# Patient Record
Sex: Female | Born: 1940
Health system: Southern US, Community
[De-identification: ages and names within clinical notes are randomized; demographics above are authoritative.]

## PROBLEM LIST (undated history)

## (undated) DIAGNOSIS — E0590 Thyrotoxicosis, unspecified without thyrotoxic crisis or storm: Secondary | ICD-10-CM

## (undated) DIAGNOSIS — M1990 Unspecified osteoarthritis, unspecified site: Secondary | ICD-10-CM

## (undated) DIAGNOSIS — I5020 Unspecified systolic (congestive) heart failure: Secondary | ICD-10-CM

## (undated) DIAGNOSIS — I4891 Unspecified atrial fibrillation: Secondary | ICD-10-CM

## (undated) DIAGNOSIS — E785 Hyperlipidemia, unspecified: Secondary | ICD-10-CM

## (undated) DIAGNOSIS — E669 Obesity, unspecified: Secondary | ICD-10-CM

## (undated) DIAGNOSIS — K219 Gastro-esophageal reflux disease without esophagitis: Secondary | ICD-10-CM

## (undated) DIAGNOSIS — C50919 Malignant neoplasm of unspecified site of unspecified female breast: Secondary | ICD-10-CM

## (undated) DIAGNOSIS — H269 Unspecified cataract: Secondary | ICD-10-CM

## (undated) DIAGNOSIS — I10 Essential (primary) hypertension: Secondary | ICD-10-CM

## (undated) HISTORY — DX: Unspecified cataract: H26.9

## (undated) HISTORY — DX: Thyrotoxicosis, unspecified without thyrotoxic crisis or storm: E05.90

## (undated) HISTORY — DX: Obesity, unspecified: E66.9

## (undated) HISTORY — DX: Essential (primary) hypertension: I10

## (undated) HISTORY — DX: Unspecified osteoarthritis, unspecified site: M19.90

## (undated) HISTORY — DX: Malignant neoplasm of unspecified site of unspecified female breast: C50.919

## (undated) HISTORY — DX: Gastro-esophageal reflux disease without esophagitis: K21.9

## (undated) HISTORY — DX: Hyperlipidemia, unspecified: E78.5

---
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
## 2005-08-09 HISTORY — PX: REPLACEMENT TOTAL KNEE: SUR1224

## 2006-07-07 ENCOUNTER — Inpatient Hospital Stay (HOSPITAL_COMMUNITY): Admission: RE | Admit: 2006-07-07 | Discharge: 2006-07-10 | Payer: Self-pay | Admitting: Specialist

## 2008-07-09 HISTORY — PX: BREAST LUMPECTOMY: SHX2

## 2008-07-09 HISTORY — PX: MASTECTOMY, PARTIAL: SHX709

## 2008-07-15 ENCOUNTER — Encounter: Admission: RE | Admit: 2008-07-15 | Discharge: 2008-07-15 | Payer: Self-pay | Admitting: Family Medicine

## 2008-07-23 ENCOUNTER — Encounter: Admission: RE | Admit: 2008-07-23 | Discharge: 2008-07-23 | Payer: Self-pay | Admitting: Family Medicine

## 2008-08-22 ENCOUNTER — Ambulatory Visit: Admission: RE | Admit: 2008-08-22 | Discharge: 2008-10-17 | Payer: Self-pay | Admitting: Radiation Oncology

## 2010-08-30 ENCOUNTER — Encounter: Payer: Self-pay | Admitting: Family Medicine

## 2010-12-25 NOTE — Op Note (Signed)
NAMESALOMA, CADENA                ACCOUNT NO.:  [id number]   MEDICAL RECORD NO.:  [id number]          PATIENT TYPE:  INP   LOCATION:  0007                         FACILITY:  [facility]   PHYSICIAN:  [name], M.D.DATE OF BIRTH:  06-Oct-1940   DATE OF PROCEDURE:  07/07/2006  DATE OF DISCHARGE:                               OPERATIVE REPORT   INDICATION FOR PROCEDURE:  [age]-year-old female with right knee  pain and dysfunction, not responding to conservative treatment are the  indications for the patient's procedure and anesthesia being required.   PREOPERATIVE DIAGNOSIS:  Right knee end-stage osteoarthritis.   POSTOPERATIVE DIAGNOSIS:  Right knee end-stage osteoarthritis.   PROCEDURE:  Right total knee arthroplasty.   SURGEON:  [name], M.D.   ASSISTANT:  Jaquelyn Bitter. Chabon, P.A.-C   ANESTHESIA:  Spinal with Duramorph.   ESTIMATED BLOOD LOSS:  Less than 100 mL.   DRAINS:  None.   COMPLICATIONS:  None.   TOURNIQUET TIME:  An hour and 30 minutes at 350 mmHg.   COMPLICATIONS:  None.   DISPOSITION:  To PACU, stable.   OPERATIVE DETAILS:  The patient was counseled in the holding area and  correct side was identified.  IV was started and antibiotics were given.  She was taken to the OR, where the spinal anesthetic was administered.  Foley catheter was placed utilizing sterile technique by the OR  circulating nurse.  All extremities were well-padded and bumped and  right knee examined, 5-degree flexion contraction, flexion to 110  degree, morbidly obese with difficulty in flexing the knee past 110  degrees secondary to her body habitus.  She was elevated.  She was  prepped with DuraPrep and draped in sterile fashion.  The same was  esmarched and tourniquet was inflated to 350 mmHg due to the large body  size.  A straight midline incision was made through the skin and  subcutaneous tissue and subcutaneous flaps were developed.  A medial  parapatellar arthrotomy was performed.  Proximal and medial soft tissue  were released.  There was a varus malalignment.  Knee was flex.  Patella  was not everted; it was retracted out of the way.  End-stage arthritis,  bone-against-bone contact.  Cruciate ligaments were resected.  Starter  hole was made and femoral canal was irrigated until effluent was clear.  Intramedullary rod was gently placed.  I chose a 5-degree valgus cut  with an 11-mm cut off the distal femur to reflect the contracture.  The  distal femur was found to be a size #4.  Rotation marks were set and the  distal femoral cutting block was applied.  Medial and lateral menisci  were removed under direct visualization.  Geniculate vessels were  coagulated.  Posterior neurovascular structures were thought of and  protected throughout the entire case.  Again, cruciate ligaments had  been resected.  Tibial eminence was resected and osteophytes were  removed.  The proximal tibia was found to be a size 4.  Central aspect  was noted.  Reamer was utilized with step reamer, then the canal was  irrigated till  the effluent was clear.  The intramedullary rod was  gently placed.  I chose a 0-degree slope and a 10-mm cut, based upon the  lateral size, which was the least efficient.  Posteromedial and  posterolateral femoral osteophytes were removed under direct  visualization.  At this time with flexion and extension blocks, we had  well-balanced flexion and extension gaps.  Tibial baseplate was applied  and rotation coverage was set.  Reamer and punch were utilized.  Femoral  box cut was then prepared in standard fashion.  At this time a size 4  femur, size 4 tibia and 12.5 insert with good range of motion and soft  tissue balance with flexion and extension.  The patella was found to be  a size 32.  Appropriate amount of bone was resected, locking holes were  made and patella was applied.  Patella tracking was anatomic.  All  trials  were now removed.  Knee was irrigated with pulsatile lavage.  Using modern cement technique, all components were cemented into place,  a size 4 femur, size 4 tibia, 12.5 trial insert, 32 patella.  Cement had  cured and excess cement was removed.  We did a 15-mm trial and a 15-mm  posterior-stabilized rotating platform tibial insert with excellent  range of motion and soft tissue balance.  Trial was then removed and the  knee was then again irrigated.  Excess cement was removed and again  copiously irrigated and a final rotating platform, posterior-stabilized  15-mm tibial insert was implanted.  We now had a well-balanced, well-  aligned knee with patellofemoral tracking, anatomic.  A medium Hemovac  drains were placed.  A sequential closure of the layers was done.  The  arthrotomy was closed with Vicryl, subcu closed with 2 layers of Vicryl  due to her large body habitus and deep adipose tissue and skin closed  with subcu Monocryl sutures.  Steri-Strips were applied, drain hooked to  suction, sterile dressings applied and tourniquet deflated.  At the end  of the case, the patient had normal circulation to the foot and ankle.  Ice pack and knee immobilizer were applied with full extension.  She  tolerated surgery well with no complications.  Sponge and needle counts  were corrected.  She was taken from the operating room in stable  condition.   To decrease surgical time and to help with retraction due to her large  body habitus, the assistance of Mr. Jodene Nam, P.A.-C was needed.           ______________________________  [name], M.D.     RAC/MEDQ  D:  07/07/2006  T:  07/08/2006  Job:  [id number]

## 2010-12-25 NOTE — H&P (Signed)
[name], [name] NO.:  [id number]   MEDICAL RECORD NO.:  [id number]          PATIENT TYPE:   LOCATION:                                 FACILITY:   PHYSICIAN:  Sara Leach, M.D.DATE OF BIRTH:  1941-06-16   DATE OF ADMISSION:  DATE OF DISCHARGE:                                HISTORY & PHYSICAL   CHIEF COMPLAINT:  End-stage osteoarthritis right knee.   BRIEF HISTORY:  This is a 70 year old lady with a history of end-stage  osteoarthritis of her right knee which has failed conservative management to  alleviate her pain.  Due to continued pain and difficulty with activities of  daily living, the patient is now scheduled for total knee arthroplasty of  the right knee.  The surgery, risks, benefits, and aftercare were discussed  in detail with the patient; questions invited and answered.  She has had  medical clearance by her medical doctor who is Dr. Selinda Leach and surgery  to go ahead as scheduled.   PAST MEDICAL HISTORY/ALLERGIES:  Drug allergies none.   CURRENT MEDICATIONS:  1. Glucosamine.  2. Levothyroxine 50 mcg per day.  3. Lisinopril/hydrochlorothiazide 20/12.51 daily.  4. Pepcid AC 10 mg one daily.  5. Tylenol.   MEDICAL ILLNESSES INCLUDE:  1. Hypertension.  2. Hypothyroidism.  3. Reflux.   SURGICAL HISTORY INCLUDES:  A D&C.   FAMILY HISTORY:  Positive for coronary disease, cancer, and hypertension.   SOCIAL HISTORY:  The patient is married.  She is retired.  She does not  smoke and drinks a glass of wine occasionally.   REVIEW OF SYSTEMS:  __________ negative for vision or dizziness.  PULMONARY:  No shortness breath, PND, or orthopnea.  CARDIOVASCULAR: No chest pain or  palpitations.  GI:  Positive for GERD.  Negative for ulcers.  GU: Negative  or urinary tract difficulty.  MUSCULOSKELETAL:  As positive in the HPI.   PHYSICAL EXAM:  VITAL SIGNS:  BP 160/90, respirations 18, pulse 78 and  regular.  GENERAL APPEARANCE:  This  is an obese lady in no acute distress.  HEENT: Head normocephalic.  Nose patent.  Ears patent.  Pupils equal round  and react to light.  Throat without injection.  NECK:  Supple without adenopathy.  Carotids 2+ without bruits.  CHEST:  Clear to auscultation.  No rales or rhonchi.  Respirations 18.  HEART:  Regular rate and rhythm at 78 beats per without murmur.  ABDOMEN:  Soft with active bowel sounds.  No masses or organomegaly.  NEUROLOGIC:  Patient alert and oriented to time, place, and person.  Cranial  nerves II-XII grossly intact.  EXTREMITIES:  Showed the right knee with a 4-degrees flexion contracture  with further flexion to 110 degrees.  Neurovascular status is intact; and  dorsalis pedis and posterior tibialis pulses are 2+.   X-RAYS:  Show end-stage osteoarthritis right knee.   IMPRESSION:  End-stage osteoarthritis right knee.   PLAN:  Total knee arthroplasty right knee.      Sara Leach, P.A.    ______________________________  Sara Leach, M.D.  SJC/MEDQ  D:  06/15/2006  T:  06/15/2006  Job:  [id number]

## 2010-12-25 NOTE — Discharge Summary (Signed)
NAMEARTHURINE, OLEARY                ACCOUNT NO.:  [id number]   MEDICAL RECORD NO.:  [id number]          PATIENT TYPE:  INP   LOCATION:  1508                         FACILITY:  [facility]   PHYSICIAN:  [name], M.D.DATE OF BIRTH:  05/20/1941   DATE OF ADMISSION:  07/07/2006  DATE OF DISCHARGE:  07/10/2006                               DISCHARGE SUMMARY   ADMITTING DIAGNOSIS:  End-stage osteoarthritis right knee.   DISCHARGE DIAGNOSIS:  End-stage osteoarthritis right knee.   OPERATION:  Total knee arthroplasty right knee.   BRIEF HISTORY:  This 70 year old lady with history of end-stage  osteoarthritis which failed conservative management to alleviate her  pain after discussion of treatment, benefits, risks, and options, the  patient now scheduled for total knee arthroplasty. The patient obtained  medical clearance per medical doctor, Dr. [name]. The surgery to  go ahead as scheduled.   LABORATORY VALUES:  Admission CBC within normal limits with the  exception of platelets slightly elevated at 376. Hemoglobin and  hematocrit reached a low of 10 and 28.9 on the second. White count was  mildly elevated at 10.5 on the 30th and went back to normal on the  first. Also on the 30th she had slightly elevated neutrophil count at 85  __________ . Admission PT/PTT within normal limits. Admission CMET  within normal limits. The patient ran some mildly elevated glucose with  a high at 153. Also had slightly low potassium on the second at 2.9.  Urinalysis on the 21st showed a small amount of hemoglobin, cloudy  appearance. Repeat on the 30th showed a trace hemoglobin with a turbid  appearance. There were also many epithelial cells, rare bacteria.   COURSE IN THE HOSPITAL:  The patient tolerated the operative procedure  well. First postoperative day she was feeling okay.  Vital signs were  stable. She was afebrile. Urine output was slightly low and slightly  cloudy. Hemovac  drainage was 350. BMET within normal limits with the  exception of glucose of 153. Drain was removed without difficulty.  Calves were negative. She was given a 250 cc bolus. IV fluids were  increased, and a urine was sent for culture and sensitivity. Second  postoperative day she was doing well. Vital signs were stable. She was  voiding okay. Neurovascular was normal. She was doing well on her  Lovenox. Dressing was changed, and wound was benign.  Hemoglobin/hematocrit remained at acceptable levels. The patient was  doing well in physical therapy, and on December 2 feeling good, eating  well, doing very well in physical therapy with vital signs stable and  afebrile. Wound benign. Calves negative. The patient was subsequently  discharged home for follow-up in the office.   CONDITION ON DISCHARGE:  Improved.   DISCHARGE MEDICATIONS:  1. Lovenox 30 mg subcu b.i.d. for total of 10 days postop.  2. Percocet 1-2 q.4-6 h. p.r.n. pain.  3. Robaxin 500, one p.o. q.8 h. p.r.n. spasm.   Return to the office in two weeks for recheck or call sooner p.r.n.  problems. She is to continue with her home physical therapy  and home  health with Genevieve Norlander on discharge.      Jaquelyn Bitter. Chabon, P.A.    ______________________________  [name], M.D.    SJC/MEDQ  D:  08/18/2006  T:  08/18/2006  Job:  [id number]

## 2012-12-12 ENCOUNTER — Encounter (INDEPENDENT_AMBULATORY_CARE_PROVIDER_SITE_OTHER): Payer: Self-pay | Admitting: Internal Medicine

## 2012-12-12 DIAGNOSIS — Z170 Estrogen receptor positive status [ER+]: Secondary | ICD-10-CM

## 2012-12-12 DIAGNOSIS — C50919 Malignant neoplasm of unspecified site of unspecified female breast: Secondary | ICD-10-CM

## 2013-01-18 ENCOUNTER — Other Ambulatory Visit: Payer: Self-pay | Admitting: *Deleted

## 2013-01-18 NOTE — Telephone Encounter (Signed)
Opened encounter in error  

## 2015-09-30 DIAGNOSIS — C50911 Malignant neoplasm of unspecified site of right female breast: Secondary | ICD-10-CM | POA: Diagnosis not present

## 2015-09-30 DIAGNOSIS — I10 Essential (primary) hypertension: Secondary | ICD-10-CM | POA: Diagnosis not present

## 2015-09-30 DIAGNOSIS — K219 Gastro-esophageal reflux disease without esophagitis: Secondary | ICD-10-CM | POA: Diagnosis not present

## 2015-09-30 DIAGNOSIS — E559 Vitamin D deficiency, unspecified: Secondary | ICD-10-CM | POA: Diagnosis not present

## 2015-09-30 DIAGNOSIS — E039 Hypothyroidism, unspecified: Secondary | ICD-10-CM | POA: Diagnosis not present

## 2015-09-30 DIAGNOSIS — E871 Hypo-osmolality and hyponatremia: Secondary | ICD-10-CM | POA: Diagnosis not present

## 2015-10-06 DIAGNOSIS — Z6841 Body Mass Index (BMI) 40.0 and over, adult: Secondary | ICD-10-CM | POA: Diagnosis not present

## 2015-10-06 DIAGNOSIS — E559 Vitamin D deficiency, unspecified: Secondary | ICD-10-CM | POA: Diagnosis not present

## 2015-10-06 DIAGNOSIS — I10 Essential (primary) hypertension: Secondary | ICD-10-CM | POA: Diagnosis not present

## 2015-10-06 DIAGNOSIS — E871 Hypo-osmolality and hyponatremia: Secondary | ICD-10-CM | POA: Diagnosis not present

## 2015-10-06 DIAGNOSIS — C50911 Malignant neoplasm of unspecified site of right female breast: Secondary | ICD-10-CM | POA: Diagnosis not present

## 2015-10-06 DIAGNOSIS — M179 Osteoarthritis of knee, unspecified: Secondary | ICD-10-CM | POA: Diagnosis not present

## 2015-10-06 DIAGNOSIS — K219 Gastro-esophageal reflux disease without esophagitis: Secondary | ICD-10-CM | POA: Diagnosis not present

## 2015-10-06 DIAGNOSIS — Z96651 Presence of right artificial knee joint: Secondary | ICD-10-CM | POA: Diagnosis not present

## 2015-10-06 DIAGNOSIS — E039 Hypothyroidism, unspecified: Secondary | ICD-10-CM | POA: Diagnosis not present

## 2016-04-05 DIAGNOSIS — M1990 Unspecified osteoarthritis, unspecified site: Secondary | ICD-10-CM | POA: Diagnosis not present

## 2016-04-05 DIAGNOSIS — I10 Essential (primary) hypertension: Secondary | ICD-10-CM | POA: Diagnosis not present

## 2016-04-05 DIAGNOSIS — E871 Hypo-osmolality and hyponatremia: Secondary | ICD-10-CM | POA: Diagnosis not present

## 2016-04-05 DIAGNOSIS — K219 Gastro-esophageal reflux disease without esophagitis: Secondary | ICD-10-CM | POA: Diagnosis not present

## 2016-04-05 DIAGNOSIS — E559 Vitamin D deficiency, unspecified: Secondary | ICD-10-CM | POA: Diagnosis not present

## 2016-04-05 DIAGNOSIS — C50911 Malignant neoplasm of unspecified site of right female breast: Secondary | ICD-10-CM | POA: Diagnosis not present

## 2016-04-05 DIAGNOSIS — E039 Hypothyroidism, unspecified: Secondary | ICD-10-CM | POA: Diagnosis not present

## 2016-04-08 DIAGNOSIS — C50911 Malignant neoplasm of unspecified site of right female breast: Secondary | ICD-10-CM | POA: Diagnosis not present

## 2016-04-08 DIAGNOSIS — M179 Osteoarthritis of knee, unspecified: Secondary | ICD-10-CM | POA: Diagnosis not present

## 2016-04-08 DIAGNOSIS — Z6841 Body Mass Index (BMI) 40.0 and over, adult: Secondary | ICD-10-CM | POA: Diagnosis not present

## 2016-04-08 DIAGNOSIS — K219 Gastro-esophageal reflux disease without esophagitis: Secondary | ICD-10-CM | POA: Diagnosis not present

## 2016-04-08 DIAGNOSIS — Z0001 Encounter for general adult medical examination with abnormal findings: Secondary | ICD-10-CM | POA: Diagnosis not present

## 2016-04-08 DIAGNOSIS — I10 Essential (primary) hypertension: Secondary | ICD-10-CM | POA: Diagnosis not present

## 2016-04-08 DIAGNOSIS — Z23 Encounter for immunization: Secondary | ICD-10-CM | POA: Diagnosis not present

## 2016-04-08 DIAGNOSIS — E039 Hypothyroidism, unspecified: Secondary | ICD-10-CM | POA: Diagnosis not present

## 2016-04-09 DIAGNOSIS — Z923 Personal history of irradiation: Secondary | ICD-10-CM | POA: Diagnosis not present

## 2016-04-09 DIAGNOSIS — Z9889 Other specified postprocedural states: Secondary | ICD-10-CM | POA: Diagnosis not present

## 2016-04-09 DIAGNOSIS — Z1231 Encounter for screening mammogram for malignant neoplasm of breast: Secondary | ICD-10-CM | POA: Diagnosis not present

## 2016-05-05 DIAGNOSIS — Z01419 Encounter for gynecological examination (general) (routine) without abnormal findings: Secondary | ICD-10-CM | POA: Diagnosis not present

## 2016-05-05 DIAGNOSIS — Z6837 Body mass index (BMI) 37.0-37.9, adult: Secondary | ICD-10-CM | POA: Diagnosis not present

## 2016-10-12 DIAGNOSIS — E559 Vitamin D deficiency, unspecified: Secondary | ICD-10-CM | POA: Diagnosis not present

## 2016-10-12 DIAGNOSIS — I10 Essential (primary) hypertension: Secondary | ICD-10-CM | POA: Diagnosis not present

## 2016-10-12 DIAGNOSIS — E871 Hypo-osmolality and hyponatremia: Secondary | ICD-10-CM | POA: Diagnosis not present

## 2016-10-12 DIAGNOSIS — E039 Hypothyroidism, unspecified: Secondary | ICD-10-CM | POA: Diagnosis not present

## 2016-10-14 DIAGNOSIS — C50911 Malignant neoplasm of unspecified site of right female breast: Secondary | ICD-10-CM | POA: Diagnosis not present

## 2016-10-14 DIAGNOSIS — K219 Gastro-esophageal reflux disease without esophagitis: Secondary | ICD-10-CM | POA: Diagnosis not present

## 2016-10-14 DIAGNOSIS — E039 Hypothyroidism, unspecified: Secondary | ICD-10-CM | POA: Diagnosis not present

## 2016-10-14 DIAGNOSIS — I10 Essential (primary) hypertension: Secondary | ICD-10-CM | POA: Diagnosis not present

## 2017-01-13 DIAGNOSIS — J209 Acute bronchitis, unspecified: Secondary | ICD-10-CM | POA: Diagnosis not present

## 2017-01-13 DIAGNOSIS — R05 Cough: Secondary | ICD-10-CM | POA: Diagnosis not present

## 2017-01-13 DIAGNOSIS — R5383 Other fatigue: Secondary | ICD-10-CM | POA: Diagnosis not present

## 2017-01-13 DIAGNOSIS — Z6838 Body mass index (BMI) 38.0-38.9, adult: Secondary | ICD-10-CM | POA: Diagnosis not present

## 2017-01-17 DIAGNOSIS — R5383 Other fatigue: Secondary | ICD-10-CM | POA: Diagnosis not present

## 2017-01-17 DIAGNOSIS — Z6838 Body mass index (BMI) 38.0-38.9, adult: Secondary | ICD-10-CM | POA: Diagnosis not present

## 2017-01-17 DIAGNOSIS — R05 Cough: Secondary | ICD-10-CM | POA: Diagnosis not present

## 2017-01-17 DIAGNOSIS — J209 Acute bronchitis, unspecified: Secondary | ICD-10-CM | POA: Diagnosis not present

## 2017-01-19 DIAGNOSIS — R918 Other nonspecific abnormal finding of lung field: Secondary | ICD-10-CM | POA: Diagnosis not present

## 2017-02-02 DIAGNOSIS — M47816 Spondylosis without myelopathy or radiculopathy, lumbar region: Secondary | ICD-10-CM | POA: Diagnosis not present

## 2017-02-02 DIAGNOSIS — M9903 Segmental and somatic dysfunction of lumbar region: Secondary | ICD-10-CM | POA: Diagnosis not present

## 2017-02-07 DIAGNOSIS — M9903 Segmental and somatic dysfunction of lumbar region: Secondary | ICD-10-CM | POA: Diagnosis not present

## 2017-02-07 DIAGNOSIS — M47816 Spondylosis without myelopathy or radiculopathy, lumbar region: Secondary | ICD-10-CM | POA: Diagnosis not present

## 2017-02-10 DIAGNOSIS — M47816 Spondylosis without myelopathy or radiculopathy, lumbar region: Secondary | ICD-10-CM | POA: Diagnosis not present

## 2017-02-10 DIAGNOSIS — M9903 Segmental and somatic dysfunction of lumbar region: Secondary | ICD-10-CM | POA: Diagnosis not present

## 2017-02-15 DIAGNOSIS — M9903 Segmental and somatic dysfunction of lumbar region: Secondary | ICD-10-CM | POA: Diagnosis not present

## 2017-02-15 DIAGNOSIS — M47816 Spondylosis without myelopathy or radiculopathy, lumbar region: Secondary | ICD-10-CM | POA: Diagnosis not present

## 2017-02-21 DIAGNOSIS — M47816 Spondylosis without myelopathy or radiculopathy, lumbar region: Secondary | ICD-10-CM | POA: Diagnosis not present

## 2017-02-21 DIAGNOSIS — M9903 Segmental and somatic dysfunction of lumbar region: Secondary | ICD-10-CM | POA: Diagnosis not present

## 2017-02-24 DIAGNOSIS — M47816 Spondylosis without myelopathy or radiculopathy, lumbar region: Secondary | ICD-10-CM | POA: Diagnosis not present

## 2017-02-24 DIAGNOSIS — M9903 Segmental and somatic dysfunction of lumbar region: Secondary | ICD-10-CM | POA: Diagnosis not present

## 2017-03-01 DIAGNOSIS — M47816 Spondylosis without myelopathy or radiculopathy, lumbar region: Secondary | ICD-10-CM | POA: Diagnosis not present

## 2017-03-01 DIAGNOSIS — M9903 Segmental and somatic dysfunction of lumbar region: Secondary | ICD-10-CM | POA: Diagnosis not present

## 2017-03-08 DIAGNOSIS — M9903 Segmental and somatic dysfunction of lumbar region: Secondary | ICD-10-CM | POA: Diagnosis not present

## 2017-03-08 DIAGNOSIS — M47816 Spondylosis without myelopathy or radiculopathy, lumbar region: Secondary | ICD-10-CM | POA: Diagnosis not present

## 2017-03-14 DIAGNOSIS — M47816 Spondylosis without myelopathy or radiculopathy, lumbar region: Secondary | ICD-10-CM | POA: Diagnosis not present

## 2017-03-14 DIAGNOSIS — M9903 Segmental and somatic dysfunction of lumbar region: Secondary | ICD-10-CM | POA: Diagnosis not present

## 2017-03-21 DIAGNOSIS — M9903 Segmental and somatic dysfunction of lumbar region: Secondary | ICD-10-CM | POA: Diagnosis not present

## 2017-03-21 DIAGNOSIS — M47816 Spondylosis without myelopathy or radiculopathy, lumbar region: Secondary | ICD-10-CM | POA: Diagnosis not present

## 2017-03-28 DIAGNOSIS — M47816 Spondylosis without myelopathy or radiculopathy, lumbar region: Secondary | ICD-10-CM | POA: Diagnosis not present

## 2017-03-28 DIAGNOSIS — M9903 Segmental and somatic dysfunction of lumbar region: Secondary | ICD-10-CM | POA: Diagnosis not present

## 2017-04-04 DIAGNOSIS — M47816 Spondylosis without myelopathy or radiculopathy, lumbar region: Secondary | ICD-10-CM | POA: Diagnosis not present

## 2017-04-04 DIAGNOSIS — M9903 Segmental and somatic dysfunction of lumbar region: Secondary | ICD-10-CM | POA: Diagnosis not present

## 2017-04-07 DIAGNOSIS — E871 Hypo-osmolality and hyponatremia: Secondary | ICD-10-CM | POA: Diagnosis not present

## 2017-04-07 DIAGNOSIS — C50911 Malignant neoplasm of unspecified site of right female breast: Secondary | ICD-10-CM | POA: Diagnosis not present

## 2017-04-07 DIAGNOSIS — E039 Hypothyroidism, unspecified: Secondary | ICD-10-CM | POA: Diagnosis not present

## 2017-04-07 DIAGNOSIS — E559 Vitamin D deficiency, unspecified: Secondary | ICD-10-CM | POA: Diagnosis not present

## 2017-04-14 DIAGNOSIS — I10 Essential (primary) hypertension: Secondary | ICD-10-CM | POA: Diagnosis not present

## 2017-04-14 DIAGNOSIS — Z0001 Encounter for general adult medical examination with abnormal findings: Secondary | ICD-10-CM | POA: Diagnosis not present

## 2017-04-14 DIAGNOSIS — C50911 Malignant neoplasm of unspecified site of right female breast: Secondary | ICD-10-CM | POA: Diagnosis not present

## 2017-04-14 DIAGNOSIS — E559 Vitamin D deficiency, unspecified: Secondary | ICD-10-CM | POA: Diagnosis not present

## 2017-04-18 DIAGNOSIS — M9903 Segmental and somatic dysfunction of lumbar region: Secondary | ICD-10-CM | POA: Diagnosis not present

## 2017-04-18 DIAGNOSIS — M47816 Spondylosis without myelopathy or radiculopathy, lumbar region: Secondary | ICD-10-CM | POA: Diagnosis not present

## 2017-05-04 DIAGNOSIS — M9903 Segmental and somatic dysfunction of lumbar region: Secondary | ICD-10-CM | POA: Diagnosis not present

## 2017-05-04 DIAGNOSIS — M47816 Spondylosis without myelopathy or radiculopathy, lumbar region: Secondary | ICD-10-CM | POA: Diagnosis not present

## 2017-05-30 DIAGNOSIS — M9903 Segmental and somatic dysfunction of lumbar region: Secondary | ICD-10-CM | POA: Diagnosis not present

## 2017-05-30 DIAGNOSIS — M47816 Spondylosis without myelopathy or radiculopathy, lumbar region: Secondary | ICD-10-CM | POA: Diagnosis not present

## 2017-05-31 DIAGNOSIS — M1712 Unilateral primary osteoarthritis, left knee: Secondary | ICD-10-CM | POA: Diagnosis not present

## 2017-06-13 DIAGNOSIS — Z1231 Encounter for screening mammogram for malignant neoplasm of breast: Secondary | ICD-10-CM | POA: Diagnosis not present

## 2017-06-13 DIAGNOSIS — Z01419 Encounter for gynecological examination (general) (routine) without abnormal findings: Secondary | ICD-10-CM | POA: Diagnosis not present

## 2017-06-13 DIAGNOSIS — Z124 Encounter for screening for malignant neoplasm of cervix: Secondary | ICD-10-CM | POA: Diagnosis not present

## 2017-06-27 DIAGNOSIS — M9903 Segmental and somatic dysfunction of lumbar region: Secondary | ICD-10-CM | POA: Diagnosis not present

## 2017-06-27 DIAGNOSIS — M47816 Spondylosis without myelopathy or radiculopathy, lumbar region: Secondary | ICD-10-CM | POA: Diagnosis not present

## 2017-07-04 DIAGNOSIS — I10 Essential (primary) hypertension: Secondary | ICD-10-CM | POA: Diagnosis not present

## 2017-07-04 DIAGNOSIS — E871 Hypo-osmolality and hyponatremia: Secondary | ICD-10-CM | POA: Diagnosis not present

## 2017-07-04 DIAGNOSIS — Z6835 Body mass index (BMI) 35.0-35.9, adult: Secondary | ICD-10-CM | POA: Diagnosis not present

## 2017-07-06 DIAGNOSIS — M1712 Unilateral primary osteoarthritis, left knee: Secondary | ICD-10-CM | POA: Diagnosis not present

## 2017-07-13 DIAGNOSIS — M1712 Unilateral primary osteoarthritis, left knee: Secondary | ICD-10-CM | POA: Diagnosis not present

## 2017-07-13 DIAGNOSIS — E871 Hypo-osmolality and hyponatremia: Secondary | ICD-10-CM | POA: Diagnosis not present

## 2017-07-13 DIAGNOSIS — E039 Hypothyroidism, unspecified: Secondary | ICD-10-CM | POA: Diagnosis not present

## 2017-07-13 DIAGNOSIS — E559 Vitamin D deficiency, unspecified: Secondary | ICD-10-CM | POA: Diagnosis not present

## 2017-07-13 DIAGNOSIS — C50911 Malignant neoplasm of unspecified site of right female breast: Secondary | ICD-10-CM | POA: Diagnosis not present

## 2017-07-14 DIAGNOSIS — M47816 Spondylosis without myelopathy or radiculopathy, lumbar region: Secondary | ICD-10-CM | POA: Diagnosis not present

## 2017-07-14 DIAGNOSIS — M9903 Segmental and somatic dysfunction of lumbar region: Secondary | ICD-10-CM | POA: Diagnosis not present

## 2017-07-20 DIAGNOSIS — M1712 Unilateral primary osteoarthritis, left knee: Secondary | ICD-10-CM | POA: Diagnosis not present

## 2017-08-08 DIAGNOSIS — Z780 Asymptomatic menopausal state: Secondary | ICD-10-CM | POA: Diagnosis not present

## 2017-10-10 DIAGNOSIS — E039 Hypothyroidism, unspecified: Secondary | ICD-10-CM | POA: Diagnosis not present

## 2017-10-10 DIAGNOSIS — E871 Hypo-osmolality and hyponatremia: Secondary | ICD-10-CM | POA: Diagnosis not present

## 2017-10-10 DIAGNOSIS — C50911 Malignant neoplasm of unspecified site of right female breast: Secondary | ICD-10-CM | POA: Diagnosis not present

## 2017-10-10 DIAGNOSIS — I10 Essential (primary) hypertension: Secondary | ICD-10-CM | POA: Diagnosis not present

## 2017-10-13 DIAGNOSIS — E559 Vitamin D deficiency, unspecified: Secondary | ICD-10-CM | POA: Diagnosis not present

## 2017-10-13 DIAGNOSIS — M179 Osteoarthritis of knee, unspecified: Secondary | ICD-10-CM | POA: Diagnosis not present

## 2017-10-13 DIAGNOSIS — Z96651 Presence of right artificial knee joint: Secondary | ICD-10-CM | POA: Diagnosis not present

## 2017-10-13 DIAGNOSIS — C50911 Malignant neoplasm of unspecified site of right female breast: Secondary | ICD-10-CM | POA: Diagnosis not present

## 2017-12-14 DIAGNOSIS — M170 Bilateral primary osteoarthritis of knee: Secondary | ICD-10-CM | POA: Diagnosis not present

## 2017-12-14 DIAGNOSIS — M1711 Unilateral primary osteoarthritis, right knee: Secondary | ICD-10-CM | POA: Diagnosis not present

## 2017-12-14 DIAGNOSIS — M1712 Unilateral primary osteoarthritis, left knee: Secondary | ICD-10-CM | POA: Diagnosis not present

## 2017-12-28 DIAGNOSIS — M47816 Spondylosis without myelopathy or radiculopathy, lumbar region: Secondary | ICD-10-CM | POA: Diagnosis not present

## 2017-12-28 DIAGNOSIS — M5442 Lumbago with sciatica, left side: Secondary | ICD-10-CM | POA: Diagnosis not present

## 2017-12-28 DIAGNOSIS — M9903 Segmental and somatic dysfunction of lumbar region: Secondary | ICD-10-CM | POA: Diagnosis not present

## 2018-01-04 DIAGNOSIS — M47816 Spondylosis without myelopathy or radiculopathy, lumbar region: Secondary | ICD-10-CM | POA: Diagnosis not present

## 2018-01-04 DIAGNOSIS — M9903 Segmental and somatic dysfunction of lumbar region: Secondary | ICD-10-CM | POA: Diagnosis not present

## 2018-01-04 DIAGNOSIS — M5442 Lumbago with sciatica, left side: Secondary | ICD-10-CM | POA: Diagnosis not present

## 2018-01-09 DIAGNOSIS — E039 Hypothyroidism, unspecified: Secondary | ICD-10-CM | POA: Diagnosis not present

## 2018-01-09 DIAGNOSIS — C50911 Malignant neoplasm of unspecified site of right female breast: Secondary | ICD-10-CM | POA: Diagnosis not present

## 2018-01-09 DIAGNOSIS — M1990 Unspecified osteoarthritis, unspecified site: Secondary | ICD-10-CM | POA: Diagnosis not present

## 2018-01-09 DIAGNOSIS — E876 Hypokalemia: Secondary | ICD-10-CM | POA: Diagnosis not present

## 2018-01-18 DIAGNOSIS — E876 Hypokalemia: Secondary | ICD-10-CM | POA: Diagnosis not present

## 2018-01-18 DIAGNOSIS — E039 Hypothyroidism, unspecified: Secondary | ICD-10-CM | POA: Diagnosis not present

## 2018-01-18 DIAGNOSIS — I10 Essential (primary) hypertension: Secondary | ICD-10-CM | POA: Diagnosis not present

## 2018-01-18 DIAGNOSIS — E871 Hypo-osmolality and hyponatremia: Secondary | ICD-10-CM | POA: Diagnosis not present

## 2018-02-08 DIAGNOSIS — M1712 Unilateral primary osteoarthritis, left knee: Secondary | ICD-10-CM | POA: Diagnosis not present

## 2018-02-15 DIAGNOSIS — M1712 Unilateral primary osteoarthritis, left knee: Secondary | ICD-10-CM | POA: Diagnosis not present

## 2018-02-22 DIAGNOSIS — M1712 Unilateral primary osteoarthritis, left knee: Secondary | ICD-10-CM | POA: Diagnosis not present

## 2018-03-09 DIAGNOSIS — M9903 Segmental and somatic dysfunction of lumbar region: Secondary | ICD-10-CM | POA: Diagnosis not present

## 2018-03-09 DIAGNOSIS — M5442 Lumbago with sciatica, left side: Secondary | ICD-10-CM | POA: Diagnosis not present

## 2018-03-09 DIAGNOSIS — M47816 Spondylosis without myelopathy or radiculopathy, lumbar region: Secondary | ICD-10-CM | POA: Diagnosis not present

## 2018-03-13 DIAGNOSIS — M5442 Lumbago with sciatica, left side: Secondary | ICD-10-CM | POA: Diagnosis not present

## 2018-03-13 DIAGNOSIS — H25813 Combined forms of age-related cataract, bilateral: Secondary | ICD-10-CM | POA: Diagnosis not present

## 2018-03-13 DIAGNOSIS — M9903 Segmental and somatic dysfunction of lumbar region: Secondary | ICD-10-CM | POA: Diagnosis not present

## 2018-03-13 DIAGNOSIS — M47816 Spondylosis without myelopathy or radiculopathy, lumbar region: Secondary | ICD-10-CM | POA: Diagnosis not present

## 2018-03-16 DIAGNOSIS — M5442 Lumbago with sciatica, left side: Secondary | ICD-10-CM | POA: Diagnosis not present

## 2018-03-16 DIAGNOSIS — M9903 Segmental and somatic dysfunction of lumbar region: Secondary | ICD-10-CM | POA: Diagnosis not present

## 2018-03-16 DIAGNOSIS — M47816 Spondylosis without myelopathy or radiculopathy, lumbar region: Secondary | ICD-10-CM | POA: Diagnosis not present

## 2018-03-20 DIAGNOSIS — M5442 Lumbago with sciatica, left side: Secondary | ICD-10-CM | POA: Diagnosis not present

## 2018-03-20 DIAGNOSIS — M47816 Spondylosis without myelopathy or radiculopathy, lumbar region: Secondary | ICD-10-CM | POA: Diagnosis not present

## 2018-03-20 DIAGNOSIS — M9903 Segmental and somatic dysfunction of lumbar region: Secondary | ICD-10-CM | POA: Diagnosis not present

## 2018-03-23 DIAGNOSIS — M5442 Lumbago with sciatica, left side: Secondary | ICD-10-CM | POA: Diagnosis not present

## 2018-03-23 DIAGNOSIS — M9903 Segmental and somatic dysfunction of lumbar region: Secondary | ICD-10-CM | POA: Diagnosis not present

## 2018-03-23 DIAGNOSIS — M47816 Spondylosis without myelopathy or radiculopathy, lumbar region: Secondary | ICD-10-CM | POA: Diagnosis not present

## 2018-03-27 DIAGNOSIS — M5442 Lumbago with sciatica, left side: Secondary | ICD-10-CM | POA: Diagnosis not present

## 2018-03-27 DIAGNOSIS — M9903 Segmental and somatic dysfunction of lumbar region: Secondary | ICD-10-CM | POA: Diagnosis not present

## 2018-03-27 DIAGNOSIS — M47816 Spondylosis without myelopathy or radiculopathy, lumbar region: Secondary | ICD-10-CM | POA: Diagnosis not present

## 2018-04-03 DIAGNOSIS — M47816 Spondylosis without myelopathy or radiculopathy, lumbar region: Secondary | ICD-10-CM | POA: Diagnosis not present

## 2018-04-03 DIAGNOSIS — M9903 Segmental and somatic dysfunction of lumbar region: Secondary | ICD-10-CM | POA: Diagnosis not present

## 2018-04-03 DIAGNOSIS — M5442 Lumbago with sciatica, left side: Secondary | ICD-10-CM | POA: Diagnosis not present

## 2018-04-11 DIAGNOSIS — M5442 Lumbago with sciatica, left side: Secondary | ICD-10-CM | POA: Diagnosis not present

## 2018-04-11 DIAGNOSIS — M47816 Spondylosis without myelopathy or radiculopathy, lumbar region: Secondary | ICD-10-CM | POA: Diagnosis not present

## 2018-04-11 DIAGNOSIS — M9903 Segmental and somatic dysfunction of lumbar region: Secondary | ICD-10-CM | POA: Diagnosis not present

## 2018-04-14 DIAGNOSIS — M47816 Spondylosis without myelopathy or radiculopathy, lumbar region: Secondary | ICD-10-CM | POA: Diagnosis not present

## 2018-04-14 DIAGNOSIS — M5442 Lumbago with sciatica, left side: Secondary | ICD-10-CM | POA: Diagnosis not present

## 2018-04-14 DIAGNOSIS — M9903 Segmental and somatic dysfunction of lumbar region: Secondary | ICD-10-CM | POA: Diagnosis not present

## 2018-05-12 DIAGNOSIS — M47816 Spondylosis without myelopathy or radiculopathy, lumbar region: Secondary | ICD-10-CM | POA: Diagnosis not present

## 2018-05-12 DIAGNOSIS — M9903 Segmental and somatic dysfunction of lumbar region: Secondary | ICD-10-CM | POA: Diagnosis not present

## 2018-05-12 DIAGNOSIS — M5442 Lumbago with sciatica, left side: Secondary | ICD-10-CM | POA: Diagnosis not present

## 2018-07-03 DIAGNOSIS — Z1231 Encounter for screening mammogram for malignant neoplasm of breast: Secondary | ICD-10-CM | POA: Diagnosis not present

## 2018-07-07 DIAGNOSIS — E559 Vitamin D deficiency, unspecified: Secondary | ICD-10-CM | POA: Diagnosis not present

## 2018-07-07 DIAGNOSIS — E039 Hypothyroidism, unspecified: Secondary | ICD-10-CM | POA: Diagnosis not present

## 2018-07-07 DIAGNOSIS — R5383 Other fatigue: Secondary | ICD-10-CM | POA: Diagnosis not present

## 2018-07-07 DIAGNOSIS — M179 Osteoarthritis of knee, unspecified: Secondary | ICD-10-CM | POA: Diagnosis not present

## 2018-07-11 DIAGNOSIS — Z0001 Encounter for general adult medical examination with abnormal findings: Secondary | ICD-10-CM | POA: Diagnosis not present

## 2018-07-11 DIAGNOSIS — E039 Hypothyroidism, unspecified: Secondary | ICD-10-CM | POA: Diagnosis not present

## 2018-07-11 DIAGNOSIS — C50911 Malignant neoplasm of unspecified site of right female breast: Secondary | ICD-10-CM | POA: Diagnosis not present

## 2018-07-11 DIAGNOSIS — I10 Essential (primary) hypertension: Secondary | ICD-10-CM | POA: Diagnosis not present

## 2018-10-10 DIAGNOSIS — E871 Hypo-osmolality and hyponatremia: Secondary | ICD-10-CM | POA: Diagnosis not present

## 2018-10-10 DIAGNOSIS — E039 Hypothyroidism, unspecified: Secondary | ICD-10-CM | POA: Diagnosis not present

## 2018-12-25 DIAGNOSIS — H43823 Vitreomacular adhesion, bilateral: Secondary | ICD-10-CM | POA: Diagnosis not present

## 2018-12-25 DIAGNOSIS — H25813 Combined forms of age-related cataract, bilateral: Secondary | ICD-10-CM | POA: Diagnosis not present

## 2019-01-03 DIAGNOSIS — M9903 Segmental and somatic dysfunction of lumbar region: Secondary | ICD-10-CM | POA: Diagnosis not present

## 2019-01-03 DIAGNOSIS — M5442 Lumbago with sciatica, left side: Secondary | ICD-10-CM | POA: Diagnosis not present

## 2019-01-03 DIAGNOSIS — M47816 Spondylosis without myelopathy or radiculopathy, lumbar region: Secondary | ICD-10-CM | POA: Diagnosis not present

## 2019-01-05 DIAGNOSIS — E871 Hypo-osmolality and hyponatremia: Secondary | ICD-10-CM | POA: Diagnosis not present

## 2019-01-05 DIAGNOSIS — K219 Gastro-esophageal reflux disease without esophagitis: Secondary | ICD-10-CM | POA: Diagnosis not present

## 2019-01-05 DIAGNOSIS — E039 Hypothyroidism, unspecified: Secondary | ICD-10-CM | POA: Diagnosis not present

## 2019-01-05 DIAGNOSIS — I10 Essential (primary) hypertension: Secondary | ICD-10-CM | POA: Diagnosis not present

## 2019-01-05 DIAGNOSIS — M1712 Unilateral primary osteoarthritis, left knee: Secondary | ICD-10-CM | POA: Diagnosis not present

## 2019-01-08 DIAGNOSIS — I4891 Unspecified atrial fibrillation: Secondary | ICD-10-CM

## 2019-01-08 DIAGNOSIS — M5442 Lumbago with sciatica, left side: Secondary | ICD-10-CM | POA: Diagnosis not present

## 2019-01-08 DIAGNOSIS — M47816 Spondylosis without myelopathy or radiculopathy, lumbar region: Secondary | ICD-10-CM | POA: Diagnosis not present

## 2019-01-08 DIAGNOSIS — M9903 Segmental and somatic dysfunction of lumbar region: Secondary | ICD-10-CM | POA: Diagnosis not present

## 2019-01-08 DIAGNOSIS — I5020 Unspecified systolic (congestive) heart failure: Secondary | ICD-10-CM

## 2019-01-08 HISTORY — DX: Unspecified systolic (congestive) heart failure: I50.20

## 2019-01-08 HISTORY — DX: Unspecified atrial fibrillation: I48.91

## 2019-01-09 ENCOUNTER — Encounter: Payer: Self-pay | Admitting: Internal Medicine

## 2019-01-09 DIAGNOSIS — M1712 Unilateral primary osteoarthritis, left knee: Secondary | ICD-10-CM | POA: Diagnosis not present

## 2019-01-09 DIAGNOSIS — E039 Hypothyroidism, unspecified: Secondary | ICD-10-CM | POA: Diagnosis not present

## 2019-01-09 DIAGNOSIS — I10 Essential (primary) hypertension: Secondary | ICD-10-CM | POA: Diagnosis not present

## 2019-01-09 DIAGNOSIS — E871 Hypo-osmolality and hyponatremia: Secondary | ICD-10-CM | POA: Diagnosis not present

## 2019-01-09 DIAGNOSIS — E876 Hypokalemia: Secondary | ICD-10-CM | POA: Diagnosis not present

## 2019-01-09 DIAGNOSIS — Z6836 Body mass index (BMI) 36.0-36.9, adult: Secondary | ICD-10-CM | POA: Diagnosis not present

## 2019-01-11 DIAGNOSIS — I10 Essential (primary) hypertension: Secondary | ICD-10-CM | POA: Diagnosis not present

## 2019-01-11 DIAGNOSIS — E871 Hypo-osmolality and hyponatremia: Secondary | ICD-10-CM | POA: Diagnosis not present

## 2019-01-11 DIAGNOSIS — M1712 Unilateral primary osteoarthritis, left knee: Secondary | ICD-10-CM | POA: Diagnosis not present

## 2019-01-11 DIAGNOSIS — E559 Vitamin D deficiency, unspecified: Secondary | ICD-10-CM | POA: Diagnosis not present

## 2019-01-15 DIAGNOSIS — M47816 Spondylosis without myelopathy or radiculopathy, lumbar region: Secondary | ICD-10-CM | POA: Diagnosis not present

## 2019-01-15 DIAGNOSIS — M5442 Lumbago with sciatica, left side: Secondary | ICD-10-CM | POA: Diagnosis not present

## 2019-01-15 DIAGNOSIS — M9903 Segmental and somatic dysfunction of lumbar region: Secondary | ICD-10-CM | POA: Diagnosis not present

## 2019-01-17 DIAGNOSIS — I361 Nonrheumatic tricuspid (valve) insufficiency: Secondary | ICD-10-CM | POA: Diagnosis not present

## 2019-01-17 DIAGNOSIS — I4891 Unspecified atrial fibrillation: Secondary | ICD-10-CM | POA: Diagnosis not present

## 2019-01-17 DIAGNOSIS — I071 Rheumatic tricuspid insufficiency: Secondary | ICD-10-CM | POA: Diagnosis not present

## 2019-01-17 DIAGNOSIS — I358 Other nonrheumatic aortic valve disorders: Secondary | ICD-10-CM | POA: Diagnosis not present

## 2019-01-17 DIAGNOSIS — I700 Atherosclerosis of aorta: Secondary | ICD-10-CM | POA: Diagnosis not present

## 2019-01-17 DIAGNOSIS — I340 Nonrheumatic mitral (valve) insufficiency: Secondary | ICD-10-CM | POA: Diagnosis not present

## 2019-01-29 DIAGNOSIS — M9903 Segmental and somatic dysfunction of lumbar region: Secondary | ICD-10-CM | POA: Diagnosis not present

## 2019-01-29 DIAGNOSIS — M5442 Lumbago with sciatica, left side: Secondary | ICD-10-CM | POA: Diagnosis not present

## 2019-01-29 DIAGNOSIS — M47816 Spondylosis without myelopathy or radiculopathy, lumbar region: Secondary | ICD-10-CM | POA: Diagnosis not present

## 2019-02-01 DIAGNOSIS — M5442 Lumbago with sciatica, left side: Secondary | ICD-10-CM | POA: Diagnosis not present

## 2019-02-01 DIAGNOSIS — M9903 Segmental and somatic dysfunction of lumbar region: Secondary | ICD-10-CM | POA: Diagnosis not present

## 2019-02-01 DIAGNOSIS — M47816 Spondylosis without myelopathy or radiculopathy, lumbar region: Secondary | ICD-10-CM | POA: Diagnosis not present

## 2019-02-05 DIAGNOSIS — Z6838 Body mass index (BMI) 38.0-38.9, adult: Secondary | ICD-10-CM | POA: Diagnosis not present

## 2019-02-05 DIAGNOSIS — I5021 Acute systolic (congestive) heart failure: Secondary | ICD-10-CM | POA: Diagnosis not present

## 2019-02-05 DIAGNOSIS — R600 Localized edema: Secondary | ICD-10-CM | POA: Diagnosis not present

## 2019-02-05 DIAGNOSIS — I4891 Unspecified atrial fibrillation: Secondary | ICD-10-CM | POA: Diagnosis not present

## 2019-02-05 DIAGNOSIS — I10 Essential (primary) hypertension: Secondary | ICD-10-CM | POA: Diagnosis not present

## 2019-02-07 DIAGNOSIS — L820 Inflamed seborrheic keratosis: Secondary | ICD-10-CM | POA: Diagnosis not present

## 2019-02-07 DIAGNOSIS — L821 Other seborrheic keratosis: Secondary | ICD-10-CM | POA: Diagnosis not present

## 2019-02-08 DIAGNOSIS — M47816 Spondylosis without myelopathy or radiculopathy, lumbar region: Secondary | ICD-10-CM | POA: Diagnosis not present

## 2019-02-08 DIAGNOSIS — M5442 Lumbago with sciatica, left side: Secondary | ICD-10-CM | POA: Diagnosis not present

## 2019-02-08 DIAGNOSIS — M9903 Segmental and somatic dysfunction of lumbar region: Secondary | ICD-10-CM | POA: Diagnosis not present

## 2019-02-22 DIAGNOSIS — M5442 Lumbago with sciatica, left side: Secondary | ICD-10-CM | POA: Diagnosis not present

## 2019-02-22 DIAGNOSIS — M9903 Segmental and somatic dysfunction of lumbar region: Secondary | ICD-10-CM | POA: Diagnosis not present

## 2019-02-22 DIAGNOSIS — M47816 Spondylosis without myelopathy or radiculopathy, lumbar region: Secondary | ICD-10-CM | POA: Diagnosis not present

## 2019-02-28 ENCOUNTER — Encounter (HOSPITAL_COMMUNITY): Payer: Self-pay | Admitting: *Deleted

## 2019-02-28 ENCOUNTER — Other Ambulatory Visit: Payer: Self-pay

## 2019-02-28 ENCOUNTER — Ambulatory Visit (HOSPITAL_COMMUNITY)
Admission: RE | Admit: 2019-02-28 | Discharge: 2019-02-28 | Disposition: A | Discharge status: Home / Self Care | Payer: Medicare Other | Source: Ambulatory Visit | Attending: Internal Medicine | Admitting: Internal Medicine

## 2019-02-28 ENCOUNTER — Encounter (HOSPITAL_COMMUNITY): Payer: Self-pay | Admitting: Internal Medicine

## 2019-02-28 VITALS — BP 164/96 | HR 99 | Wt 217.4 lb

## 2019-02-28 DIAGNOSIS — E785 Hyperlipidemia, unspecified: Secondary | ICD-10-CM | POA: Insufficient documentation

## 2019-02-28 DIAGNOSIS — I5021 Acute systolic (congestive) heart failure: Secondary | ICD-10-CM | POA: Diagnosis not present

## 2019-02-28 DIAGNOSIS — Z7901 Long term (current) use of anticoagulants: Secondary | ICD-10-CM | POA: Insufficient documentation

## 2019-02-28 DIAGNOSIS — I110 Hypertensive heart disease with heart failure: Secondary | ICD-10-CM | POA: Diagnosis not present

## 2019-02-28 DIAGNOSIS — E039 Hypothyroidism, unspecified: Secondary | ICD-10-CM | POA: Diagnosis not present

## 2019-02-28 DIAGNOSIS — M5442 Lumbago with sciatica, left side: Secondary | ICD-10-CM | POA: Diagnosis not present

## 2019-02-28 DIAGNOSIS — R0683 Snoring: Secondary | ICD-10-CM | POA: Diagnosis not present

## 2019-02-28 DIAGNOSIS — I4891 Unspecified atrial fibrillation: Secondary | ICD-10-CM | POA: Insufficient documentation

## 2019-02-28 DIAGNOSIS — Z8249 Family history of ischemic heart disease and other diseases of the circulatory system: Secondary | ICD-10-CM | POA: Insufficient documentation

## 2019-02-28 DIAGNOSIS — M1990 Unspecified osteoarthritis, unspecified site: Secondary | ICD-10-CM | POA: Diagnosis not present

## 2019-02-28 DIAGNOSIS — M47816 Spondylosis without myelopathy or radiculopathy, lumbar region: Secondary | ICD-10-CM | POA: Diagnosis not present

## 2019-02-28 DIAGNOSIS — Z79899 Other long term (current) drug therapy: Secondary | ICD-10-CM | POA: Diagnosis not present

## 2019-02-28 DIAGNOSIS — I5022 Chronic systolic (congestive) heart failure: Secondary | ICD-10-CM | POA: Diagnosis not present

## 2019-02-28 DIAGNOSIS — I480 Paroxysmal atrial fibrillation: Secondary | ICD-10-CM | POA: Diagnosis not present

## 2019-02-28 DIAGNOSIS — I4819 Other persistent atrial fibrillation: Secondary | ICD-10-CM

## 2019-02-28 DIAGNOSIS — M9903 Segmental and somatic dysfunction of lumbar region: Secondary | ICD-10-CM | POA: Diagnosis not present

## 2019-02-28 LAB — COMPREHENSIVE METABOLIC PANEL
ALT: 21 U/L (ref 0–44)
AST: 26 U/L (ref 15–41)
Albumin: 3.6 g/dL (ref 3.5–5.0)
Alkaline Phosphatase: 55 U/L (ref 38–126)
Anion gap: 7 (ref 5–15)
BUN: 14 mg/dL (ref 8–23)
CO2: 25 mmol/L (ref 22–32)
Calcium: 9.9 mg/dL (ref 8.9–10.3)
Chloride: 106 mmol/L (ref 98–111)
Creatinine, Ser: 0.93 mg/dL (ref 0.44–1.00)
GFR calc Af Amer: >60 mL/min (ref >60)
GFR calc non Af Amer: 59 mL/min — ABNORMAL LOW (ref >60)
Glucose, Bld: 104 mg/dL — ABNORMAL HIGH (ref 70–99)
Potassium: 4 mmol/L (ref 3.5–5.1)
Sodium: 138 mmol/L (ref 135–145)
Total Bilirubin: 0.6 mg/dL (ref 0.3–1.2)
Total Protein: 6.9 g/dL (ref 6.5–8.1)

## 2019-02-28 LAB — CBC
HCT: 40.6 % (ref 36.0–46.0)
Hemoglobin: 13.4 g/dL (ref 12.0–15.0)
MCH: 29.9 pg (ref 26.0–34.0)
MCHC: 33 g/dL (ref 30.0–36.0)
MCV: 90.6 fL (ref 80.0–100.0)
Platelets: 279 10*3/uL (ref 150–400)
RBC: 4.48 MIL/uL (ref 3.87–5.11)
RDW: 14.2 % (ref 11.5–15.5)
WBC: 5.4 10*3/uL (ref 4.0–10.5)
nRBC: 0 % (ref 0.0–0.2)

## 2019-02-28 LAB — BRAIN NATRIURETIC PEPTIDE: B Natriuretic Peptide: 259.7 pg/mL — ABNORMAL HIGH (ref 0.0–100.0)

## 2019-02-28 MED ORDER — POTASSIUM CHLORIDE CRYS ER 20 MEQ PO TBCR: 20.0000 meq | EXTENDED_RELEASE_TABLET | Freq: Every day | ORAL | 6 refills | Status: DC

## 2019-02-28 MED ORDER — FUROSEMIDE 40 MG PO TABS: 40.0000 mg | ORAL_TABLET | Freq: Every day | ORAL | 6 refills | Status: DC

## 2019-02-28 MED ORDER — METOPROLOL SUCCINATE ER 50 MG PO TB24: 50.0000 mg | ORAL_TABLET | Freq: Every day | ORAL | 6 refills | Status: DC

## 2019-02-28 MED ORDER — ENTRESTO 24-26 MG PO TABS: 1.0000 | ORAL_TABLET | Freq: Two times a day (BID) | ORAL | 3 refills | Status: DC

## 2019-02-28 NOTE — Progress Notes (Signed)
  Date:  02/28/2019 STOP BANG RISK ASSESSMENT S (snore) Have you been told that you snore?     YES   T (tired) Are you often tired, fatigued, or sleepy during the day?   YES  O (obstruction) Do you stop breathing, choke, or gasp during sleep? NO   P (pressure) Do you have or are you being treated for high blood pressure? YES   B (BMI) Is your body index greater than 35 kg/m? YES   A (age) Are you 78 years old or older? YES  N (neck) Do you have a neck circumference greater than 16 inches?     G (gender) Are you a female? NO   TOTAL STOP/BANG "YES" ANSWERS 5                                                                       For Office Use Only              Procedure Order Form    YES to 3+ Stop Bang questions OR two clinical symptoms - patient qualifies for WatchPAT (CPT 95800)     Submit: This Form + Patient Face Sheet + Clinical Note via CloudPAT or Fax: [phone]         Clinical Notes: Will consult Sleep Specialist and refer for management of therapy due to patient increased risk of Sleep Apnea. Ordering a sleep study due to the following two clinical symptoms: Excessive daytime sleepiness G47.10 /  Loud snoring R06.83  History of high blood pressure R03.0 / Insomnia G47.00    I understand that I am proceeding with a home sleep apnea test as ordered by my treating physician. I understand that untreated sleep apnea is a serious cardiovascular risk factor and it is my responsibility to perform the test and seek management for sleep apnea. I will be contacted with the results and be managed for sleep apnea by a local sleep physician. I will be receiving equipment and further instructions from [facility]. I shall promptly ship back the equipment via the included mailing label. I understand my insurance will be billed for the test and as the patient I am responsible for any insurance related out-of-pocket costs incurred. I have been provided with written instructions and can call for  additional video or telephonic instruction, with 24-hour availability of qualified personnel to answer any questions: Patient Help Desk [phone].  Patient Signature ______________________________________________________   Date______________________ Patient Telemedicine Verbal Consent

## 2019-02-28 NOTE — Progress Notes (Signed)
ADVANCED HF CLINIC CONSULT NOTE  Referring Physician: [facility] Care: Dr. Dalia Heading Primary Cardiologist: New  HPI:  Sara Leach is a 78 y/o woman with h/o obesity, breast CA (2009) - s/p XRT and partial mastectomy (no chemo), HTN , HL who is referred by Dr. Robin Searing for further evaluation of recent onset AF and systolic HF.   First found to be in AF during regular physical exam in 4/20. In June got echo at [facility] showed EF 40% with anterior and AS HK, mild MR/TR. + marked biatrial enlargement. Started on lisinopril and Toprol and Eliquis. Referred for further eval.    Says her BP has never been really high. Denies snoring or witnessed apnea. Denies palpitations. Can do all ADLs without problem gets SOB with stairs. + recent onset LE edema. Was on lasix for a few weeks but now out for 3 weeks.    Review of Systems: [y] = yes, [ ]  = no   General: Weight gain [ ] ; Weight loss [ ] ; Anorexia [ ] ; Fatigue Blue.Reese ]; Fever [ ] ; Chills [ ] ; Weakness [ ]   Cardiac: Chest pain/pressure [ ] ; Resting SOB [ ] ; Exertional SOB [ y]; Orthopnea [ ] ; Pedal Edema [ ] ; Palpitations [ ] ; Syncope [ ] ; Presyncope [ ] ; Paroxysmal nocturnal dyspnea[ ]   Pulmonary: Cough [ ] ; Wheezing[ ] ; Hemoptysis[ ] ; Sputum [ ] ; Snoring [ ]   GI: Vomiting[ ] ; Dysphagia[ ] ; Melena[ ] ; Hematochezia [ ] ; Heartburn[ ] ; Abdominal pain [ ] ; Constipation [ ] ; Diarrhea [ ] ; BRBPR [ ]   GU: Hematuria[ ] ; Dysuria [ ] ; Nocturia[ ]   Vascular: Pain in legs with walking [ ] ; Pain in feet with lying flat [ ] ; Non-healing sores [ ] ; Stroke [ ] ; TIA [ ] ; Slurred speech [ ] ;  Neuro: Headaches[ ] ; Vertigo[ ] ; Seizures[ ] ; Paresthesias[ ] ;Blurred vision [ ] ; Diplopia [ ] ; Vision changes [ ]   Ortho/Skin: Arthritis Blue.Reese ]; Joint pain [ y]; Muscle pain [ ] ; Joint swelling [ ] ; Back Pain [ ] ; Rash [ ]   Psych: Depression[ ] ; Anxiety[ ]   Heme: Bleeding problems [ ] ; Clotting disorders [ ] ; Anemia [ ]   Endocrine: Diabetes [ ] ; Thyroid  dysfunction[ y]   Past Medical History:  Diagnosis Date  . Atrial fibrillation ([place]) 01/2019  . Breast cancer ([place])   . Cataracts, bilateral   . GERD (gastroesophageal reflux disease)   . Hyperlipidemia   . Hypertension   . Hyperthyroidism   . Obesity   . Osteoarthritis   . Systolic heart failure ([place]) 01/2019   EF 40% by echo done 01/17/2019    Current Outpatient Medications  Medication Sig Dispense Refill  . albuterol (VENTOLIN HFA) 108 (90 Base) MCG/ACT inhaler Inhale 2 puffs into the lungs every 6 (six) hours as needed for wheezing or shortness of breath.    Marland Kitchen apixaban (ELIQUIS) 5 MG TABS tablet Take 5 mg by mouth 2 (two) times daily.    . calcium carbonate (OS-CAL) 600 MG tablet Take 600 mg by mouth daily.    . cholecalciferol (VITAMIN D3) 25 MCG (1000 UT) tablet Take 2,000 Units by mouth daily.    Marland Kitchen levothyroxine (SYNTHROID) 75 MCG tablet Take 75 mcg by mouth daily before breakfast.    . lisinopril (ZESTRIL) 10 MG tablet Take 10 mg by mouth daily.    . metoprolol succinate (TOPROL-XL) 25 MG 24 hr tablet Take 25 mg by mouth daily.    . Potassium 99 MG TABS Take by mouth daily.  No current facility-administered medications for this encounter.     Not on File    Social History   Socioeconomic History  . Marital status: Single    Spouse name: Not on file  . Number of children: Not on file  . Years of education: Not on file  . Highest education level: Not on file  Occupational History  . Not on file  Social Needs  . Financial resource strain: Not on file  . Food insecurity    Worry: Not on file    Inability: Not on file  . Transportation needs    Medical: Not on file    Non-medical: Not on file  Tobacco Use  . Smoking status: Never Smoker  . Smokeless tobacco: Never Used  Substance and Sexual Activity  . Alcohol use: Yes    Comment: occasionally has wine  . Drug use: Never  . Sexual activity: Not on file  Lifestyle  . Physical activity    Days per  week: Not on file    Minutes per session: Not on file  . Stress: Not on file  Relationships  . Social Herbalist on phone: Not on file    Gets together: Not on file    Attends religious service: Not on file    Active member of club or organization: Not on file    Attends meetings of clubs or organizations: Not on file    Relationship status: Not on file  . Intimate partner violence    Fear of current or ex partner: Not on file    Emotionally abused: Not on file    Physically abused: Not on file    Forced sexual activity: Not on file  Other Topics Concern  . Not on file  Social History Narrative  . Not on file      Family History  Problem Relation Age of Onset  . Ovarian cancer Mother   . Coronary artery disease Father   . AAA (abdominal aortic aneurysm) Father 36       cause of death  . Alzheimer's disease Sister   . Alzheimer's disease Brother   . Heart defect Son        VSD, casued death at 42 m old  . Alzheimer's disease Brother   . Alzheimer's disease Brother     Vitals:   02/28/19 1347  BP: (!) 164/96  Pulse: 99  SpO2: 98%  Weight: 98.6 kg (217 lb 6 oz)    PHYSICAL EXAM: General:  Well appearing. No respiratory difficulty HEENT: normal Neck: supple. JVP 9 . Carotids 2+ bilat; no bruits. No lymphadenopathy or thryomegaly appreciated. Cor: PMI nondisplaced. Irregular tachy. No rubs, gallops or murmurs. Lungs: clear Abdomen: obese soft, nontender, nondistended. No hepatosplenomegaly. No bruits or masses. Good bowel sounds. Extremities: no cyanosis, clubbing, rash, 2+ edema Neuro: alert & oriented x 3, cranial nerves grossly intact. moves all 4 extremities w/o difficulty. Affect pleasant.  ECG: AF 101 with iLBBB (189ms) + LVH. Lateral T wave flattening    ASSESSMENT & PLAN:  1. Acute systolic HF - Echo 9/38 [facility] EF 40% with anterior and AS HK, mild MR/TR. + marked biatrial enlargement.  - Etiology of LV dysfunction unclear. ? HTN, AF  or ischemic. Given severe biatrial enlargement consider restrictive CM from poorly controlled HTN - NYHA II - Volume status elevated.  - Will switch lisinopril to Entresto 24/26 bid (36-hour washout) - Increase Toprol to 50mg  daily - Start lasix 40 daily -  Eventually will start spiro - Check echo at [facility] - Will likely need cath at some point - Labs today - see back 2-3 weeks  2. PAF - recent onset. - by echo report seems like she may have restrictive CM with severe biatrial enlargement. Suspect this may be cause of AF - Rate remains elevated. Increase Toprol  - Continue Apixaban - Will not pursue DC-CV at this point as unlikely to hold NSR with LAE. Await echo results - Check sleep study  3. HTN - uncontrolled - switch lisinopril to [facility] 49/51 with 36 hour washout - other meds as above.   Glori Bickers, MD  2:54 PM

## 2019-02-28 NOTE — Patient Instructions (Signed)
Increase Metoprolol to 50 mg daily  STOP Lisinopril  Start Entresto 24/26 mg Twice daily STARTING ON Saturday 7/25 PM  Start Furosemide 40 mg daily  Start Potassium (k-dur) 20 meq daily  Labs done today  Your physician has requested that you have an echocardiogram. Echocardiography is a painless test that uses sound waves to create images of your heart. It provides your doctor with information about the size and shape of your heart and how well your heart's chambers and valves are working. This procedure takes approximately one hour. There are no restrictions for this procedure.  Your provider has recommended that you have a home sleep study.  [employer] is the company that provides these and will send the equipment right to your home with instructions on how to set it up.  Once you have completed the test you just dispose of the equipment, the information is automatically uploaded to [place].  IF you have any questions or issues with the equipment please call the company.  If your test was positive and you need a home CPAP machine you will be contacted by Dr Theodosia Blender office [facility]) to set this up.  Your physician recommends that you schedule a follow-up appointment in: 2-3 weeks  At the [facility], you and your health needs are our priority. As part of our continuing mission to provide you with exceptional heart care, we have created designated Provider Care Teams. These Care Teams include your primary Cardiologist (physician) and Advanced Practice Providers (APPs- Physician Assistants and Nurse Practitioners) who all work together to provide you with the care you need, when you need it.   You may see any of the following providers on your designated Care Team at your next follow up: Marland Kitchen Dr Glori Bickers . Dr Loralie Champagne . Darrick Grinder, NP

## 2019-02-28 NOTE — H&P (View-Only) (Signed)
ADVANCED HF CLINIC CONSULT NOTE  Referring Physician: [facility] Primary Care: Dr. Dalia Heading Primary Cardiologist: New  HPI:  Sara Leach is a 78 y/o woman with h/o obesity, breast CA (2009) - s/p XRT and partial mastectomy (no chemo), HTN , HL who is referred by Dr. Robin Searing for further evaluation of recent onset AF and systolic HF.   First found to be in AF during regular physical exam in 4/20. In June got echo at [facility] showed EF 40% with anterior and AS HK, mild MR/TR. + marked biatrial enlargement. Started on lisinopril and Toprol and Eliquis. Referred for further eval.    Says her BP has never been really high. Denies snoring or witnessed apnea. Denies palpitations. Can do all ADLs without problem gets SOB with stairs. + recent onset LE edema. Was on lasix for a few weeks but now out for 3 weeks.    Review of Systems: [y] = yes, [ ]  = no   General: Weight gain [ ] ; Weight loss [ ] ; Anorexia [ ] ; Fatigue Blue.Reese ]; Fever [ ] ; Chills [ ] ; Weakness [ ]   Cardiac: Chest pain/pressure [ ] ; Resting SOB [ ] ; Exertional SOB [ y]; Orthopnea [ ] ; Pedal Edema [ ] ; Palpitations [ ] ; Syncope [ ] ; Presyncope [ ] ; Paroxysmal nocturnal dyspnea[ ]   Pulmonary: Cough [ ] ; Wheezing[ ] ; Hemoptysis[ ] ; Sputum [ ] ; Snoring [ ]   GI: Vomiting[ ] ; Dysphagia[ ] ; Melena[ ] ; Hematochezia [ ] ; Heartburn[ ] ; Abdominal pain [ ] ; Constipation [ ] ; Diarrhea [ ] ; BRBPR [ ]   GU: Hematuria[ ] ; Dysuria [ ] ; Nocturia[ ]   Vascular: Pain in legs with walking [ ] ; Pain in feet with lying flat [ ] ; Non-healing sores [ ] ; Stroke [ ] ; TIA [ ] ; Slurred speech [ ] ;  Neuro: Headaches[ ] ; Vertigo[ ] ; Seizures[ ] ; Paresthesias[ ] ;Blurred vision [ ] ; Diplopia [ ] ; Vision changes [ ]   Ortho/Skin: Arthritis Blue.Reese ]; Joint pain [ y]; Muscle pain [ ] ; Joint swelling [ ] ; Back Pain [ ] ; Rash [ ]   Psych: Depression[ ] ; Anxiety[ ]   Heme: Bleeding problems [ ] ; Clotting disorders [ ] ; Anemia [ ]   Endocrine: Diabetes [ ] ; Thyroid  dysfunction[ y]   Past Medical History:  Diagnosis Date  . Atrial fibrillation ([place]) 01/2019  . Breast cancer ([place])   . Cataracts, bilateral   . GERD (gastroesophageal reflux disease)   . Hyperlipidemia   . Hypertension   . Hyperthyroidism   . Obesity   . Osteoarthritis   . Systolic heart failure ([place]) 01/2019   EF 40% by echo done 01/17/2019    Current Outpatient Medications  Medication Sig Dispense Refill  . albuterol (VENTOLIN HFA) 108 (90 Base) MCG/ACT inhaler Inhale 2 puffs into the lungs every 6 (six) hours as needed for wheezing or shortness of breath.    Marland Kitchen apixaban (ELIQUIS) 5 MG TABS tablet Take 5 mg by mouth 2 (two) times daily.    . calcium carbonate (OS-CAL) 600 MG tablet Take 600 mg by mouth daily.    . cholecalciferol (VITAMIN D3) 25 MCG (1000 UT) tablet Take 2,000 Units by mouth daily.    Marland Kitchen levothyroxine (SYNTHROID) 75 MCG tablet Take 75 mcg by mouth daily before breakfast.    . lisinopril (ZESTRIL) 10 MG tablet Take 10 mg by mouth daily.    . metoprolol succinate (TOPROL-XL) 25 MG 24 hr tablet Take 25 mg by mouth daily.    . Potassium 99 MG TABS Take by mouth daily.  No current facility-administered medications for this encounter.     Not on File    Social History   Socioeconomic History  . Marital status: Single    Spouse name: Not on file  . Number of children: Not on file  . Years of education: Not on file  . Highest education level: Not on file  Occupational History  . Not on file  Social Needs  . Financial resource strain: Not on file  . Food insecurity    Worry: Not on file    Inability: Not on file  . Transportation needs    Medical: Not on file    Non-medical: Not on file  Tobacco Use  . Smoking status: Never Smoker  . Smokeless tobacco: Never Used  Substance and Sexual Activity  . Alcohol use: Yes    Comment: occasionally has wine  . Drug use: Never  . Sexual activity: Not on file  Lifestyle  . Physical activity    Days per  week: Not on file    Minutes per session: Not on file  . Stress: Not on file  Relationships  . Social Herbalist on phone: Not on file    Gets together: Not on file    Attends religious service: Not on file    Active member of club or organization: Not on file    Attends meetings of clubs or organizations: Not on file    Relationship status: Not on file  . Intimate partner violence    Fear of current or ex partner: Not on file    Emotionally abused: Not on file    Physically abused: Not on file    Forced sexual activity: Not on file  Other Topics Concern  . Not on file  Social History Narrative  . Not on file      Family History  Problem Relation Age of Onset  . Ovarian cancer Mother   . Coronary artery disease Father   . AAA (abdominal aortic aneurysm) Father 54       cause of death  . Alzheimer's disease Sister   . Alzheimer's disease Brother   . Heart defect Son        VSD, casued death at 1 m old  . Alzheimer's disease Brother   . Alzheimer's disease Brother     Vitals:   02/28/19 1347  BP: (!) 164/96  Pulse: 99  SpO2: 98%  Weight: 98.6 kg (217 lb 6 oz)    PHYSICAL EXAM: General:  Well appearing. No respiratory difficulty HEENT: normal Neck: supple. JVP 9 . Carotids 2+ bilat; no bruits. No lymphadenopathy or thryomegaly appreciated. Cor: PMI nondisplaced. Irregular tachy. No rubs, gallops or murmurs. Lungs: clear Abdomen: obese soft, nontender, nondistended. No hepatosplenomegaly. No bruits or masses. Good bowel sounds. Extremities: no cyanosis, clubbing, rash, 2+ edema Neuro: alert & oriented x 3, cranial nerves grossly intact. moves all 4 extremities w/o difficulty. Affect pleasant.  ECG: AF 101 with iLBBB (132ms) + LVH. Lateral T wave flattening    ASSESSMENT & PLAN:  1. Acute systolic HF - Echo 1/61 [facility] EF 40% with anterior and AS HK, mild MR/TR. + marked biatrial enlargement.  - Etiology of LV dysfunction unclear. ? HTN, AF  or ischemic. Given severe biatrial enlargement consider restrictive CM from poorly controlled HTN - NYHA II - Volume status elevated.  - Will switch lisinopril to Entresto 24/26 bid (36-hour washout) - Increase Toprol to 50mg  daily - Start lasix 40 daily -  Eventually will start spiro - Check echo at [facility] - Will likely need cath at some point - Labs today - see back 2-3 weeks  2. PAF - recent onset. - by echo report seems like she may have restrictive CM with severe biatrial enlargement. Suspect this may be cause of AF - Rate remains elevated. Increase Toprol  - Continue Apixaban - Will not pursue DC-CV at this point as unlikely to hold NSR with LAE. Await echo results - Check sleep study  3. HTN - uncontrolled - switch lisinopril to [facility] 49/51 with 36 hour washout - other meds as above.   Glori Bickers, MD  2:54 PM

## 2019-03-01 ENCOUNTER — Ambulatory Visit (HOSPITAL_COMMUNITY)
Admission: RE | Admit: 2019-03-01 | Discharge: 2019-03-01 | Disposition: A | Discharge status: Home / Self Care | Payer: Medicare Other | Source: Ambulatory Visit | Attending: Internal Medicine | Admitting: Internal Medicine

## 2019-03-01 ENCOUNTER — Other Ambulatory Visit: Payer: Self-pay

## 2019-03-01 DIAGNOSIS — I5022 Chronic systolic (congestive) heart failure: Secondary | ICD-10-CM | POA: Diagnosis not present

## 2019-03-01 NOTE — Progress Notes (Signed)
*  PRELIMINARY RESULTS* Echocardiogram 2D Echocardiogram has been performed.  Sara Leach 03/01/2019, 9:40 AM

## 2019-03-02 DIAGNOSIS — E876 Hypokalemia: Secondary | ICD-10-CM | POA: Diagnosis not present

## 2019-03-02 DIAGNOSIS — K219 Gastro-esophageal reflux disease without esophagitis: Secondary | ICD-10-CM | POA: Diagnosis not present

## 2019-03-02 DIAGNOSIS — E871 Hypo-osmolality and hyponatremia: Secondary | ICD-10-CM | POA: Diagnosis not present

## 2019-03-02 DIAGNOSIS — I10 Essential (primary) hypertension: Secondary | ICD-10-CM | POA: Diagnosis not present

## 2019-03-05 ENCOUNTER — Other Ambulatory Visit (HOSPITAL_COMMUNITY)
Admission: RE | Admit: 2019-03-05 | Discharge: 2019-03-05 | Disposition: A | Discharge status: Home / Self Care | Payer: Medicare Other | Source: Ambulatory Visit | Attending: Internal Medicine | Admitting: Internal Medicine

## 2019-03-05 ENCOUNTER — Telehealth (HOSPITAL_COMMUNITY): Payer: Self-pay | Admitting: Cardiology

## 2019-03-05 ENCOUNTER — Other Ambulatory Visit (HOSPITAL_COMMUNITY): Payer: Self-pay

## 2019-03-05 DIAGNOSIS — Z20828 Contact with and (suspected) exposure to other viral communicable diseases: Secondary | ICD-10-CM | POA: Diagnosis not present

## 2019-03-05 DIAGNOSIS — I5022 Chronic systolic (congestive) heart failure: Secondary | ICD-10-CM

## 2019-03-05 MED ORDER — SODIUM CHLORIDE 0.9% FLUSH: 3.0000 mL | Freq: Two times a day (BID) | INTRAVENOUS | Status: DC

## 2019-03-05 NOTE — Telephone Encounter (Signed)
-----   Message from Jolaine Artist, MD sent at 03/01/2019  7:17 PM EDT ----- Looks ischemic. Will need R/L cath. Talk to me in am about timing. thanks

## 2019-03-05 NOTE — Progress Notes (Signed)
covid and cath orders placed.

## 2019-03-05 NOTE — Telephone Encounter (Signed)
Per DB ok to schedule cath Wed 7/30  Patient will need covid screening today  Patient reports she will speak with her daughter to make sure she can make it to the screening center before 3;45pm

## 2019-03-05 NOTE — Telephone Encounter (Signed)
Pt called back to confirm she has went for COVID screening  R/L heart cath scheduled for 03/07/19 @10am , patient arrival of 8am  Instructions reviewed verbally with grand-daughter and patient. Questions addressed.

## 2019-03-06 LAB — SARS CORONAVIRUS 2 (TAT 6-24 HRS): SARS Coronavirus 2: NEGATIVE

## 2019-03-07 ENCOUNTER — Ambulatory Visit (HOSPITAL_COMMUNITY)
Admission: RE | Admit: 2019-03-07 | Discharge: 2019-03-07 | Disposition: A | Discharge status: Home / Self Care | Payer: Medicare Other | Attending: Internal Medicine | Admitting: Internal Medicine

## 2019-03-07 ENCOUNTER — Encounter (HOSPITAL_COMMUNITY)
Admission: RE | Disposition: A | Discharge status: Home / Self Care | Payer: Self-pay | Source: Home / Self Care | Attending: Internal Medicine

## 2019-03-07 ENCOUNTER — Other Ambulatory Visit: Payer: Self-pay

## 2019-03-07 DIAGNOSIS — M1990 Unspecified osteoarthritis, unspecified site: Secondary | ICD-10-CM | POA: Diagnosis not present

## 2019-03-07 DIAGNOSIS — Z79890 Hormone replacement therapy (postmenopausal): Secondary | ICD-10-CM | POA: Diagnosis not present

## 2019-03-07 DIAGNOSIS — I428 Other cardiomyopathies: Secondary | ICD-10-CM | POA: Insufficient documentation

## 2019-03-07 DIAGNOSIS — Z853 Personal history of malignant neoplasm of breast: Secondary | ICD-10-CM | POA: Insufficient documentation

## 2019-03-07 DIAGNOSIS — E669 Obesity, unspecified: Secondary | ICD-10-CM | POA: Diagnosis not present

## 2019-03-07 DIAGNOSIS — E0590 Thyrotoxicosis, unspecified without thyrotoxic crisis or storm: Secondary | ICD-10-CM | POA: Insufficient documentation

## 2019-03-07 DIAGNOSIS — K219 Gastro-esophageal reflux disease without esophagitis: Secondary | ICD-10-CM | POA: Diagnosis not present

## 2019-03-07 DIAGNOSIS — Z8249 Family history of ischemic heart disease and other diseases of the circulatory system: Secondary | ICD-10-CM | POA: Insufficient documentation

## 2019-03-07 DIAGNOSIS — Z6833 Body mass index (BMI) 33.0-33.9, adult: Secondary | ICD-10-CM | POA: Insufficient documentation

## 2019-03-07 DIAGNOSIS — E785 Hyperlipidemia, unspecified: Secondary | ICD-10-CM | POA: Insufficient documentation

## 2019-03-07 DIAGNOSIS — I480 Paroxysmal atrial fibrillation: Secondary | ICD-10-CM | POA: Diagnosis not present

## 2019-03-07 DIAGNOSIS — Z79899 Other long term (current) drug therapy: Secondary | ICD-10-CM | POA: Diagnosis not present

## 2019-03-07 DIAGNOSIS — Z7901 Long term (current) use of anticoagulants: Secondary | ICD-10-CM | POA: Diagnosis not present

## 2019-03-07 DIAGNOSIS — I110 Hypertensive heart disease with heart failure: Secondary | ICD-10-CM | POA: Insufficient documentation

## 2019-03-07 DIAGNOSIS — Z923 Personal history of irradiation: Secondary | ICD-10-CM | POA: Insufficient documentation

## 2019-03-07 DIAGNOSIS — I5023 Acute on chronic systolic (congestive) heart failure: Secondary | ICD-10-CM | POA: Insufficient documentation

## 2019-03-07 DIAGNOSIS — Z9010 Acquired absence of unspecified breast and nipple: Secondary | ICD-10-CM | POA: Insufficient documentation

## 2019-03-07 DIAGNOSIS — I5022 Chronic systolic (congestive) heart failure: Secondary | ICD-10-CM

## 2019-03-07 HISTORY — PX: RIGHT/LEFT HEART CATH AND CORONARY ANGIOGRAPHY: CATH118266

## 2019-03-07 LAB — POCT I-STAT EG7
Acid-Base Excess: 3 mmol/L — ABNORMAL HIGH (ref 0.0–2.0)
Acid-Base Excess: 5 mmol/L — ABNORMAL HIGH (ref 0.0–2.0)
Bicarbonate: 25 mmol/L (ref 20.0–28.0)
Bicarbonate: 27.8 mmol/L (ref 20.0–28.0)
Calcium, Ion: 1.24 mmol/L (ref 1.15–1.40)
Calcium, Ion: 1.27 mmol/L (ref 1.15–1.40)
HCT: 39 % (ref 36.0–46.0)
HCT: 43 % (ref 36.0–46.0)
Hemoglobin: 13.3 g/dL (ref 12.0–15.0)
Hemoglobin: 14.6 g/dL (ref 12.0–15.0)
O2 Saturation: 67 %
O2 Saturation: 73 %
Potassium: 3.1 mmol/L — ABNORMAL LOW (ref 3.5–5.1)
Potassium: 3.5 mmol/L (ref 3.5–5.1)
Sodium: 139 mmol/L (ref 135–145)
Sodium: 142 mmol/L (ref 135–145)
TCO2: 26 mmol/L (ref 22–32)
TCO2: 29 mmol/L (ref 22–32)
pCO2, Ven: 30.2 mmHg — ABNORMAL LOW (ref 44.0–60.0)
pCO2, Ven: 33.6 mmHg — ABNORMAL LOW (ref 44.0–60.0)
pH, Ven: 7.526 — ABNORMAL HIGH (ref 7.250–7.430)
pH, Ven: 7.526 — ABNORMAL HIGH (ref 7.250–7.430)
pO2, Ven: 31 mmHg — CL (ref 32.0–45.0)
pO2, Ven: 33 mmHg (ref 32.0–45.0)

## 2019-03-07 LAB — POCT I-STAT 7, (LYTES, BLD GAS, ICA,H+H)
Acid-Base Excess: 3 mmol/L — ABNORMAL HIGH (ref 0.0–2.0)
Bicarbonate: 24.5 mmol/L (ref 20.0–28.0)
Calcium, Ion: 1.01 mmol/L — ABNORMAL LOW (ref 1.15–1.40)
HCT: 38 % (ref 36.0–46.0)
Hemoglobin: 12.9 g/dL (ref 12.0–15.0)
O2 Saturation: 100 %
Potassium: 3 mmol/L — ABNORMAL LOW (ref 3.5–5.1)
Sodium: 143 mmol/L (ref 135–145)
TCO2: 25 mmol/L (ref 22–32)
pCO2 arterial: 27.8 mmHg — ABNORMAL LOW (ref 32.0–48.0)
pH, Arterial: 7.554 — ABNORMAL HIGH (ref 7.350–7.450)
pO2, Arterial: 176 mmHg — ABNORMAL HIGH (ref 83.0–108.0)

## 2019-03-07 SURGERY — RIGHT/LEFT HEART CATH AND CORONARY ANGIOGRAPHY
Anesthesia: LOCAL

## 2019-03-07 MED ORDER — LIDOCAINE HCL (PF) 1 % IJ SOLN
INTRAMUSCULAR | Status: AC
  Filled 2019-03-07: qty 30

## 2019-03-07 MED ORDER — VERAPAMIL HCL 2.5 MG/ML IV SOLN
INTRAVENOUS | Status: DC | PRN
  Administered 2019-03-07: 10 mL via INTRA_ARTERIAL

## 2019-03-07 MED ORDER — SODIUM CHLORIDE 0.9% FLUSH: 3.0000 mL | INTRAVENOUS | Status: DC | PRN

## 2019-03-07 MED ORDER — SODIUM CHLORIDE 0.9% FLUSH: 3.0000 mL | Freq: Two times a day (BID) | INTRAVENOUS | Status: DC

## 2019-03-07 MED ORDER — SODIUM CHLORIDE 0.9 % IV SOLN
INTRAVENOUS | Status: DC
  Administered 2019-03-07: 09:00:00 via INTRAVENOUS

## 2019-03-07 MED ORDER — SODIUM CHLORIDE 0.9 % IV SOLN: 250.0000 mL | INTRAVENOUS | Status: DC | PRN

## 2019-03-07 MED ORDER — LIDOCAINE HCL (PF) 1 % IJ SOLN
INTRAMUSCULAR | Status: DC | PRN
  Administered 2019-03-07: 4 mL

## 2019-03-07 MED ORDER — HEPARIN (PORCINE) IN NACL 1000-0.9 UT/500ML-% IV SOLN
INTRAVENOUS | Status: DC | PRN
  Administered 2019-03-07 (×2): 500 mL

## 2019-03-07 MED ORDER — VERAPAMIL HCL 2.5 MG/ML IV SOLN
INTRAVENOUS | Status: AC
  Filled 2019-03-07: qty 2

## 2019-03-07 MED ORDER — HEPARIN SODIUM (PORCINE) 1000 UNIT/ML IJ SOLN
INTRAMUSCULAR | Status: DC | PRN
  Administered 2019-03-07: 4500 [IU] via INTRAVENOUS

## 2019-03-07 MED ORDER — SODIUM CHLORIDE 0.9 % IV SOLN: INTRAVENOUS | Status: AC

## 2019-03-07 MED ORDER — IOHEXOL 350 MG/ML SOLN
INTRAVENOUS | Status: DC | PRN
  Administered 2019-03-07: 30 mL via INTRAVENOUS

## 2019-03-07 MED ORDER — ASPIRIN 81 MG PO CHEW: 81.0000 mg | CHEWABLE_TABLET | ORAL | Status: DC

## 2019-03-07 MED ORDER — HEPARIN (PORCINE) IN NACL 1000-0.9 UT/500ML-% IV SOLN
INTRAVENOUS | Status: AC
  Filled 2019-03-07: qty 1000

## 2019-03-07 SURGICAL SUPPLY — 15 items
BAND CMPR LRG ZPHR (HEMOSTASIS) ×1
BAND ZEPHYR COMPRESS 30 LONG (HEMOSTASIS) ×1 IMPLANT
CATH 5FR JL3.5 JR4 ANG PIG MP (CATHETERS) ×1 IMPLANT
CATH BALLN WEDGE 5F 110CM (CATHETERS) ×2 IMPLANT
COVER DOME SNAP 22 D (MISCELLANEOUS) ×1 IMPLANT
GUIDEWIRE .025 260CM (WIRE) ×2 IMPLANT
GUIDEWIRE INQWIRE 1.5J.035X260 (WIRE) IMPLANT
INQWIRE 1.5J .035X260CM (WIRE) ×2
KIT HEART LEFT (KITS) ×1 IMPLANT
PACK CARDIAC CATHETERIZATION (CUSTOM PROCEDURE TRAY) ×2 IMPLANT
SHEATH RAIN 4/5FR (SHEATH) ×1 IMPLANT
SHEATH RAIN RADIAL 21G 6FR (SHEATH) ×1 IMPLANT
TRANSDUCER W/STOPCOCK (MISCELLANEOUS) ×2 IMPLANT
TUBING CIL FLEX 10 FLL-RA (TUBING) IMPLANT
WIRE HI TORQ VERSACORE-J 145CM (WIRE) ×1 IMPLANT

## 2019-03-07 NOTE — Discharge Instructions (Signed)
Radial Site Care ° °This sheet gives you information about how to care for yourself after your procedure. Your health care provider may also give you more specific instructions. If you have problems or questions, contact your health care provider. °What can I expect after the procedure? °After the procedure, it is common to have: °· Bruising and tenderness at the catheter insertion area. °Follow these instructions at home: °Medicines °· Take over-the-counter and prescription medicines only as told by your health care provider. °Insertion site care °· Follow instructions from your health care provider about how to take care of your insertion site. Make sure you: °? Wash your hands with soap and water before you change your bandage (dressing). If soap and water are not available, use hand sanitizer. °? Change your dressing as told by your health care provider. °? Leave stitches (sutures), skin glue, or adhesive strips in place. These skin closures may need to stay in place for 2 weeks or longer. If adhesive strip edges start to loosen and curl up, you may trim the loose edges. Do not remove adhesive strips completely unless your health care provider tells you to do that. °· Check your insertion site every day for signs of infection. Check for: °? Redness, swelling, or pain. °? Fluid or blood. °? Pus or a bad smell. °? Warmth. °· Do not take baths, swim, or use a hot tub until your health care provider approves. °· You may shower 24-48 hours after the procedure, or as directed by your health care provider. °? Remove the dressing and gently wash the site with plain soap and water. °? Pat the area dry with a clean towel. °? Do not rub the site. That could cause bleeding. °· Do not apply powder or lotion to the site. °Activity ° °· For 24 hours after the procedure, or as directed by your health care provider: °? Do not flex or bend the affected arm. °? Do not push or pull heavy objects with the affected arm. °? Do not  drive yourself home from the hospital or clinic. You may drive 24 hours after the procedure unless your health care provider tells you not to. °? Do not operate machinery or power tools. °· Do not lift anything that is heavier than 10 lb (4.5 kg), or the limit that you are told, until your health care provider says that it is safe. °· Ask your health care provider when it is okay to: °? Return to work or school. °? Resume usual physical activities or sports. °? Resume sexual activity. °General instructions °· If the catheter site starts to bleed, raise your arm and put firm pressure on the site. If the bleeding does not stop, get help right away. This is a medical emergency. °· If you went home on the same day as your procedure, a responsible adult should be with you for the first 24 hours after you arrive home. °· Keep all follow-up visits as told by your health care provider. This is important. °Contact a health care provider if: °· You have a fever. °· You have redness, swelling, or yellow drainage around your insertion site. °Get help right away if: °· You have unusual pain at the radial site. °· The catheter insertion area swells very fast. °· The insertion area is bleeding, and the bleeding does not stop when you hold steady pressure on the area. °· Your arm or hand becomes pale, cool, tingly, or numb. °These symptoms may represent a serious problem   that is an emergency. Do not wait to see if the symptoms will go away. Get medical help right away. Call your local emergency services (911 in the [place] Do not drive yourself to the hospital. °Summary °· After the procedure, it is common to have bruising and tenderness at the site. °· Follow instructions from your health care provider about how to take care of your radial site wound. Check the wound every day for signs of infection. °· Do not lift anything that is heavier than 10 lb (4.5 kg), or the limit that you are told, until your health care provider says  that it is safe. °This information is not intended to replace advice given to you by your health care provider. Make sure you discuss any questions you have with your health care provider. °Document Released: 08/28/2010 Document Revised: 08/31/2017 Document Reviewed: 08/31/2017 °Elsevier Patient Education © 2020 [employer] Inc[employer] ° °

## 2019-03-07 NOTE — Interval H&P Note (Signed)
History and Physical Interval Note:  03/07/2019 9:49 AM  Sara Leach  has presented today for surgery, with the diagnosis of chf.  The various methods of treatment have been discussed with the patient and family. After consideration of risks, benefits and other options for treatment, the patient has consented to  Procedure(s): RIGHT/LEFT HEART CATH AND CORONARY ANGIOGRAPHY (N/A) and possible coronary angioplasty as a surgical intervention.  The patient's history has been reviewed, patient examined, no change in status, stable for surgery.  I have reviewed the patient's chart and labs.  Questions were answered to the patient's satisfaction.     Daniel Bensimhon

## 2019-03-08 ENCOUNTER — Encounter (HOSPITAL_COMMUNITY): Payer: Self-pay | Admitting: Internal Medicine

## 2019-03-12 DIAGNOSIS — I10 Essential (primary) hypertension: Secondary | ICD-10-CM | POA: Diagnosis not present

## 2019-03-12 DIAGNOSIS — R600 Localized edema: Secondary | ICD-10-CM | POA: Diagnosis not present

## 2019-03-12 DIAGNOSIS — M9903 Segmental and somatic dysfunction of lumbar region: Secondary | ICD-10-CM | POA: Diagnosis not present

## 2019-03-12 DIAGNOSIS — M47816 Spondylosis without myelopathy or radiculopathy, lumbar region: Secondary | ICD-10-CM | POA: Diagnosis not present

## 2019-03-12 DIAGNOSIS — M5442 Lumbago with sciatica, left side: Secondary | ICD-10-CM | POA: Diagnosis not present

## 2019-03-12 DIAGNOSIS — I5021 Acute systolic (congestive) heart failure: Secondary | ICD-10-CM | POA: Diagnosis not present

## 2019-03-12 DIAGNOSIS — I4891 Unspecified atrial fibrillation: Secondary | ICD-10-CM | POA: Diagnosis not present

## 2019-03-13 ENCOUNTER — Encounter (HOSPITAL_COMMUNITY): Payer: Self-pay | Admitting: *Deleted

## 2019-03-13 NOTE — Progress Notes (Signed)
Received signed ROI from [facility] requesting OV note and cardiac reports.  Last OV note, EKG, echo and cath report all faxed to them at [phone]

## 2019-03-16 DIAGNOSIS — M1712 Unilateral primary osteoarthritis, left knee: Secondary | ICD-10-CM | POA: Diagnosis not present

## 2019-03-19 ENCOUNTER — Telehealth (HOSPITAL_COMMUNITY): Payer: Self-pay | Admitting: *Deleted

## 2019-03-19 DIAGNOSIS — H25811 Combined forms of age-related cataract, right eye: Secondary | ICD-10-CM | POA: Diagnosis not present

## 2019-03-19 DIAGNOSIS — H25012 Cortical age-related cataract, left eye: Secondary | ICD-10-CM | POA: Diagnosis not present

## 2019-03-19 DIAGNOSIS — H2512 Age-related nuclear cataract, left eye: Secondary | ICD-10-CM | POA: Diagnosis not present

## 2019-03-19 NOTE — Telephone Encounter (Signed)
Received fax from [facility], pt needs clearance tfor L knee total arthroplasty w/a spinal.  Per Dr Haroldine Laws: "Moderate risk, EF 35%, can stop Eliquis 2 days prior to surgerya nd resume 1 day after.  Would do surgery @ [facility] if possilbe"  Note faxed back to them at [phone], Attn: Caryl Pina

## 2019-03-20 ENCOUNTER — Encounter (HOSPITAL_COMMUNITY): Payer: Self-pay | Admitting: Internal Medicine

## 2019-03-20 ENCOUNTER — Ambulatory Visit (HOSPITAL_COMMUNITY)
Admission: RE | Admit: 2019-03-20 | Discharge: 2019-03-20 | Disposition: A | Discharge status: Home / Self Care | Payer: Medicare Other | Source: Ambulatory Visit | Attending: Internal Medicine | Admitting: Internal Medicine

## 2019-03-20 ENCOUNTER — Other Ambulatory Visit: Payer: Self-pay

## 2019-03-20 VITALS — BP 118/76 | HR 72 | Wt 210.4 lb

## 2019-03-20 DIAGNOSIS — Z79890 Hormone replacement therapy (postmenopausal): Secondary | ICD-10-CM | POA: Insufficient documentation

## 2019-03-20 DIAGNOSIS — E669 Obesity, unspecified: Secondary | ICD-10-CM | POA: Diagnosis not present

## 2019-03-20 DIAGNOSIS — Z8249 Family history of ischemic heart disease and other diseases of the circulatory system: Secondary | ICD-10-CM | POA: Diagnosis not present

## 2019-03-20 DIAGNOSIS — Z923 Personal history of irradiation: Secondary | ICD-10-CM | POA: Diagnosis not present

## 2019-03-20 DIAGNOSIS — I428 Other cardiomyopathies: Secondary | ICD-10-CM | POA: Insufficient documentation

## 2019-03-20 DIAGNOSIS — I5021 Acute systolic (congestive) heart failure: Secondary | ICD-10-CM | POA: Diagnosis not present

## 2019-03-20 DIAGNOSIS — K219 Gastro-esophageal reflux disease without esophagitis: Secondary | ICD-10-CM | POA: Diagnosis not present

## 2019-03-20 DIAGNOSIS — I5022 Chronic systolic (congestive) heart failure: Secondary | ICD-10-CM

## 2019-03-20 DIAGNOSIS — E785 Hyperlipidemia, unspecified: Secondary | ICD-10-CM | POA: Insufficient documentation

## 2019-03-20 DIAGNOSIS — R5383 Other fatigue: Secondary | ICD-10-CM | POA: Diagnosis not present

## 2019-03-20 DIAGNOSIS — Z7901 Long term (current) use of anticoagulants: Secondary | ICD-10-CM | POA: Insufficient documentation

## 2019-03-20 DIAGNOSIS — I4819 Other persistent atrial fibrillation: Secondary | ICD-10-CM | POA: Diagnosis not present

## 2019-03-20 DIAGNOSIS — I110 Hypertensive heart disease with heart failure: Secondary | ICD-10-CM | POA: Diagnosis not present

## 2019-03-20 DIAGNOSIS — R0683 Snoring: Secondary | ICD-10-CM

## 2019-03-20 DIAGNOSIS — Z6832 Body mass index (BMI) 32.0-32.9, adult: Secondary | ICD-10-CM | POA: Insufficient documentation

## 2019-03-20 DIAGNOSIS — Z79899 Other long term (current) drug therapy: Secondary | ICD-10-CM | POA: Insufficient documentation

## 2019-03-20 DIAGNOSIS — Z853 Personal history of malignant neoplasm of breast: Secondary | ICD-10-CM | POA: Diagnosis not present

## 2019-03-20 DIAGNOSIS — M1990 Unspecified osteoarthritis, unspecified site: Secondary | ICD-10-CM | POA: Insufficient documentation

## 2019-03-20 LAB — BRAIN NATRIURETIC PEPTIDE: B Natriuretic Peptide: 232.5 pg/mL — ABNORMAL HIGH (ref 0.0–100.0)

## 2019-03-20 LAB — BASIC METABOLIC PANEL
Anion gap: 9 (ref 5–15)
BUN: 21 mg/dL (ref 8–23)
CO2: 28 mmol/L (ref 22–32)
Calcium: 9.8 mg/dL (ref 8.9–10.3)
Chloride: 102 mmol/L (ref 98–111)
Creatinine, Ser: 1.05 mg/dL — ABNORMAL HIGH (ref 0.44–1.00)
GFR calc Af Amer: 59 mL/min — ABNORMAL LOW (ref >60)
GFR calc non Af Amer: 51 mL/min — ABNORMAL LOW (ref >60)
Glucose, Bld: 130 mg/dL — ABNORMAL HIGH (ref 70–99)
Potassium: 3.9 mmol/L (ref 3.5–5.1)
Sodium: 139 mmol/L (ref 135–145)

## 2019-03-20 MED ORDER — METOPROLOL SUCCINATE ER 25 MG PO TB24: 75.0000 mg | ORAL_TABLET | Freq: Every day | ORAL | 3 refills | Status: DC

## 2019-03-20 MED ORDER — SPIRONOLACTONE 25 MG PO TABS: 12.5000 mg | ORAL_TABLET | Freq: Every day | ORAL | 3 refills | Status: DC

## 2019-03-20 NOTE — Progress Notes (Signed)
ADVANCED HF CLINIC  NOTE  Referring Physician: Br[facility]ine Primary Care: Dr. Dalia Heading Primary Cardiologist: New  HPI:  Sara Leach is a 78 y/o woman with h/o obesity, breast CA (2009) - s/p XRT and partial mastectomy (no chemo), HTN , HL who is referred by Dr. Robin Searing for further evaluation of recent onset AF and systolic HF.   First found to be in AF during regular physical exam in 4/20. In June got echo at [facility] showed EF 40% with anterior and AS HK, mild MR/TR. + marked biatrial enlargement. Started on lisinopril and Toprol and Eliquis. Referred for further eval.    We saw her for the first time in 7/20 and repeat echo .with EF with anterior and anteroseptal WMA. Underwent cath as below with normal coronary arteries. EF 30-35%. Normal RHC>   Returns today for routine follow-up. Feeling much better though she remains in AF. No SOB, orthopnea or PND. Mild edema but improved with lasix  Still fatigued. + snoring.   Cath 03/07/19  Ao = 135/79 (103) LV =  140/7 RA =  3  RV =  26/6 PA =  27/9 (19) PCW = 9 Fick cardiac output/index = 5.0/2.4 PVR = 2.0 WU SVR = 1587 Ao sat = 100% PA sat = 68%, 71%  Assessment: 1. Normal coronaries 2. NICM EF 30-35% 3. Well-compensated hemodynamics. No evidence of PAH   Past Medical History:  Diagnosis Date  . Atrial fibrillation ([place]) 01/2019  . Breast cancer ([place])   . Cataracts, bilateral   . GERD (gastroesophageal reflux disease)   . Hyperlipidemia   . Hypertension   . Hyperthyroidism   . Obesity   . Osteoarthritis   . Systolic heart failure ([place]) 01/2019   EF 40% by echo done 01/17/2019    Current Outpatient Medications  Medication Sig Dispense Refill  . acetaminophen (TYLENOL) 500 MG tablet Take 500 mg by mouth 2 (two) times a day.    Marland Kitchen apixaban (ELIQUIS) 5 MG TABS tablet Take 5 mg by mouth 2 (two) times daily.    Marland Kitchen Besifloxacin HCl (BESIVANCE OP) Apply to eye.    . Bromfenac Sodium (PROLENSA OP) Apply to eye.    .  calcium carbonate (OS-CAL) 600 MG tablet Take 600 mg by mouth daily.    . Difluprednate (DUREZOL OP) Apply to eye.    . Famotidine (PEPCID AC MAXIMUM STRENGTH) 20 MG CHEW Chew 1 tablet by mouth at bedtime.    . furosemide (LASIX) 40 MG tablet Take 1 tablet (40 mg total) by mouth daily. 30 tablet 6  . levothyroxine (SYNTHROID) 75 MCG tablet Take 75 mcg by mouth daily before breakfast.    . Liniments (PAIN RELIEF EX) Apply 1 application topically 3 (three) times daily as needed (back pain.). [place] Gel - White Active Ingredients  Camphor 3.00% Menthol 5.00%    . metoprolol succinate (TOPROL-XL) 50 MG 24 hr tablet Take 1 tablet (50 mg total) by mouth daily. 30 tablet 6  . Multiple Vitamins-Minerals (EMERGEN-C VITAMIN C PO) Take by mouth.    . potassium chloride SA (K-DUR) 20 MEQ tablet Take 1 tablet (20 mEq total) by mouth daily. 30 tablet 6  . sacubitril-valsartan (ENTRESTO) 24-26 MG Take 1 tablet by mouth 2 (two) times daily. 60 tablet 3   No current facility-administered medications for this encounter.     No Known Allergies    Social History   Socioeconomic History  . Marital status: Married    Spouse name: Not on  file  . Number of children: Not on file  . Years of education: Not on file  . Highest education level: Not on file  Occupational History  . Not on file  Social Needs  . Financial resource strain: Not on file  . Food insecurity    Worry: Not on file    Inability: Not on file  . Transportation needs    Medical: Not on file    Non-medical: Not on file  Tobacco Use  . Smoking status: Never Smoker  . Smokeless tobacco: Never Used  Substance and Sexual Activity  . Alcohol use: Yes    Comment: occasionally has wine  . Drug use: Never  . Sexual activity: Not on file  Lifestyle  . Physical activity    Days per week: Not on file    Minutes per session: Not on file  . Stress: Not on file  Relationships  . Social Herbalist on phone: Not on file    Gets  together: Not on file    Attends religious service: Not on file    Active member of club or organization: Not on file    Attends meetings of clubs or organizations: Not on file    Relationship status: Not on file  . Intimate partner violence    Fear of current or ex partner: Not on file    Emotionally abused: Not on file    Physically abused: Not on file    Forced sexual activity: Not on file  Other Topics Concern  . Not on file  Social History Narrative  . Not on file      Family History  Problem Relation Age of Onset  . Ovarian cancer Mother   . Coronary artery disease Father   . AAA (abdominal aortic aneurysm) Father 29       cause of death  . Alzheimer's disease Sister   . Alzheimer's disease Brother   . Heart defect Son        VSD, casued death at 51 m old  . Alzheimer's disease Brother   . Alzheimer's disease Brother     Vitals:   03/20/19 1449  BP: 118/76  Pulse: 72  SpO2: 96%  Weight: 95.4 kg (210 lb 6.4 oz)    PHYSICAL EXAM: General:  Well appearing. No resp difficulty HEENT: normal Neck: supple. no JVD. Carotids 2+ bilat; no bruits. No lymphadenopathy or thryomegaly appreciated. Cor: PMI nondisplaced. irregular rate & rhythm. No rubs, gallops or murmurs. Lungs: clear Abdomen: obese soft, nontender, nondistended. No hepatosplenomegaly. No bruits or masses. Good bowel sounds. Extremities: no cyanosis, clubbing, rash, edema Neuro: alert & orientedx3, cranial nerves grossly intact. moves all 4 extremities w/o difficulty. Affect pleasant  ECG: AF 91. LVH. iLBBB Personally reviewed  ASSESSMENT & PLAN:  1. Acute systolic HF due to NICM - Echo 6/20 [facility] EF 40% with anterior and AS HK, mild MR/TR. + marked biatrial enlargement.  - Echo 7/20 EF 30% with anterior and AS HK, mild MR/TR + marked biatrial enlargement. - Cath 7/20 No CAD. EF 30-35% - NYHA II - Volume status looks good on exam and recent cath  - Continue Entresto 49/51 bid (36-hour  washout) - Increase Toprol to 75mg  daily - Continue lasix 40 daily - Add spiro 12.5 - Labs today - Etiology of NICM remains unclear (? AF but rate controlled, ? OSA, ? Viral) - Consider cMRI   2. Persistent AF - likely onset within past few months. -  by echo report seems like she may have restrictive CM with severe biatrial enlargement. Suspect this may be cause of AF - Rate improved on Toprol but still on the fast side. Increase Toprol to 75 daily - Continue Apixaban - Will not pursue DC-CV at this point as unlikely to hold NSR with LAE.  - Sleep study improved  3. HTN - BP improved with switch to Entresto - Continue Entresto 49/51 - other meds as above.   4. Fatigue/snoring - possible OSA - check sleep study  Glori Bickers, MD  3:15 PM

## 2019-03-20 NOTE — Patient Instructions (Addendum)
START Sara Leach 12.5mg  (1/2 tab) daily  INCREASE Toprol XL to 75mg  (3 tabs) daily  Labs today and repeat in 1 week ( you were given a prescription to have this done at yor PCP office. We will only contact you if something comes back abnormal or we need to make some changes. Otherwise no news is good news!   Your provider has recommended that you have a home sleep study.  Sara Leach is the company that provides these and will send the equipment right to your home with instructions on how to set it up.  Once you have completed the test you just dispose of the equipment, the information is automatically uploaded to [place].  IF you have any questions or issues with the equipment please call the company.  If your test was positive and you need a home CPAP machine you will be contacted by Sara Leach office [facility]) to set this up.  Your physician recommends that you schedule a follow-up  appointment in: 4 weeks with Sara Leach   At the [facility], you and your health needs are our priority. As part of our continuing mission to provide you with exceptional heart care, we have created designated Provider Care Teams. These Care Teams include your primary Cardiologist (physician) and Advanced Practice Providers (APPs- Physician Assistants and Nurse Practitioners) who all work together to provide you with the care you need, when you need it.    You may see any of the following providers on your designated Care Team at your next follow up: Marland Kitchen Sara Leach . Sara Leach . Sara Grinder, NP   Please be sure to bring in all your medications bottles to every appointment.

## 2019-03-20 NOTE — Progress Notes (Addendum)
Patient Name:  Sara Leach         DOB: [birth year]      Height: 5'7    Weight: 210 lb  Office Name: Advanced Heart Failure         Referring Provider: Dr Aundra Dubin  Today's Date: 03/20/19  Date:   STOP BANG RISK ASSESSMENT S (snore) Have you been told that you snore?     YES   T (tired) Are you often tired, fatigued, or sleepy during the day?   YES  O (obstruction) Do you stop breathing, choke, or gasp during sleep? NO   P (pressure) Do you have or are you being treated for high blood pressure? YES   B (BMI) Is your body index greater than 35 kg/m? YES   A (age) Are you 78 years old or older? YES   N (neck) Do you have a neck circumference greater than 16 inches?   na   G (gender) Are you a female? NO   TOTAL STOP/BANG "YES" ANSWERS 5                                                                       For Office Use Only              Procedure Order Form    YES to 3+ Stop Bang questions OR two clinical symptoms - patient qualifies for WatchPAT (CPT 95800)     Submit: This Form + Patient Face Sheet + Clinical Note via CloudPAT or Fax: [phone]         Clinical Notes: Will consult Sleep Specialist and refer for management of therapy due to patient increased risk of Sleep Apnea. Ordering a sleep study due to the following two clinical symptoms: Excessive daytime sleepiness G47.10 / Gastroesophageal reflux K21.9 / Nocturia R35.1 / Morning Headaches G44.221 / Difficulty concentrating R41.840 / Memory problems or poor judgment G31.84 / Personality changes or irritability R45.4 / Loud snoring R06.83 / Depression F32.9 / Unrefreshed by sleep G47.8 / Impotence N52.9 / History of high blood pressure R03.0 / Insomnia G47.00    I understand that I am proceeding with a home sleep apnea test as ordered by my treating physician. I understand that untreated sleep apnea is a serious cardiovascular risk factor and it is my responsibility to perform the test and seek management for sleep apnea. I  will be contacted with the results and be managed for sleep apnea by a local sleep physician. I will be receiving equipment and further instructions from [facility]. I shall promptly ship back the equipment via the included mailing label. I understand my insurance will be billed for the test and as the patient I am responsible for any insurance related out-of-pocket costs incurred. I have been provided with written instructions and can call for additional video or telephonic instruction, with 24-hour availability of qualified personnel to answer any questions: Patient Help Desk [phone].  Patient Signature ______________________________________________________   Date______________________ Patient Telemedicine Verbal Consent

## 2019-03-26 DIAGNOSIS — M5442 Lumbago with sciatica, left side: Secondary | ICD-10-CM | POA: Diagnosis not present

## 2019-03-26 DIAGNOSIS — M9903 Segmental and somatic dysfunction of lumbar region: Secondary | ICD-10-CM | POA: Diagnosis not present

## 2019-03-26 DIAGNOSIS — M47816 Spondylosis without myelopathy or radiculopathy, lumbar region: Secondary | ICD-10-CM | POA: Diagnosis not present

## 2019-03-26 DIAGNOSIS — I4891 Unspecified atrial fibrillation: Secondary | ICD-10-CM | POA: Diagnosis not present

## 2019-03-26 DIAGNOSIS — K219 Gastro-esophageal reflux disease without esophagitis: Secondary | ICD-10-CM | POA: Diagnosis not present

## 2019-03-26 DIAGNOSIS — E039 Hypothyroidism, unspecified: Secondary | ICD-10-CM | POA: Diagnosis not present

## 2019-03-26 DIAGNOSIS — I10 Essential (primary) hypertension: Secondary | ICD-10-CM | POA: Diagnosis not present

## 2019-03-27 DIAGNOSIS — H2512 Age-related nuclear cataract, left eye: Secondary | ICD-10-CM | POA: Diagnosis not present

## 2019-03-30 DIAGNOSIS — M9903 Segmental and somatic dysfunction of lumbar region: Secondary | ICD-10-CM | POA: Diagnosis not present

## 2019-03-30 DIAGNOSIS — M47816 Spondylosis without myelopathy or radiculopathy, lumbar region: Secondary | ICD-10-CM | POA: Diagnosis not present

## 2019-03-30 DIAGNOSIS — M5442 Lumbago with sciatica, left side: Secondary | ICD-10-CM | POA: Diagnosis not present

## 2019-04-02 ENCOUNTER — Telehealth (HOSPITAL_COMMUNITY): Payer: Self-pay

## 2019-04-02 DIAGNOSIS — H25812 Combined forms of age-related cataract, left eye: Secondary | ICD-10-CM | POA: Diagnosis not present

## 2019-04-02 DIAGNOSIS — H2512 Age-related nuclear cataract, left eye: Secondary | ICD-10-CM | POA: Diagnosis not present

## 2019-04-02 NOTE — Telephone Encounter (Signed)
Order, OV note, stop bang and demographics all faxed to [facility] via epic  

## 2019-04-03 ENCOUNTER — Other Ambulatory Visit (HOSPITAL_COMMUNITY): Payer: Self-pay | Admitting: Internal Medicine

## 2019-04-04 DIAGNOSIS — M9903 Segmental and somatic dysfunction of lumbar region: Secondary | ICD-10-CM | POA: Diagnosis not present

## 2019-04-04 DIAGNOSIS — M5442 Lumbago with sciatica, left side: Secondary | ICD-10-CM | POA: Diagnosis not present

## 2019-04-04 DIAGNOSIS — M47816 Spondylosis without myelopathy or radiculopathy, lumbar region: Secondary | ICD-10-CM | POA: Diagnosis not present

## 2019-04-11 DIAGNOSIS — M5442 Lumbago with sciatica, left side: Secondary | ICD-10-CM | POA: Diagnosis not present

## 2019-04-11 DIAGNOSIS — M9903 Segmental and somatic dysfunction of lumbar region: Secondary | ICD-10-CM | POA: Diagnosis not present

## 2019-04-11 DIAGNOSIS — M47816 Spondylosis without myelopathy or radiculopathy, lumbar region: Secondary | ICD-10-CM | POA: Diagnosis not present

## 2019-04-18 DIAGNOSIS — M1712 Unilateral primary osteoarthritis, left knee: Secondary | ICD-10-CM | POA: Diagnosis not present

## 2019-04-23 ENCOUNTER — Ambulatory Visit (HOSPITAL_COMMUNITY)
Admission: RE | Admit: 2019-04-23 | Discharge: 2019-04-23 | Disposition: A | Discharge status: Home / Self Care | Payer: Medicare Other | Source: Ambulatory Visit | Attending: Internal Medicine | Admitting: Internal Medicine

## 2019-04-23 ENCOUNTER — Other Ambulatory Visit: Payer: Self-pay

## 2019-04-23 VITALS — BP 110/68 | HR 77 | Wt 204.5 lb

## 2019-04-23 DIAGNOSIS — I4891 Unspecified atrial fibrillation: Secondary | ICD-10-CM | POA: Diagnosis not present

## 2019-04-23 DIAGNOSIS — M1990 Unspecified osteoarthritis, unspecified site: Secondary | ICD-10-CM | POA: Insufficient documentation

## 2019-04-23 DIAGNOSIS — I4819 Other persistent atrial fibrillation: Secondary | ICD-10-CM | POA: Insufficient documentation

## 2019-04-23 DIAGNOSIS — Z79899 Other long term (current) drug therapy: Secondary | ICD-10-CM | POA: Insufficient documentation

## 2019-04-23 DIAGNOSIS — R5383 Other fatigue: Secondary | ICD-10-CM | POA: Insufficient documentation

## 2019-04-23 DIAGNOSIS — E785 Hyperlipidemia, unspecified: Secondary | ICD-10-CM | POA: Insufficient documentation

## 2019-04-23 DIAGNOSIS — R0683 Snoring: Secondary | ICD-10-CM | POA: Diagnosis not present

## 2019-04-23 DIAGNOSIS — I5022 Chronic systolic (congestive) heart failure: Secondary | ICD-10-CM | POA: Diagnosis not present

## 2019-04-23 DIAGNOSIS — I428 Other cardiomyopathies: Secondary | ICD-10-CM | POA: Diagnosis not present

## 2019-04-23 DIAGNOSIS — K219 Gastro-esophageal reflux disease without esophagitis: Secondary | ICD-10-CM | POA: Insufficient documentation

## 2019-04-23 DIAGNOSIS — I5021 Acute systolic (congestive) heart failure: Secondary | ICD-10-CM | POA: Insufficient documentation

## 2019-04-23 DIAGNOSIS — Z8249 Family history of ischemic heart disease and other diseases of the circulatory system: Secondary | ICD-10-CM | POA: Diagnosis not present

## 2019-04-23 DIAGNOSIS — Z7901 Long term (current) use of anticoagulants: Secondary | ICD-10-CM | POA: Insufficient documentation

## 2019-04-23 DIAGNOSIS — Z79890 Hormone replacement therapy (postmenopausal): Secondary | ICD-10-CM | POA: Diagnosis not present

## 2019-04-23 DIAGNOSIS — Z923 Personal history of irradiation: Secondary | ICD-10-CM | POA: Insufficient documentation

## 2019-04-23 DIAGNOSIS — Z853 Personal history of malignant neoplasm of breast: Secondary | ICD-10-CM | POA: Insufficient documentation

## 2019-04-23 DIAGNOSIS — Z6832 Body mass index (BMI) 32.0-32.9, adult: Secondary | ICD-10-CM | POA: Diagnosis not present

## 2019-04-23 DIAGNOSIS — I110 Hypertensive heart disease with heart failure: Secondary | ICD-10-CM | POA: Insufficient documentation

## 2019-04-23 DIAGNOSIS — E669 Obesity, unspecified: Secondary | ICD-10-CM | POA: Diagnosis not present

## 2019-04-23 LAB — BASIC METABOLIC PANEL
Anion gap: 11 (ref 5–15)
BUN: 31 mg/dL — ABNORMAL HIGH (ref 8–23)
CO2: 24 mmol/L (ref 22–32)
Calcium: 9.9 mg/dL (ref 8.9–10.3)
Chloride: 101 mmol/L (ref 98–111)
Creatinine, Ser: 1.3 mg/dL — ABNORMAL HIGH (ref 0.44–1.00)
GFR calc Af Amer: 46 mL/min — ABNORMAL LOW (ref >60)
GFR calc non Af Amer: 39 mL/min — ABNORMAL LOW (ref >60)
Glucose, Bld: 114 mg/dL — ABNORMAL HIGH (ref 70–99)
Potassium: 4.6 mmol/L (ref 3.5–5.1)
Sodium: 136 mmol/L (ref 135–145)

## 2019-04-23 LAB — CBC
HCT: 43.5 % (ref 36.0–46.0)
Hemoglobin: 14.1 g/dL (ref 12.0–15.0)
MCH: 30.5 pg (ref 26.0–34.0)
MCHC: 32.4 g/dL (ref 30.0–36.0)
MCV: 94 fL (ref 80.0–100.0)
Platelets: 283 10*3/uL (ref 150–400)
RBC: 4.63 MIL/uL (ref 3.87–5.11)
RDW: 15.3 % (ref 11.5–15.5)
WBC: 4.9 10*3/uL (ref 4.0–10.5)
nRBC: 0 % (ref 0.0–0.2)

## 2019-04-23 LAB — BRAIN NATRIURETIC PEPTIDE: B Natriuretic Peptide: 217.9 pg/mL — ABNORMAL HIGH (ref 0.0–100.0)

## 2019-04-23 MED ORDER — METOPROLOL SUCCINATE ER 50 MG PO TB24: 50.0000 mg | ORAL_TABLET | Freq: Every day | ORAL | 3 refills | Status: DC

## 2019-04-23 MED ORDER — FUROSEMIDE 40 MG PO TABS: 40.0000 mg | ORAL_TABLET | ORAL | 3 refills | Status: DC

## 2019-04-23 NOTE — Progress Notes (Signed)
ADVANCED HF CLINIC  NOTE  Referring Physician: [facility] Care: Dr. Dalia Heading Primary Cardiologist: New  HPI:  Ms. Drum is a 78 y/o woman with h/o obesity, breast CA (2009) - s/p XRT and partial mastectomy (no chemo), HTN , HL who is referred by Dr. Robin Searing for further evaluation of recent onset AF and systolic HF.   First found to be in AF during regular physical exam in 4/20. In June got echo at [facility] showed EF 40% with anterior and AS HK, mild MR/TR. + marked biatrial enlargement. Started on lisinopril and Toprol and Eliquis. Referred for further eval.    We saw her for the first time in 7/20 and repeat echo .with EF with anterior and anteroseptal WMA. Underwent cath as below with normal coronary arteries. EF 30-35%. Normal RHC  Sleep study still pending .   Returns today for routine follow-up. At last visit Toprol increased from 50-> 75 mg daily to help imprvoe AF rate control and spiro added. Since that time has been much more fatigued. Denies SOB, orthopnea or PND. Still has not had sleep study. No edema, orthopnea or PND. Snores loudly. Gets dizzy if she stands up too quickly.    Cath 03/07/19  Ao = 135/79 (103) LV =  140/7 RA =  3  RV =  26/6 PA =  27/9 (19) PCW = 9 Fick cardiac output/index = 5.0/2.4 PVR = 2.0 WU SVR = 1587 Ao sat = 100% PA sat = 68%, 71%  Assessment: 1. Normal coronaries 2. NICM EF 30-35% 3. Well-compensated hemodynamics. No evidence of PAH   Past Medical History:  Diagnosis Date  . Atrial fibrillation ([place]) 01/2019  . Breast cancer ([place])   . Cataracts, bilateral   . GERD (gastroesophageal reflux disease)   . Hyperlipidemia   . Hypertension   . Hyperthyroidism   . Obesity   . Osteoarthritis   . Systolic heart failure ([place]) 01/2019   EF 40% by echo done 01/17/2019    Current Outpatient Medications  Medication Sig Dispense Refill  . acetaminophen (TYLENOL) 500 MG tablet Take 500 mg by mouth 2 (two) times a day.     Marland Kitchen apixaban (ELIQUIS) 5 MG TABS tablet Take 5 mg by mouth 2 (two) times daily.    . calcium carbonate (OS-CAL) 600 MG tablet Take 600 mg by mouth daily.    . Famotidine (PEPCID AC MAXIMUM STRENGTH) 20 MG CHEW Chew 1 tablet by mouth at bedtime.    . furosemide (LASIX) 40 MG tablet Take 1 tablet (40 mg total) by mouth daily. 30 tablet 6  . levothyroxine (SYNTHROID) 75 MCG tablet Take 75 mcg by mouth daily before breakfast.    . Liniments (PAIN RELIEF EX) Apply 1 application topically 3 (three) times daily as needed (back pain.). [place] Gel - White Active Ingredients  Camphor 3.00% Menthol 5.00%    . metoprolol succinate (TOPROL-XL) 25 MG 24 hr tablet Take 3 tablets (75 mg total) by mouth daily. 270 tablet 3  . Multiple Vitamins-Minerals (EMERGEN-C VITAMIN C PO) Take by mouth.    . potassium chloride SA (K-DUR) 20 MEQ tablet Take 1 tablet (20 mEq total) by mouth daily. 30 tablet 6  . sacubitril-valsartan (ENTRESTO) 24-26 MG Take 1 tablet by mouth 2 (two) times daily. 60 tablet 3  . spironolactone (ALDACTONE) 25 MG tablet Take 0.5 tablets (12.5 mg total) by mouth daily. 45 tablet 3   No current facility-administered medications for this encounter.     No  Known Allergies    Social History   Socioeconomic History  . Marital status: Married    Spouse name: Not on file  . Number of children: Not on file  . Years of education: Not on file  . Highest education level: Not on file  Occupational History  . Not on file  Social Needs  . Financial resource strain: Not on file  . Food insecurity    Worry: Not on file    Inability: Not on file  . Transportation needs    Medical: Not on file    Non-medical: Not on file  Tobacco Use  . Smoking status: Never Smoker  . Smokeless tobacco: Never Used  Substance and Sexual Activity  . Alcohol use: Yes    Comment: occasionally has wine  . Drug use: Never  . Sexual activity: Not on file  Lifestyle  . Physical activity    Days per week: Not on  file    Minutes per session: Not on file  . Stress: Not on file  Relationships  . Social Herbalist on phone: Not on file    Gets together: Not on file    Attends religious service: Not on file    Active member of club or organization: Not on file    Attends meetings of clubs or organizations: Not on file    Relationship status: Not on file  . Intimate partner violence    Fear of current or ex partner: Not on file    Emotionally abused: Not on file    Physically abused: Not on file    Forced sexual activity: Not on file  Other Topics Concern  . Not on file  Social History Narrative  . Not on file      Family History  Problem Relation Age of Onset  . Ovarian cancer Mother   . Coronary artery disease Father   . AAA (abdominal aortic aneurysm) Father 42       cause of death  . Alzheimer's disease Sister   . Alzheimer's disease Brother   . Heart defect Son        VSD, casued death at 51 m old  . Alzheimer's disease Brother   . Alzheimer's disease Brother     Vitals:   04/23/19 0957  BP: 110/68  Pulse: 77  SpO2: 98%  Weight: 92.8 kg (204 lb 8 oz)    PHYSICAL EXAM: General:  Fatigued appearing. No resp difficulty HEENT: normal Neck: supple. no JVD. Carotids 2+ bilat; no bruits. No lymphadenopathy or thryomegaly appreciated. Cor: PMI nondisplaced. Irregular rate & rhythm. No rubs, gallops or murmurs. Lungs: clear Abdomen: obese soft, nontender, nondistended. No hepatosplenomegaly. No bruits or masses. Good bowel sounds. Extremities: no cyanosis, clubbing, rash, edema Neuro: alert & orientedx3, cranial nerves grossly intact. moves all 4 extremities w/o difficulty. Affect pleasant   ECG: AF 98. LVH. iLBBB Personally reviewed  ASSESSMENT & PLAN:  1. Acute systolic HF due to NICM - Echo 6/20 [facility] EF 40% with anterior and AS HK, mild MR/TR. + marked biatrial enlargement.  - Echo 7/20 EF 30% with anterior and AS HK, mild MR/TR + marked biatrial  enlargement. - Cath 7/20 No CAD. EF 30-35% - NYHA II-III (worse with increased b-blocker) - Volume status looks good on exam and recent cath. Suspect she is probably a bit dry now - Continue Entresto 49/51 bid - Decrease Toprol back to 50 mg daily - Decrease lasix to 40 mg M/W/F. May  be able to stop soon - Continue spiro 12.5 - Etiology of NICM remains unclear (? AF but rate controlled, ? OSA, ? Viral) - Repeat echo in 6 weeks if EF no better consider cMRI +/- EP referral for ICD - labs today  2. Persistent AF - by echo report seems like she may have restrictive CM with severe biatrial enlargement. Suspect this may be cause of AF - Rate improved on Toprol but still on the fast side. Unable to tolerate Toprol at 75. Will decease back to 83 - Continue Apixaban - Will not pursue DC-CV at this point as unlikely to hold NSR with LAE.  - Place Zio monitor to assess rate control - If poor rate control will have to discuss attempt at DC-CV vs AF ablation or AVN ablation and CRT-D - Await Zio and repeat echo data  3. HTN - Blood pressure well controlled. Continue current regimen.  4. Fatigue/snoring - possible OSA - check sleep study - will reorder today  Glori Bickers, MD  9:40 AM

## 2019-04-23 NOTE — Patient Instructions (Signed)
Lab work done today. We will notify you of any abnormal lab work. No news is good news!  DECREASE Toprol to 50mg . 1 tab every evening at bedtime.  DECREASE Furosemide to 40mg  every Monday, Wednesday and Friday.  Your provider has recommended that  you wear a Zio Patch for 7 days.  This monitor will record your heart rhythm for our review.  IF you have any symptoms while wearing the monitor please press the button.  If you have any issues with the patch or you notice a red or orange light on it please call the company at [phone].  Once you remove the patch please mail it back to the company as soon as possible so we can get the results.    Your physician has requested that you have an echocardiogram. Echocardiography is a painless test that uses sound waves to create images of your heart. It provides your doctor with information about the size and shape of your heart and how well your heart's chambers and valves are working. This procedure takes approximately one hour. There are no restrictions for this procedure. This will be done at your follow up appointment.  Please follow up with the [facility] in 6-8 weeks.  At the [facility], you and your health needs are our priority. As part of our continuing mission to provide you with exceptional heart care, we have created designated Provider Care Teams. These Care Teams include your primary Cardiologist (physician) and Advanced Practice Providers (APPs- Physician Assistants and Nurse Practitioners) who all work together to provide you with the care you need, when you need it.   You may see any of the following providers on your designated Care Team at your next follow up: Marland Kitchen Dr Glori Bickers . Dr Loralie Champagne . Darrick Grinder, NP   Please be sure to bring in all your medications bottles to every appointment.

## 2019-04-25 DIAGNOSIS — M5442 Lumbago with sciatica, left side: Secondary | ICD-10-CM | POA: Diagnosis not present

## 2019-04-25 DIAGNOSIS — M9903 Segmental and somatic dysfunction of lumbar region: Secondary | ICD-10-CM | POA: Diagnosis not present

## 2019-04-25 DIAGNOSIS — M47816 Spondylosis without myelopathy or radiculopathy, lumbar region: Secondary | ICD-10-CM | POA: Diagnosis not present

## 2019-05-07 ENCOUNTER — Other Ambulatory Visit (HOSPITAL_COMMUNITY): Payer: Self-pay | Admitting: *Deleted

## 2019-05-07 ENCOUNTER — Other Ambulatory Visit (HOSPITAL_COMMUNITY): Payer: Self-pay | Admitting: Internal Medicine

## 2019-05-08 DIAGNOSIS — I4891 Unspecified atrial fibrillation: Secondary | ICD-10-CM | POA: Diagnosis not present

## 2019-05-10 DIAGNOSIS — M47816 Spondylosis without myelopathy or radiculopathy, lumbar region: Secondary | ICD-10-CM | POA: Diagnosis not present

## 2019-05-10 DIAGNOSIS — M9903 Segmental and somatic dysfunction of lumbar region: Secondary | ICD-10-CM | POA: Diagnosis not present

## 2019-05-10 DIAGNOSIS — M5442 Lumbago with sciatica, left side: Secondary | ICD-10-CM | POA: Diagnosis not present

## 2019-05-15 NOTE — Addendum Note (Signed)
Encounter addended by: Micki Riley, RN on: 05/15/2019 12:11 PM  Actions taken: Imaging Exam ended

## 2019-05-15 NOTE — Addendum Note (Signed)
Encounter addended by: Micki Riley, RN on: 05/15/2019 11:37 AM  Actions taken: Imaging Exam ended

## 2019-05-23 DIAGNOSIS — M5442 Lumbago with sciatica, left side: Secondary | ICD-10-CM | POA: Diagnosis not present

## 2019-05-23 DIAGNOSIS — M47816 Spondylosis without myelopathy or radiculopathy, lumbar region: Secondary | ICD-10-CM | POA: Diagnosis not present

## 2019-05-23 DIAGNOSIS — M9903 Segmental and somatic dysfunction of lumbar region: Secondary | ICD-10-CM | POA: Diagnosis not present

## 2019-05-30 ENCOUNTER — Telehealth (HOSPITAL_COMMUNITY): Payer: Self-pay | Admitting: *Deleted

## 2019-05-30 NOTE — Telephone Encounter (Signed)
pts granddaughter called stating patient had not received home sleep study that was ordered in July then again in august. I checked the [employer] and the company tried to contact the patient three times but was unable to reach patient. I explained to the grand daughter that they call from an 800 number so it eas likely missed by the patient. I gave the grand daughter the company phone number and told her to contact them for shipment.

## 2019-06-04 DIAGNOSIS — M1712 Unilateral primary osteoarthritis, left knee: Secondary | ICD-10-CM | POA: Diagnosis present

## 2019-06-04 NOTE — H&P (Signed)
TOTAL KNEE ADMISSION H&P  Patient is being admitted for left total knee arthroplasty.  Subjective:  Chief Complaint:left knee pain.  HPI: Sara Leach, 78 y.o. female, has a history of pain and functional disability in the left knee due to arthritis and has failed non-surgical conservative treatments for greater than 12 weeks to includeNSAID's and/or analgesics, corticosteriod injections, viscosupplementation injections, use of assistive devices and weight reduction as appropriate.  Onset of symptoms was gradual, starting 4 years ago with gradually worsening course since that time. The patient noted no past surgery on the left knee(s).  Patient currently rates pain in the left knee(s) at 5 out of 10 with activity. Patient has night pain, worsening of pain with activity and weight bearing, pain that interferes with activities of daily living, pain with passive range of motion, crepitus and joint swelling.  Patient has evidence of subchondral sclerosis, periarticular osteophytes, joint subluxation and joint space narrowing by imaging studies. There is no active infection.  Patient Active Problem List   Diagnosis Date Noted  . Primary osteoarthritis of left knee 06/04/2019  . A-fib ([place]) 02/28/2019  . Chronic systolic heart failure ([place]) 02/28/2019   Past Medical History:  Diagnosis Date  . Atrial fibrillation ([place]) 01/2019  . Breast cancer ([place])   . Cataracts, bilateral   . GERD (gastroesophageal reflux disease)   . Hyperlipidemia   . Hypertension   . Hyperthyroidism   . Obesity   . Osteoarthritis   . Systolic heart failure ([place]) 01/2019   EF 40% by echo done 01/17/2019    Past Surgical History:  Procedure Laterality Date  . BREAST LUMPECTOMY  07/09/2008  . MASTECTOMY, PARTIAL  07/09/2008  . REPLACEMENT TOTAL KNEE Right 2007  . RIGHT/LEFT HEART CATH AND CORONARY ANGIOGRAPHY N/A 03/07/2019   Procedure: RIGHT/LEFT HEART CATH AND CORONARY ANGIOGRAPHY;  Surgeon: Jolaine Artist, MD;  Location: [facility];  Service: Cardiovascular;  Laterality: N/A;    No current facility-administered medications for this encounter.    Current Outpatient Medications  Medication Sig Dispense Refill Last Dose  . acetaminophen (TYLENOL) 500 MG tablet Take 500 mg by mouth 2 (two) times a day.     Marland Kitchen apixaban (ELIQUIS) 5 MG TABS tablet Take 5 mg by mouth 2 (two) times daily.     . calcium carbonate (OS-CAL) 600 MG tablet Take 600 mg by mouth daily.     . Famotidine (PEPCID AC MAXIMUM STRENGTH) 20 MG CHEW Chew 1 tablet by mouth at bedtime.     . furosemide (LASIX) 40 MG tablet Take 1 tablet (40 mg total) by mouth every Monday, Wednesday, and Friday. 30 tablet 3   . levothyroxine (SYNTHROID) 75 MCG tablet Take 75 mcg by mouth daily before breakfast.     . Liniments (PAIN RELIEF EX) Apply 1 application topically 3 (three) times daily as needed (back pain.). [place] Gel - White Active Ingredients  Camphor 3.00% Menthol 5.00%     . metoprolol succinate (TOPROL-XL) 50 MG 24 hr tablet Take 1 tablet (50 mg total) by mouth at bedtime. 90 tablet 3   . Multiple Vitamins-Minerals (EMERGEN-C VITAMIN C PO) Take by mouth.     . potassium chloride SA (K-DUR) 20 MEQ tablet Take 1 tablet (20 mEq total) by mouth daily. 30 tablet 6   . sacubitril-valsartan (ENTRESTO) 24-26 MG Take 1 tablet by mouth 2 (two) times daily. 60 tablet 3   . spironolactone (ALDACTONE) 25 MG tablet Take 0.5 tablets (12.5 mg total) by  mouth daily. 45 tablet 3    No Known Allergies  Social History   Tobacco Use  . Smoking status: Never Smoker  . Smokeless tobacco: Never Used  Substance Use Topics  . Alcohol use: Yes    Comment: occasionally has wine    Family History  Problem Relation Age of Onset  . Ovarian cancer Mother   . Coronary artery disease Father   . AAA (abdominal aortic aneurysm) Father 58       cause of death  . Alzheimer's disease Sister   . Alzheimer's disease Brother   . Heart defect Son         VSD, casued death at 60 m old  . Alzheimer's disease Brother   . Alzheimer's disease Brother      Review of Systems  Constitutional: Negative.   HENT: Negative.   Eyes: Negative.   Respiratory: Negative.   Cardiovascular: Positive for leg swelling. Negative for chest pain and palpitations.  Gastrointestinal: Negative.   Genitourinary: Negative.   Musculoskeletal: Positive for back pain and joint pain.  Skin: Negative.   Neurological: Negative.   Endo/Heme/Allergies: Negative.   Psychiatric/Behavioral: Negative.     Objective:  Physical Exam  Constitutional: She is oriented to person, place, and time. She appears well-developed and well-nourished. No distress.  HENT:  Head: Normocephalic and atraumatic.  Eyes: Pupils are equal, round, and reactive to light. EOM are normal.  Neck: Normal range of motion. Neck supple. No JVD present. No tracheal deviation present.  Cardiovascular: Normal rate, regular rhythm and normal heart sounds.  Respiratory: Effort normal and breath sounds normal. No stridor. No respiratory distress. She has no wheezes.  GI: Soft. Bowel sounds are normal.  Musculoskeletal:     Comments: Patient is tender to palpation over her medial and lateral joint line.  She has full extension of the left knee, flexion to about 90.  Crepitus under the patella with extension.  No laxity with varus valgus pressure.  Calf is supple.  Noticeable pitting edema, 2+.  Neurovascularly intact in the left lower extremity.  Neurological: She is alert and oriented to person, place, and time.  Skin: Skin is warm and dry. Rash noted. There is erythema.  Psychiatric: She has a normal mood and affect. Her behavior is normal. Judgment and thought content normal.    Vital signs in last 24 hours: BP: ()/()  Arterial Line BP: ()/()   Labs:   Estimated body mass index is 32.03 kg/m as calculated from the following:   Height as of 03/07/19: 5\' 7"  (1.702 m).   Weight as of 04/23/19: 92.8  kg.   Imaging Review Plain radiographs demonstrate severe degenerative joint disease of the left knee(s). The overall alignment issignificant valgus. The bone quality appears to be good for age and reported activity level.      Assessment/Plan:  End stage arthritis, left knee   The patient history, physical examination, clinical judgment of the provider and imaging studies are consistent with end stage degenerative joint disease of the left knee(s) and total knee arthroplasty is deemed medically necessary. The treatment options including medical management, injection therapy arthroscopy and arthroplasty were discussed at length. The risks and benefits of total knee arthroplasty were presented and reviewed. The risks due to aseptic loosening, infection, stiffness, patella tracking problems, thromboembolic complications and other imponderables were discussed. The patient acknowledged the explanation, agreed to proceed with the plan and consent was signed. Patient is being admitted for inpatient treatment for surgery, pain control,  PT, OT, prophylactic antibiotics, VTE prophylaxis, progressive ambulation and ADL's and discharge planning. The patient is planning to be discharged home with home health services

## 2019-06-06 ENCOUNTER — Ambulatory Visit (HOSPITAL_BASED_OUTPATIENT_CLINIC_OR_DEPARTMENT_OTHER)
Admission: RE | Admit: 2019-06-06 | Discharge: 2019-06-06 | Disposition: A | Discharge status: Home / Self Care | Payer: Medicare Other | Source: Ambulatory Visit | Attending: Internal Medicine | Admitting: Internal Medicine

## 2019-06-06 ENCOUNTER — Encounter (HOSPITAL_COMMUNITY): Payer: Self-pay | Admitting: Internal Medicine

## 2019-06-06 ENCOUNTER — Other Ambulatory Visit: Payer: Self-pay

## 2019-06-06 ENCOUNTER — Ambulatory Visit (HOSPITAL_COMMUNITY)
Admission: RE | Admit: 2019-06-06 | Discharge: 2019-06-06 | Disposition: A | Discharge status: Home / Self Care | Payer: Medicare Other | Source: Ambulatory Visit | Attending: Internal Medicine | Admitting: Internal Medicine

## 2019-06-06 VITALS — BP 119/78 | HR 78 | Wt 206.2 lb

## 2019-06-06 DIAGNOSIS — I428 Other cardiomyopathies: Secondary | ICD-10-CM | POA: Insufficient documentation

## 2019-06-06 DIAGNOSIS — I110 Hypertensive heart disease with heart failure: Secondary | ICD-10-CM | POA: Diagnosis not present

## 2019-06-06 DIAGNOSIS — I5021 Acute systolic (congestive) heart failure: Secondary | ICD-10-CM | POA: Insufficient documentation

## 2019-06-06 DIAGNOSIS — Z853 Personal history of malignant neoplasm of breast: Secondary | ICD-10-CM | POA: Diagnosis not present

## 2019-06-06 DIAGNOSIS — Z7901 Long term (current) use of anticoagulants: Secondary | ICD-10-CM | POA: Diagnosis not present

## 2019-06-06 DIAGNOSIS — R0683 Snoring: Secondary | ICD-10-CM | POA: Insufficient documentation

## 2019-06-06 DIAGNOSIS — Z79890 Hormone replacement therapy (postmenopausal): Secondary | ICD-10-CM | POA: Diagnosis not present

## 2019-06-06 DIAGNOSIS — M1990 Unspecified osteoarthritis, unspecified site: Secondary | ICD-10-CM | POA: Insufficient documentation

## 2019-06-06 DIAGNOSIS — I5022 Chronic systolic (congestive) heart failure: Secondary | ICD-10-CM

## 2019-06-06 DIAGNOSIS — K219 Gastro-esophageal reflux disease without esophagitis: Secondary | ICD-10-CM | POA: Diagnosis not present

## 2019-06-06 DIAGNOSIS — Z6832 Body mass index (BMI) 32.0-32.9, adult: Secondary | ICD-10-CM | POA: Insufficient documentation

## 2019-06-06 DIAGNOSIS — Z01810 Encounter for preprocedural cardiovascular examination: Secondary | ICD-10-CM | POA: Diagnosis not present

## 2019-06-06 DIAGNOSIS — R5383 Other fatigue: Secondary | ICD-10-CM | POA: Diagnosis not present

## 2019-06-06 DIAGNOSIS — E785 Hyperlipidemia, unspecified: Secondary | ICD-10-CM | POA: Insufficient documentation

## 2019-06-06 DIAGNOSIS — Z8249 Family history of ischemic heart disease and other diseases of the circulatory system: Secondary | ICD-10-CM | POA: Diagnosis not present

## 2019-06-06 DIAGNOSIS — I4819 Other persistent atrial fibrillation: Secondary | ICD-10-CM | POA: Diagnosis not present

## 2019-06-06 DIAGNOSIS — Z79899 Other long term (current) drug therapy: Secondary | ICD-10-CM | POA: Insufficient documentation

## 2019-06-06 DIAGNOSIS — Z923 Personal history of irradiation: Secondary | ICD-10-CM | POA: Diagnosis not present

## 2019-06-06 DIAGNOSIS — E669 Obesity, unspecified: Secondary | ICD-10-CM | POA: Diagnosis not present

## 2019-06-06 LAB — CBC
HCT: 43.1 % (ref 36.0–46.0)
Hemoglobin: 14.1 g/dL (ref 12.0–15.0)
MCH: 31.5 pg (ref 26.0–34.0)
MCHC: 32.7 g/dL (ref 30.0–36.0)
MCV: 96.4 fL (ref 80.0–100.0)
Platelets: 303 10*3/uL (ref 150–400)
RBC: 4.47 MIL/uL (ref 3.87–5.11)
RDW: 13.9 % (ref 11.5–15.5)
WBC: 5.8 10*3/uL (ref 4.0–10.5)
nRBC: 0 % (ref 0.0–0.2)

## 2019-06-06 LAB — BASIC METABOLIC PANEL
Anion gap: 11 (ref 5–15)
BUN: 33 mg/dL — ABNORMAL HIGH (ref 8–23)
CO2: 22 mmol/L (ref 22–32)
Calcium: 10.3 mg/dL (ref 8.9–10.3)
Chloride: 98 mmol/L (ref 98–111)
Creatinine, Ser: 1.24 mg/dL — ABNORMAL HIGH (ref 0.44–1.00)
GFR calc Af Amer: 48 mL/min — ABNORMAL LOW (ref >60)
GFR calc non Af Amer: 42 mL/min — ABNORMAL LOW (ref >60)
Glucose, Bld: 107 mg/dL — ABNORMAL HIGH (ref 70–99)
Potassium: 4.4 mmol/L (ref 3.5–5.1)
Sodium: 131 mmol/L — ABNORMAL LOW (ref 135–145)

## 2019-06-06 LAB — BRAIN NATRIURETIC PEPTIDE: B Natriuretic Peptide: 172.3 pg/mL — ABNORMAL HIGH (ref 0.0–100.0)

## 2019-06-06 MED ORDER — METOPROLOL SUCCINATE ER 25 MG PO TB24: 75.0000 mg | ORAL_TABLET | Freq: Every day | ORAL | 3 refills | Status: DC

## 2019-06-06 NOTE — Progress Notes (Signed)
ADVANCED [facility] CLINIC  NOTE  Referring Physician: [facility] Primary Care: Dr. Dalia Heading Primary Cardiologist: New  HPI:  Sara Leach is a 78 y/o woman with h/o obesity, breast CA (2009) - s/p XRT and partial mastectomy (no chemo), HTN , HL who is referred by Dr. Robin Searing for further evaluation of recent onset AF and systolic HF.   First found to be in AF during regular physical exam in 4/20. In June got echo at [facility] showed EF 40% with anterior and AS HK, mild MR/TR. + marked biatrial enlargement. Started on lisinopril and Toprol and Eliquis. Referred for further eval.    We saw her for the first time in 7/20 and repeat echo .with EF with anterior and anteroseptal WMA. Underwent cath as below with normal coronary arteries. EF 30-35%. Normal RHC  Sleep study still pending .   Zio placed to assess adequacy of rate control with chronic AF (10/20) 1. Continuous atrial fibrillation rates ranging from 58-181 bpm (avg of 100 bpm).  2. Rare PVCs (< 1.0%)  She returns today for routine follow-up. At last visit Toprol increased from 50-> 75 mg daily to help imprvoe AF rate control and spiro added. Was unable to tolerate higher dose due to fatigue.  Can do ADLs but feels she is mainly limited by her knee pain. No orthopnea or PND.   Echo today 30-35%   Cath 03/07/19  Ao = 135/79 (103) LV =  140/7 RA =  3  RV =  26/6 PA =  27/9 (19) PCW = 9 Fick cardiac output/index = 5.0/2.4 PVR = 2.0 WU SVR = 1587 Ao sat = 100% PA sat = 68%, 71%  Assessment: 1. Normal coronaries 2. NICM EF 30-35% 3. Well-compensated hemodynamics. No evidence of PAH   Past Medical History:  Diagnosis Date  . Atrial fibrillation ([place]) 01/2019  . Breast cancer ([place])   . Cataracts, bilateral   . GERD (gastroesophageal reflux disease)   . Hyperlipidemia   . Hypertension   . Hyperthyroidism   . Obesity   . Osteoarthritis   . Systolic heart failure ([place]) 01/2019   EF 40% by echo done 01/17/2019     Current Outpatient Medications  Medication Sig Dispense Refill  . acetaminophen (TYLENOL) 500 MG tablet Take 500 mg by mouth 2 (two) times a day.    Marland Kitchen apixaban (ELIQUIS) 5 MG TABS tablet Take 5 mg by mouth 2 (two) times daily.    . calcium carbonate (OS-CAL) 600 MG tablet Take 600 mg by mouth daily.    . Famotidine (PEPCID AC MAXIMUM STRENGTH) 20 MG CHEW Chew 1 tablet by mouth at bedtime.    . furosemide (LASIX) 40 MG tablet Take 1 tablet (40 mg total) by mouth every Monday, Wednesday, and Friday. 30 tablet 3  . levothyroxine (SYNTHROID) 75 MCG tablet Take 75 mcg by mouth daily before breakfast.    . Liniments (PAIN RELIEF EX) Apply 1 application topically 3 (three) times daily as needed (back pain.). [place] Gel - White Active Ingredients  Camphor 3.00% Menthol 5.00%    . metoprolol succinate (TOPROL-XL) 50 MG 24 hr tablet Take 1 tablet (50 mg total) by mouth at bedtime. 90 tablet 3  . Multiple Vitamins-Minerals (EMERGEN-C VITAMIN C PO) Take by mouth.    . potassium chloride SA (K-DUR) 20 MEQ tablet Take 1 tablet (20 mEq total) by mouth daily. 30 tablet 6  . sacubitril-valsartan (ENTRESTO) 24-26 MG Take 1 tablet by mouth 2 (two) times daily. [place]  tablet 3  . spironolactone (ALDACTONE) 25 MG tablet Take 0.5 tablets (12.5 mg total) by mouth daily. 45 tablet 3   No current facility-administered medications for this encounter.     No Known Allergies    Social History   Socioeconomic History  . Marital status: Married    Spouse name: Not on file  . Number of children: Not on file  . Years of education: Not on file  . Highest education level: Not on file  Occupational History  . Not on file  Social Needs  . Financial resource strain: Not on file  . Food insecurity    Worry: Not on file    Inability: Not on file  . Transportation needs    Medical: Not on file    Non-medical: Not on file  Tobacco Use  . Smoking status: Never Smoker  . Smokeless tobacco: Never Used  Substance and  Sexual Activity  . Alcohol use: Yes    Comment: occasionally has wine  . Drug use: Never  . Sexual activity: Not on file  Lifestyle  . Physical activity    Days per week: Not on file    Minutes per session: Not on file  . Stress: Not on file  Relationships  . Social Herbalist on phone: Not on file    Gets together: Not on file    Attends religious service: Not on file    Active member of club or organization: Not on file    Attends meetings of clubs or organizations: Not on file    Relationship status: Not on file  . Intimate partner violence    Fear of current or ex partner: Not on file    Emotionally abused: Not on file    Physically abused: Not on file    Forced sexual activity: Not on file  Other Topics Concern  . Not on file  Social History Narrative  . Not on file      Family History  Problem Relation Age of Onset  . Ovarian cancer Mother   . Coronary artery disease Father   . AAA (abdominal aortic aneurysm) Father 73       cause of death  . Alzheimer's disease Sister   . Alzheimer's disease Brother   . Heart defect Son        VSD, casued death at 71 m old  . Alzheimer's disease Brother   . Alzheimer's disease Brother     Vitals:   06/06/19 0954  BP: 119/78  Pulse: 78  SpO2: 98%  Weight: 93.5 kg (206 lb 3.2 oz)    PHYSICAL EXAM: General:  elderly. No resp difficulty HEENT: normal Neck: supple. no JVD. Carotids 2+ bilat; no bruits. No lymphadenopathy or thryomegaly appreciated. Cor: PMI nondisplaced. Irregular rate & rhythm. No rubs, gallops or murmurs. Lungs: clear Abdomen: obese soft, nontender, nondistended. No hepatosplenomegaly. No bruits or masses. Good bowel sounds. Extremities: no cyanosis, clubbing, rash, edema Neuro: alert & orientedx3, cranial nerves grossly intact. moves all 4 extremities w/o difficulty. Affect pleasant   ECG: AF 85. LVH LAFB Personally reviewed  ASSESSMENT & PLAN:  1. Acute systolic HF due to NICM - Echo  6/20 [facility] EF 40% with anterior and AS HK, mild MR/TR. + marked biatrial enlargement.  - Echo 7/20 EF 30% with anterior and AS HK, mild MR/TR + marked biatrial enlargement. - Cath 7/20 No CAD. EF 30-35% - Echo today EF 30-35% - NYHA II-early III - Volume status  looks good on exam and recent cath.  - Continue Entresto 49/51 bid - Increase Toprol back to 75 mg take at night - Continue lasix to 40 mg M/W/F.  - Continue spiro 12.5 - Etiology of NICM remains unclear (? AF but rate controlled, ? OSA, ? Viral). Will order cMRI - Consider EP referral for ICD - Labs today  2. Persistent AF - by echo report seems like she may have restrictive CM with severe biatrial enlargement. Suspect this may be cause of AF - Rate improved on Toprol but still a bit fast on Zio monitor. Will increase Toprol to 75 again (seem to tolerate better at night) - Continue Apixaban - Will not pursue DC-CV at this point as unlikely to hold NSR with LAE.  - Will discuss with EP. If rate control not optimized will have to discuss attempt at DC-CV vs AF ablation +/- AVN ablation and CRT-D  3. HTN - Blood pressure well controlled. Continue current regimen.  4. Fatigue/snoring - possible OSA - sleep study pending (has been ordered and is in process)  5. Pre-op risk stratification -  I think she is moderate risk for TKA. We discussed this at length with her and her family and she wants to proceed  - She is scheduled for surgery at [facility] next Friday  Glori Bickers, MD  10:24 AM

## 2019-06-06 NOTE — Progress Notes (Signed)
  Echocardiogram 2D Echocardiogram has been performed.  Sara Leach L Androw 06/06/2019, 9:50 AM

## 2019-06-06 NOTE — Patient Instructions (Signed)
INCREASE Metoprolol to 75 mg (3 tabs) daily at bedtime  Labs today We will only contact you if something comes back abnormal or we need to make some changes. Otherwise no news is good news!  From a cardiac standpoint you have been cleared to proceed for surgery, we will be more than happy to forward this information to your surgeon.   Your physician has requested that you have a cardiac MRI. Cardiac MRI uses a computer to create images of your heart as its beating, producing both still and moving pictures of your heart and major blood vessels. For further information please visit [email or web address] Please follow the instruction sheet given to you today for more information.  Your physician recommends that you schedule a follow-up appointment in: 2 months with Dr Haroldine Laws  Do the following things EVERYDAY: 1) Weigh yourself in the morning before breakfast. Write it down and keep it in a log. 2) Take your medicines as prescribed 3) Eat low salt foods-Limit salt (sodium) to 2000 mg per day.  4) Stay as active as you can everyday 5) Limit all fluids for the day to less than 2 liters   At the [facility], you and your health needs are our priority. As part of our continuing mission to provide you with exceptional heart care, we have created designated Provider Care Teams. These Care Teams include your primary Cardiologist (physician) and Advanced Practice Providers (APPs- Physician Assistants and Nurse Practitioners) who all work together to provide you with the care you need, when you need it.   You may see any of the following providers on your designated Care Team at your next follow up: Marland Kitchen Dr Glori Bickers . Dr Loralie Champagne . Darrick Grinder, NP . Lyda Jester, PA   Please be sure to bring in all your medications bottles to every appointment.

## 2019-06-07 DIAGNOSIS — M47816 Spondylosis without myelopathy or radiculopathy, lumbar region: Secondary | ICD-10-CM | POA: Diagnosis not present

## 2019-06-07 DIAGNOSIS — M5442 Lumbago with sciatica, left side: Secondary | ICD-10-CM | POA: Diagnosis not present

## 2019-06-07 DIAGNOSIS — M9903 Segmental and somatic dysfunction of lumbar region: Secondary | ICD-10-CM | POA: Diagnosis not present

## 2019-06-08 ENCOUNTER — Telehealth (HOSPITAL_COMMUNITY): Payer: Self-pay

## 2019-06-08 NOTE — Telephone Encounter (Signed)
-----   Message from Jolaine Artist, MD sent at 06/07/2019  4:20 PM EDT ----- No significant change

## 2019-06-08 NOTE — Telephone Encounter (Signed)
Pt aware of results 

## 2019-06-11 ENCOUNTER — Encounter (HOSPITAL_COMMUNITY): Payer: Medicare Other

## 2019-06-11 NOTE — Progress Notes (Signed)
[place], [place] [address] Phone: [phone] Fax: [place], [place] [address] [place] [address] Phone: [phone] Fax: [phone] - [place], [place] [address] Phone: [phone] Fax: [phone]    Your procedure is scheduled on Friday, November 6th.  Report to [facility] Main Entrance "A" at 5:30 A.M., and check in at the Admitting office.  Call this number if you have problems the morning of surgery:  [phone]  Call [phone] if you have any questions prior to your surgery date Monday-Friday 8am-4pm   Remember:  Do not eat after midnight the night before your surgery  You may drink clear liquids until 4:30 A.M. the morning of your surgery.   Clear liquids allowed are: Water, Non-Citrus Juices (without pulp), Carbonated Beverages, Clear Tea, Black Coffee Only, and Gatorade   Please complete your PRE-SURGERY ENSURE that was provided to you by 4:30 A.M. the morning of surgery.  Please, if able, drink it in one setting. DO NOT SIP.   Take these medicines the morning of surgery with A SIP OF WATER  acetaminophen (TYLENOL) 500  levothyroxine (SYNTHROID)   Follow your surgeon's instructions on when to stop/resume apixaban (ELIQUIS).  If no instructions were given by your surgeon then you will need to call the office to get those instructions.    As of today, STOP taking any Aspirin (unless otherwise instructed by your surgeon), Aleve, Naproxen, Ibuprofen, Motrin, Advil, Goody's, BC's, all herbal medications, fish oil, and all vitamins.  The Morning of Surgery  Do not wear jewelry, make-up or nail polish.  Do not wear lotions, powders, perfumes, or deodorant  Do not shave 48 hours prior to surgery.  Do not bring valuables to the hospital.  [facility] is not  responsible for any belongings or valuables.  If you are a smoker, DO NOT Smoke 24 hours prior to surgery IF you wear a CPAP at night please bring your mask, tubing, and machine the morning of surgery   Remember that you must have someone to transport you home after your surgery, and remain with you for 24 hours if you are discharged the same day.  Contacts, glasses, hearing aids, dentures or bridgework may not be worn into surgery.   Leave your suitcase in the car.  After surgery it may be brought to your room.  For patients admitted to the hospital, discharge time will be determined by your treatment team.  Patients discharged the day of surgery will not be allowed to drive home.   Special instructions:   [place]- Preparing For Surgery  Before surgery, you can play an important role. Because skin is not sterile, your skin needs to be as free of germs as possible. You can reduce the number of germs on your skin by washing with CHG (chlorahexidine gluconate) Soap before surgery.  CHG is an antiseptic cleaner which kills germs and bonds with the skin to continue killing germs even after washing.    Oral Hygiene is also important to reduce your risk of infection.  Remember - BRUSH YOUR TEETH THE MORNING OF SURGERY WITH YOUR REGULAR TOOTHPASTE  Please do not use if you have an allergy to CHG or antibacterial soaps. If your skin becomes reddened/irritated stop using the CHG.  Do not shave (including legs and underarms) for at  least 48 hours prior to first CHG shower. It is OK to shave your face.  Please follow these instructions carefully.   1. Shower the NIGHT BEFORE SURGERY and the MORNING OF SURGERY with CHG Soap.   2. If you chose to wash your hair, wash your hair first as usual with your normal shampoo.  3. After you shampoo, rinse your hair and body thoroughly to remove the shampoo.  4. Use CHG as you would any other liquid soap. You can apply CHG directly to the skin and wash  gently with a scrungie or a clean washcloth.   5. Apply the CHG Soap to your body ONLY FROM THE NECK DOWN.  Do not use on open wounds or open sores. Avoid contact with your eyes, ears, mouth and genitals (private parts). Wash Face and genitals (private parts)  with your normal soap.   6. Wash thoroughly, paying special attention to the area where your surgery will be performed.  7. Thoroughly rinse your body with warm water from the neck down.  8. DO NOT shower/wash with your normal soap after using and rinsing off the CHG Soap.  9. Pat yourself dry with a CLEAN TOWEL.  10. Wear CLEAN PAJAMAS to bed the night before surgery, wear comfortable clothes the morning of surgery  11. Place CLEAN SHEETS on your bed the night of your first shower and DO NOT SLEEP WITH PETS.  Day of Surgery: Do not apply any deodorants/lotions. Please shower the morning of surgery with the CHG soap  Please wear clean clothes to the hospital/surgery center.   Remember to brush your teeth WITH YOUR REGULAR TOOTHPASTE.  Please read over the following fact sheets that you were given.

## 2019-06-12 ENCOUNTER — Other Ambulatory Visit: Payer: Self-pay

## 2019-06-12 ENCOUNTER — Encounter (HOSPITAL_COMMUNITY): Payer: Self-pay

## 2019-06-12 ENCOUNTER — Other Ambulatory Visit (HOSPITAL_COMMUNITY)
Admission: RE | Admit: 2019-06-12 | Discharge: 2019-06-12 | Disposition: A | Discharge status: Home / Self Care | Payer: Medicare Other | Source: Ambulatory Visit | Attending: Specialist | Admitting: Specialist

## 2019-06-12 ENCOUNTER — Encounter (HOSPITAL_COMMUNITY)
Admission: RE | Admit: 2019-06-12 | Discharge: 2019-06-12 | Disposition: A | Discharge status: Home / Self Care | Payer: Medicare Other | Source: Ambulatory Visit | Attending: Specialist | Admitting: Specialist

## 2019-06-12 DIAGNOSIS — I5022 Chronic systolic (congestive) heart failure: Secondary | ICD-10-CM | POA: Diagnosis not present

## 2019-06-12 DIAGNOSIS — I428 Other cardiomyopathies: Secondary | ICD-10-CM | POA: Insufficient documentation

## 2019-06-12 DIAGNOSIS — E785 Hyperlipidemia, unspecified: Secondary | ICD-10-CM | POA: Diagnosis not present

## 2019-06-12 DIAGNOSIS — K219 Gastro-esophageal reflux disease without esophagitis: Secondary | ICD-10-CM | POA: Diagnosis not present

## 2019-06-12 DIAGNOSIS — M1712 Unilateral primary osteoarthritis, left knee: Secondary | ICD-10-CM | POA: Diagnosis not present

## 2019-06-12 DIAGNOSIS — M25762 Osteophyte, left knee: Secondary | ICD-10-CM | POA: Diagnosis not present

## 2019-06-12 DIAGNOSIS — G8918 Other acute postprocedural pain: Secondary | ICD-10-CM | POA: Diagnosis not present

## 2019-06-12 DIAGNOSIS — Z79899 Other long term (current) drug therapy: Secondary | ICD-10-CM | POA: Diagnosis not present

## 2019-06-12 DIAGNOSIS — Z7901 Long term (current) use of anticoagulants: Secondary | ICD-10-CM | POA: Diagnosis not present

## 2019-06-12 DIAGNOSIS — Z79890 Hormone replacement therapy (postmenopausal): Secondary | ICD-10-CM | POA: Diagnosis not present

## 2019-06-12 DIAGNOSIS — Z01818 Encounter for other preprocedural examination: Secondary | ICD-10-CM | POA: Insufficient documentation

## 2019-06-12 DIAGNOSIS — Z853 Personal history of malignant neoplasm of breast: Secondary | ICD-10-CM | POA: Diagnosis not present

## 2019-06-12 DIAGNOSIS — Z9011 Acquired absence of right breast and nipple: Secondary | ICD-10-CM | POA: Diagnosis not present

## 2019-06-12 DIAGNOSIS — I110 Hypertensive heart disease with heart failure: Secondary | ICD-10-CM | POA: Diagnosis not present

## 2019-06-12 DIAGNOSIS — Z8249 Family history of ischemic heart disease and other diseases of the circulatory system: Secondary | ICD-10-CM | POA: Diagnosis not present

## 2019-06-12 DIAGNOSIS — Z20828 Contact with and (suspected) exposure to other viral communicable diseases: Secondary | ICD-10-CM | POA: Diagnosis not present

## 2019-06-12 DIAGNOSIS — I5020 Unspecified systolic (congestive) heart failure: Secondary | ICD-10-CM | POA: Insufficient documentation

## 2019-06-12 DIAGNOSIS — E039 Hypothyroidism, unspecified: Secondary | ICD-10-CM | POA: Diagnosis not present

## 2019-06-12 DIAGNOSIS — Z96651 Presence of right artificial knee joint: Secondary | ICD-10-CM | POA: Diagnosis not present

## 2019-06-12 DIAGNOSIS — I4891 Unspecified atrial fibrillation: Secondary | ICD-10-CM | POA: Diagnosis not present

## 2019-06-12 LAB — SURGICAL PCR SCREEN
MRSA, PCR: NEGATIVE
Staphylococcus aureus: NEGATIVE

## 2019-06-12 NOTE — Anesthesia Preprocedure Evaluation (Addendum)
Anesthesia Evaluation  Patient identified by MRN, date of birth, ID band Patient awake    Reviewed: Allergy & Precautions, NPO status , Patient's Chart, lab work & pertinent test results  History of Anesthesia Complications Negative for: history of anesthetic complications  Airway Mallampati: II  TM Distance: >3 FB Neck ROM: Full    Dental  (+) Dental Advisory Given, Teeth Intact, Implants   Pulmonary neg pulmonary ROS,    Pulmonary exam normal        Cardiovascular hypertension, Pt. on medications and Pt. on home beta blockers +CHF  (-) CAD + dysrhythmias Atrial Fibrillation  Rhythm:Irregular Rate:Normal   Last dose of eliquis in evening of 11/3  '20 TTE - EF 30 to 35%. Global right ventricle has mildly reduced systolic function. LA size was mildly dilated. Trivial TR.   '20 Event monitor - Continuous Atrial Fibrillation rates ranging from 58-181 bpm (avg of 100 bpm). LBBB. Rare PVCs (< 1.0%)  '20 Cath - Normal coronaries. NICM EF 30-35%. No evidence of PAH  Last seen by Dr. Haroldine Laws 06/06/19. Per OV note "I think she is moderate risk for TKA. We discussed this at length with her and her family and she wants to proceed."  '20 EKG - Atrial fibrillation. Vent rate 85. LAFB. Minimal voltage criteria for LVH, may be normal variant. Possible Anterolateral infarct , age undetermined. No significant change since last tracing.   Neuro/Psych negative neurological ROS  negative psych ROS   GI/Hepatic Neg liver ROS, GERD  Controlled and Medicated,  Endo/Other  Hypothyroidism  Obesity Hyponatremia   Renal/GU Renal InsufficiencyRenal disease     Musculoskeletal  (+) Arthritis ,   Abdominal   Peds  Hematology negative hematology ROS (+)   Anesthesia Other Findings Cataracts b/l   Reproductive/Obstetrics                           Anesthesia Physical Anesthesia Plan  ASA: III  Anesthesia  Plan: General   Post-op Pain Management:  Regional for Post-op pain   Induction: Intravenous  PONV Risk Score and Plan: 3 and Treatment may vary due to age or medical condition, Ondansetron and Propofol infusion  Airway Management Planned: LMA  Additional Equipment: None  Intra-op Plan:   Post-operative Plan: Extubation in OR  Informed Consent: I have reviewed the patients History and Physical, chart, labs and discussed the procedure including the risks, benefits and alternatives for the proposed anesthesia with the patient or authorized representative who has indicated his/her understanding and acceptance.     Dental advisory given  Plan Discussed with: CRNA and Anesthesiologist  Anesthesia Plan Comments: (Given eliquis last taken under 72 hours ago, will proceed with general anesthesia )   Anesthesia Quick Evaluation

## 2019-06-12 NOTE — Progress Notes (Signed)
Anesthesia Chart Review: Follows with cardiology for recent onset afib and recent onset HFrEF due to NICM. Echo 6/20 [facility] EF 40% with anterior and AS HK, mild MR/TR. + marked biatrial enlargement. Echo 7/20 EF 30% with anterior and AS HK, mild MR/TR + marked biatrial enlargement. Cath 7/20 No CAD. EF 30-35%. Last seen by Dr. Haroldine Laws 06/06/19. Per OV note "I think she is moderate risk for TKA. We discussed this at length with her and her family and she wants to proceed."  Reviewed labs ordered by Dr. Haroldine Laws on 06/06/19, mildly elevated creatinine at 1.24, mildly elevated BNP at 172.3. Otherwise unremarkable.  EKG 06/06/19: Atrial fibrillation. Vent rate 85. LAFB. Minimal voltage criteria for LVH, may be normal variant. Possible Anterolateral infarct , age undetermined. No significant change since last tracing.  TTE 06/06/19: 1. Left ventricular ejection fraction, by visual estimation, is 30 to 35%. The left ventricle has severely decreased function. There is no left ventricular hypertrophy.  2. Global right ventricle has mildly reduced systolic function.The right ventricular size is normal. No increase in right ventricular wall thickness.  3. Left atrial size was mildly dilated.  4. Right atrial size was normal.  5. The mitral valve is normal in structure. No evidence of mitral valve regurgitation.  6. The tricuspid valve is normal in structure. Tricuspid valve regurgitation is trivial.  7. The aortic valve is tricuspid. Aortic valve regurgitation is not visualized.  8. The pulmonic valve was not well visualized. Pulmonic valve regurgitation is not visualized.  9. The inferior vena cava is normal in size with greater than 50% respiratory variability, suggesting right atrial pressure of 3 mmHg. 10. The tricuspid regurgitant velocity is 2.29 m/s, and with an assumed right atrial pressure of 3 mmHg, the estimated right ventricular systolic pressure is normal at 24.0 mmHg.  Event monitor  05/15/19: 1. Continuous Atrial Fibrillation rates ranging from 58-181 bpm (avg of 100 bpm).  2. LBBBB Bundle Branch Block 3. Rare PVCs (< 1.0%)  Cath 03/07/19: Assessment: 1. Normal coronaries 2. NICM EF 30-35% 3. Well-compensated hemodynamics. No evidence of PAH  Plan/Discussion:  Medical therapy   Wynonia Musty [facility] Short Stay Center/Anesthesiology Phone [phone] 06/12/2019 2:53 PM

## 2019-06-12 NOTE — Progress Notes (Signed)
PCP - Richardson Landry [email or web address] in [place] - [email or web address]   Chest x-ray - na EKG - 06/06/19 Stress Test - na ECHO - 06/06/19 Cardiac Cath - 03/07/19  Sleep Study - ordered by Dr. Haroldine Laws ,study done last week--pending   Fasting Blood Sugar - na Checks Blood Sugar _____ times a day  Blood Thinner Instructions:stop eliquis 2 days prioe to surgery Aspirin Instructions:  ERAS Protcol - yes PRE-SURGERY Ensure or G2- ensure  COVID TEST- today   Anesthesia review: heart hx.  Patient denies shortness of breath, fever, cough and chest pain at PAT appointment   All instructions explained to the patient, with a verbal understanding of the material. Patient agrees to go over the instructions while at home for a better understanding. Patient also instructed to self quarantine after being tested for COVID-19. The opportunity to ask questions was provided.

## 2019-06-13 LAB — NOVEL CORONAVIRUS, NAA (HOSP ORDER, SEND-OUT TO REF LAB; TAT 18-24 HRS): SARS-CoV-2, NAA: NOT DETECTED

## 2019-06-15 ENCOUNTER — Inpatient Hospital Stay (HOSPITAL_COMMUNITY)
Admission: RE | Admit: 2019-06-15 | Discharge: 2019-06-16 | DRG: 470 | Disposition: A | Discharge status: Home / Self Care | Payer: Medicare Other | Attending: Specialist | Admitting: Specialist

## 2019-06-15 ENCOUNTER — Inpatient Hospital Stay (HOSPITAL_COMMUNITY): Payer: Medicare Other | Admitting: Physician Assistant

## 2019-06-15 ENCOUNTER — Other Ambulatory Visit: Payer: Self-pay

## 2019-06-15 ENCOUNTER — Encounter (HOSPITAL_COMMUNITY): Payer: Self-pay

## 2019-06-15 ENCOUNTER — Inpatient Hospital Stay (HOSPITAL_COMMUNITY): Payer: Medicare Other | Admitting: Anesthesiology

## 2019-06-15 ENCOUNTER — Encounter (HOSPITAL_COMMUNITY)
Admission: RE | Disposition: A | Discharge status: Home / Self Care | Payer: Self-pay | Source: Home / Self Care | Attending: Specialist

## 2019-06-15 DIAGNOSIS — M25762 Osteophyte, left knee: Secondary | ICD-10-CM | POA: Diagnosis present

## 2019-06-15 DIAGNOSIS — Z853 Personal history of malignant neoplasm of breast: Secondary | ICD-10-CM

## 2019-06-15 DIAGNOSIS — I5022 Chronic systolic (congestive) heart failure: Secondary | ICD-10-CM | POA: Diagnosis present

## 2019-06-15 DIAGNOSIS — E785 Hyperlipidemia, unspecified: Secondary | ICD-10-CM | POA: Diagnosis present

## 2019-06-15 DIAGNOSIS — Z8249 Family history of ischemic heart disease and other diseases of the circulatory system: Secondary | ICD-10-CM | POA: Diagnosis not present

## 2019-06-15 DIAGNOSIS — Z79899 Other long term (current) drug therapy: Secondary | ICD-10-CM

## 2019-06-15 DIAGNOSIS — Z9011 Acquired absence of right breast and nipple: Secondary | ICD-10-CM | POA: Diagnosis not present

## 2019-06-15 DIAGNOSIS — I110 Hypertensive heart disease with heart failure: Secondary | ICD-10-CM | POA: Diagnosis present

## 2019-06-15 DIAGNOSIS — Z20828 Contact with and (suspected) exposure to other viral communicable diseases: Secondary | ICD-10-CM | POA: Diagnosis present

## 2019-06-15 DIAGNOSIS — Z96651 Presence of right artificial knee joint: Secondary | ICD-10-CM | POA: Diagnosis present

## 2019-06-15 DIAGNOSIS — K219 Gastro-esophageal reflux disease without esophagitis: Secondary | ICD-10-CM | POA: Diagnosis present

## 2019-06-15 DIAGNOSIS — M1712 Unilateral primary osteoarthritis, left knee: Secondary | ICD-10-CM | POA: Diagnosis present

## 2019-06-15 DIAGNOSIS — Z79890 Hormone replacement therapy (postmenopausal): Secondary | ICD-10-CM | POA: Diagnosis not present

## 2019-06-15 DIAGNOSIS — Z7901 Long term (current) use of anticoagulants: Secondary | ICD-10-CM

## 2019-06-15 DIAGNOSIS — E039 Hypothyroidism, unspecified: Secondary | ICD-10-CM | POA: Diagnosis present

## 2019-06-15 DIAGNOSIS — I4891 Unspecified atrial fibrillation: Secondary | ICD-10-CM | POA: Diagnosis present

## 2019-06-15 HISTORY — PX: TOTAL KNEE ARTHROPLASTY: SHX125

## 2019-06-15 LAB — PROTIME-INR
INR: 1 (ref 0.8–1.2)
Prothrombin Time: 13.3 seconds (ref 11.4–15.2)

## 2019-06-15 SURGERY — ARTHROPLASTY, KNEE, TOTAL
Anesthesia: Spinal | Site: Knee | Laterality: Left

## 2019-06-15 MED ORDER — METOPROLOL TARTRATE 5 MG/5ML IV SOLN
INTRAVENOUS | Status: DC | PRN
  Administered 2019-06-15: 1 mg via INTRAVENOUS
  Administered 2019-06-15 (×2): 2 mg via INTRAVENOUS

## 2019-06-15 MED ORDER — METOPROLOL TARTRATE 5 MG/5ML IV SOLN
INTRAVENOUS | Status: AC
  Filled 2019-06-15: qty 5

## 2019-06-15 MED ORDER — TRANEXAMIC ACID-NACL 1000-0.7 MG/100ML-% IV SOLN
1000.0000 mg | INTRAVENOUS | Status: AC
  Administered 2019-06-15: 1000 mg via INTRAVENOUS

## 2019-06-15 MED ORDER — DEXAMETHASONE SODIUM PHOSPHATE 10 MG/ML IJ SOLN
INTRAMUSCULAR | Status: DC | PRN
  Administered 2019-06-15: 10 mg via INTRAVENOUS

## 2019-06-15 MED ORDER — METHOCARBAMOL 500 MG PO TABS
ORAL_TABLET | ORAL | Status: AC
  Filled 2019-06-15: qty 1

## 2019-06-15 MED ORDER — ACETAMINOPHEN 325 MG PO TABS: 325.0000 mg | ORAL_TABLET | Freq: Four times a day (QID) | ORAL | Status: DC | PRN

## 2019-06-15 MED ORDER — OXYCODONE HCL 5 MG/5ML PO SOLN: 5.0000 mg | Freq: Once | ORAL | Status: DC | PRN

## 2019-06-15 MED ORDER — PROPOFOL 500 MG/50ML IV EMUL
INTRAVENOUS | Status: DC | PRN
  Administered 2019-06-15: 20 ug/kg/min via INTRAVENOUS

## 2019-06-15 MED ORDER — CEFAZOLIN SODIUM-DEXTROSE 2-4 GM/100ML-% IV SOLN
INTRAVENOUS | Status: AC
  Filled 2019-06-15: qty 100

## 2019-06-15 MED ORDER — BISACODYL 5 MG PO TBEC: 5.0000 mg | DELAYED_RELEASE_TABLET | Freq: Every day | ORAL | Status: DC | PRN

## 2019-06-15 MED ORDER — PROPOFOL 1000 MG/100ML IV EMUL
INTRAVENOUS | Status: AC
  Filled 2019-06-15: qty 100

## 2019-06-15 MED ORDER — FENTANYL CITRATE (PF) 250 MCG/5ML IJ SOLN
INTRAMUSCULAR | Status: AC
  Filled 2019-06-15: qty 5

## 2019-06-15 MED ORDER — LIDOCAINE 2% (20 MG/ML) 5 ML SYRINGE
INTRAMUSCULAR | Status: AC
  Filled 2019-06-15: qty 5

## 2019-06-15 MED ORDER — ONDANSETRON HCL 4 MG/2ML IJ SOLN
INTRAMUSCULAR | Status: DC | PRN
  Administered 2019-06-15: 4 mg via INTRAVENOUS

## 2019-06-15 MED ORDER — ONDANSETRON HCL 4 MG/2ML IJ SOLN
INTRAMUSCULAR | Status: AC
  Filled 2019-06-15: qty 2

## 2019-06-15 MED ORDER — SENNOSIDES-DOCUSATE SODIUM 8.6-50 MG PO TABS: 1.0000 | ORAL_TABLET | Freq: Every evening | ORAL | Status: DC | PRN

## 2019-06-15 MED ORDER — FENTANYL CITRATE (PF) 100 MCG/2ML IJ SOLN
INTRAMUSCULAR | Status: DC | PRN
  Administered 2019-06-15 (×5): 25 ug via INTRAVENOUS
  Administered 2019-06-15: 50 ug via INTRAVENOUS
  Administered 2019-06-15 (×3): 25 ug via INTRAVENOUS

## 2019-06-15 MED ORDER — LACTATED RINGERS IV SOLN
INTRAVENOUS | Status: DC | PRN
  Administered 2019-06-15 (×2): via INTRAVENOUS

## 2019-06-15 MED ORDER — MIDAZOLAM HCL 2 MG/2ML IJ SOLN
INTRAMUSCULAR | Status: AC
  Filled 2019-06-15: qty 2

## 2019-06-15 MED ORDER — PROPOFOL 10 MG/ML IV BOLUS
INTRAVENOUS | Status: DC | PRN
  Administered 2019-06-15: 100 mg via INTRAVENOUS

## 2019-06-15 MED ORDER — BUPIVACAINE HCL (PF) 0.25 % IJ SOLN
INTRAMUSCULAR | Status: AC
  Filled 2019-06-15: qty 30

## 2019-06-15 MED ORDER — PHENYLEPHRINE 40 MCG/ML (10ML) SYRINGE FOR IV PUSH (FOR BLOOD PRESSURE SUPPORT)
PREFILLED_SYRINGE | INTRAVENOUS | Status: DC | PRN
  Administered 2019-06-15 (×5): 80 ug via INTRAVENOUS

## 2019-06-15 MED ORDER — OXYCODONE HCL 5 MG PO TABS
ORAL_TABLET | ORAL | Status: AC
  Filled 2019-06-15: qty 2

## 2019-06-15 MED ORDER — ACETAMINOPHEN 500 MG PO TABS
1000.0000 mg | ORAL_TABLET | Freq: Four times a day (QID) | ORAL | Status: AC
  Administered 2019-06-15 – 2019-06-16 (×4): 1000 mg via ORAL
  Filled 2019-06-15 (×3): qty 2

## 2019-06-15 MED ORDER — SODIUM CHLORIDE 0.9 % IR SOLN
Status: DC | PRN
  Administered 2019-06-15: 1

## 2019-06-15 MED ORDER — FENTANYL CITRATE (PF) 100 MCG/2ML IJ SOLN
INTRAMUSCULAR | Status: AC
  Administered 2019-06-15: 50 ug via INTRAVENOUS
  Filled 2019-06-15: qty 2

## 2019-06-15 MED ORDER — BUPIVACAINE LIPOSOME 1.3 % IJ SUSP
20.0000 mL | INTRAMUSCULAR | Status: AC
  Administered 2019-06-15: 20 mL
  Filled 2019-06-15: qty 20

## 2019-06-15 MED ORDER — HYDROMORPHONE HCL 1 MG/ML IJ SOLN: 0.5000 mg | INTRAMUSCULAR | Status: DC | PRN

## 2019-06-15 MED ORDER — TRANEXAMIC ACID-NACL 1000-0.7 MG/100ML-% IV SOLN
INTRAVENOUS | Status: AC
  Filled 2019-06-15: qty 100

## 2019-06-15 MED ORDER — ONDANSETRON HCL 4 MG/2ML IJ SOLN: 4.0000 mg | Freq: Once | INTRAMUSCULAR | Status: DC | PRN

## 2019-06-15 MED ORDER — LIDOCAINE 2% (20 MG/ML) 5 ML SYRINGE
INTRAMUSCULAR | Status: DC | PRN
  Administered 2019-06-15: 60 mg via INTRAVENOUS

## 2019-06-15 MED ORDER — CEFAZOLIN SODIUM-DEXTROSE 2-4 GM/100ML-% IV SOLN
2.0000 g | INTRAVENOUS | Status: AC
  Administered 2019-06-15: 08:00:00 2 g via INTRAVENOUS

## 2019-06-15 MED ORDER — FENTANYL CITRATE (PF) 100 MCG/2ML IJ SOLN
25.0000 ug | INTRAMUSCULAR | Status: DC | PRN
  Administered 2019-06-15 (×2): 50 ug via INTRAVENOUS

## 2019-06-15 MED ORDER — ONDANSETRON HCL 4 MG PO TABS: 4.0000 mg | ORAL_TABLET | Freq: Every day | ORAL | 1 refills | Status: DC | PRN

## 2019-06-15 MED ORDER — OXYCODONE HCL 5 MG PO TABS: 10.0000 mg | ORAL_TABLET | ORAL | Status: DC | PRN

## 2019-06-15 MED ORDER — POVIDONE-IODINE 10 % EX SWAB
2.0000 "application " | Freq: Once | CUTANEOUS | Status: AC
  Administered 2019-06-15: 2 via TOPICAL

## 2019-06-15 MED ORDER — OXYCODONE HCL ER 10 MG PO T12A
10.0000 mg | EXTENDED_RELEASE_TABLET | Freq: Two times a day (BID) | ORAL | Status: DC
  Administered 2019-06-15 – 2019-06-16 (×2): 10 mg via ORAL
  Filled 2019-06-15 (×2): qty 1

## 2019-06-15 MED ORDER — OXYCODONE HCL 5 MG PO TABS: 5.0000 mg | ORAL_TABLET | Freq: Once | ORAL | Status: DC | PRN

## 2019-06-15 MED ORDER — MAGNESIUM CITRATE PO SOLN: 1.0000 | Freq: Once | ORAL | Status: DC | PRN

## 2019-06-15 MED ORDER — TRAMADOL HCL 50 MG PO TABS
50.0000 mg | ORAL_TABLET | Freq: Four times a day (QID) | ORAL | Status: DC
  Administered 2019-06-15: 50 mg via ORAL
  Filled 2019-06-15: qty 1

## 2019-06-15 MED ORDER — PROPOFOL 10 MG/ML IV BOLUS
INTRAVENOUS | Status: AC
  Filled 2019-06-15: qty 20

## 2019-06-15 MED ORDER — CEFAZOLIN SODIUM-DEXTROSE 1-4 GM/50ML-% IV SOLN
1.0000 g | Freq: Three times a day (TID) | INTRAVENOUS | Status: AC
  Administered 2019-06-15 – 2019-06-16 (×3): 1 g via INTRAVENOUS
  Filled 2019-06-15 (×3): qty 50

## 2019-06-15 MED ORDER — CHLORHEXIDINE GLUCONATE 4 % EX LIQD: 60.0000 mL | Freq: Once | CUTANEOUS | Status: DC

## 2019-06-15 MED ORDER — OXYCODONE HCL 5 MG PO TABS: 5.0000 mg | ORAL_TABLET | Freq: Three times a day (TID) | ORAL | 0 refills | Status: DC | PRN

## 2019-06-15 MED ORDER — METHOCARBAMOL 500 MG PO TABS
500.0000 mg | ORAL_TABLET | Freq: Four times a day (QID) | ORAL | Status: DC | PRN
  Administered 2019-06-15 (×2): 500 mg via ORAL
  Filled 2019-06-15: qty 1

## 2019-06-15 MED ORDER — OXYCODONE HCL 5 MG PO TABS
5.0000 mg | ORAL_TABLET | ORAL | Status: DC | PRN
  Administered 2019-06-15 (×2): 5 mg via ORAL
  Administered 2019-06-15: 10 mg via ORAL
  Administered 2019-06-16: 5 mg via ORAL
  Filled 2019-06-15 (×3): qty 1

## 2019-06-15 MED ORDER — ROPIVACAINE HCL 7.5 MG/ML IJ SOLN
INTRAMUSCULAR | Status: DC | PRN
  Administered 2019-06-15: 20 mL via PERINEURAL

## 2019-06-15 MED ORDER — METHOCARBAMOL 1000 MG/10ML IJ SOLN
500.0000 mg | Freq: Four times a day (QID) | INTRAVENOUS | Status: DC | PRN
  Filled 2019-06-15: qty 5

## 2019-06-15 MED ORDER — APIXABAN 5 MG PO TABS: 5.0000 mg | ORAL_TABLET | Freq: Two times a day (BID) | ORAL | Status: DC

## 2019-06-15 MED ORDER — PHENYLEPHRINE HCL-NACL 10-0.9 MG/250ML-% IV SOLN
INTRAVENOUS | Status: DC | PRN
  Administered 2019-06-15: 25 ug/min via INTRAVENOUS

## 2019-06-15 MED ORDER — DEXAMETHASONE SODIUM PHOSPHATE 10 MG/ML IJ SOLN
INTRAMUSCULAR | Status: AC
  Filled 2019-06-15: qty 1

## 2019-06-15 MED ORDER — KETOROLAC TROMETHAMINE 30 MG/ML IJ SOLN
INTRAMUSCULAR | Status: AC
  Filled 2019-06-15: qty 1

## 2019-06-15 SURGICAL SUPPLY — 92 items
ADH SKN CLS APL DERMABOND .7 (GAUZE/BANDAGES/DRESSINGS) ×1
AGENT HMST KT MTR STRL THRMB (HEMOSTASIS)
ATTUNE PSFEM LTSZ6 NARCEM KNEE (Femur) ×1 IMPLANT
ATTUNE PSRP INSR SZ6 6 KNEE (Insert) ×1 IMPLANT
BANDAGE ESMARK 6X9 LF (GAUZE/BANDAGES/DRESSINGS) ×1 IMPLANT
BASE TIBIA ATTUNE KNEE SYS SZ6 (Knees) IMPLANT
BLADE SAW SGTL 13.0X1.19X90.0M (BLADE) ×2 IMPLANT
BLADE SAW SGTL 13X75X1.27 (BLADE) ×2 IMPLANT
BNDG CMPR 9X6 STRL LF SNTH (GAUZE/BANDAGES/DRESSINGS) ×1
BNDG CMPR MED 10X6 ELC LF (GAUZE/BANDAGES/DRESSINGS) ×1
BNDG ELASTIC 6X10 VLCR STRL LF (GAUZE/BANDAGES/DRESSINGS) ×2 IMPLANT
BNDG ELASTIC 6X5.8 VLCR STR LF (GAUZE/BANDAGES/DRESSINGS) ×1 IMPLANT
BNDG ESMARK 6X9 LF (GAUZE/BANDAGES/DRESSINGS) ×2
BOWL SMART MIX CTS (DISPOSABLE) ×2 IMPLANT
BSPLAT TIB 6 CMNT ROT PLAT STR (Knees) ×1 IMPLANT
CEMENT HV SMART SET (Cement) ×2 IMPLANT
COVER SURGICAL LIGHT HANDLE (MISCELLANEOUS) ×2 IMPLANT
COVER WAND RF STERILE (DRAPES) ×2 IMPLANT
CUFF TOURN SGL QUICK 34 (TOURNIQUET CUFF) ×2
CUFF TOURN SGL QUICK 42 (TOURNIQUET CUFF) IMPLANT
CUFF TRNQT CYL 34X4.125X (TOURNIQUET CUFF) IMPLANT
DERMABOND ADVANCED (GAUZE/BANDAGES/DRESSINGS) ×1
DERMABOND ADVANCED .7 DNX12 (GAUZE/BANDAGES/DRESSINGS) ×1 IMPLANT
DRAPE HALF SHEET 40X57 (DRAPES) ×2 IMPLANT
DRAPE IMP U-DRAPE 54X76 (DRAPES) ×2 IMPLANT
DRAPE U-SHAPE 47X51 STRL (DRAPES) ×1 IMPLANT
DRSG ADAPTIC 3X8 NADH LF (GAUZE/BANDAGES/DRESSINGS) IMPLANT
DRSG AQUACEL AG ADV 3.5X10 (GAUZE/BANDAGES/DRESSINGS) ×2 IMPLANT
DRSG AQUACEL AG ADV 3.5X14 (GAUZE/BANDAGES/DRESSINGS) ×1 IMPLANT
DRSG PAD ABDOMINAL 8X10 ST (GAUZE/BANDAGES/DRESSINGS) IMPLANT
DRSG TEGADERM 4X4.75 (GAUZE/BANDAGES/DRESSINGS) IMPLANT
ELECT REM PT RETURN 9FT ADLT (ELECTROSURGICAL) ×2
ELECTRODE REM PT RTRN 9FT ADLT (ELECTROSURGICAL) ×1 IMPLANT
EVACUATOR 1/8 PVC DRAIN (DRAIN) IMPLANT
FACESHIELD STD STERILE (MASK) ×4 IMPLANT
FACESHIELD WRAPAROUND (MASK) ×2 IMPLANT
GAUZE SPONGE 4X4 12PLY STRL (GAUZE/BANDAGES/DRESSINGS) ×2 IMPLANT
GLOVE BIOGEL PI IND STRL 6.5 (GLOVE) ×1 IMPLANT
GLOVE BIOGEL PI IND STRL 7.5 (GLOVE) ×1 IMPLANT
GLOVE BIOGEL PI IND STRL 8.5 (GLOVE) ×1 IMPLANT
GLOVE BIOGEL PI INDICATOR 6.5 (GLOVE) ×1
GLOVE BIOGEL PI INDICATOR 7.5 (GLOVE) ×1
GLOVE BIOGEL PI INDICATOR 8.5 (GLOVE) ×1
GLOVE ECLIPSE 6.5 STRL STRAW (GLOVE) ×2 IMPLANT
GLOVE ECLIPSE 7.5 STRL STRAW (GLOVE) ×2 IMPLANT
GLOVE ORTHO TXT STRL SZ7.5 (GLOVE) ×4 IMPLANT
GLOVE SURG ORTHO 8.5 STRL (GLOVE) ×2 IMPLANT
GOWN STRL REIN 3XL XLG LVL4 (GOWN DISPOSABLE) ×2 IMPLANT
GOWN STRL REUS W/ TWL LRG LVL3 (GOWN DISPOSABLE) ×2 IMPLANT
GOWN STRL REUS W/TWL LRG LVL3 (GOWN DISPOSABLE) ×4
HANDPIECE INTERPULSE COAX TIP (DISPOSABLE) ×2
IMMOBILIZER KNEE 20 (SOFTGOODS) ×2
IMMOBILIZER KNEE 20 THIGH 36 (SOFTGOODS) IMPLANT
JET LAVAGE IRRISEPT WOUND (IRRIGATION / IRRIGATOR) ×2
KIT BASIN OR (CUSTOM PROCEDURE TRAY) ×2 IMPLANT
KIT TURNOVER KIT B (KITS) ×2 IMPLANT
LAVAGE JET IRRISEPT WOUND (IRRIGATION / IRRIGATOR) IMPLANT
MANIFOLD NEPTUNE II (INSTRUMENTS) ×2 IMPLANT
NDL 18GX1X1/2 (RX/OR ONLY) (NEEDLE) ×1 IMPLANT
NEEDLE 18GX1X1/2 (RX/OR ONLY) (NEEDLE) ×2 IMPLANT
NS IRRIG 1000ML POUR BTL (IV SOLUTION) ×2 IMPLANT
PACK TOTAL JOINT (CUSTOM PROCEDURE TRAY) ×2 IMPLANT
PACK UNIVERSAL I (CUSTOM PROCEDURE TRAY) ×2 IMPLANT
PAD ARMBOARD 7.5X6 YLW CONV (MISCELLANEOUS) ×4 IMPLANT
PADDING CAST COTTON 6X4 STRL (CAST SUPPLIES) ×1 IMPLANT
PATELLA MEDIAL ATTUN 35MM KNEE (Knees) ×2 IMPLANT
PIN DRILL FIX HALF THREAD (BIT) ×1 IMPLANT
PIN STEINMAN FIXATION KNEE (PIN) ×1 IMPLANT
SET HNDPC FAN SPRY TIP SCT (DISPOSABLE) ×1 IMPLANT
SET PAD KNEE POSITIONER (MISCELLANEOUS) ×2 IMPLANT
SPONGE LAP 18X18 RF (DISPOSABLE) ×1 IMPLANT
SPONGE SURGIFOAM ABS GEL 100 (HEMOSTASIS) ×1 IMPLANT
STRIP CLOSURE SKIN 1/2X4 (GAUZE/BANDAGES/DRESSINGS) IMPLANT
SUCTION FRAZIER HANDLE 10FR (MISCELLANEOUS) ×1
SUCTION TUBE FRAZIER 10FR DISP (MISCELLANEOUS) ×1 IMPLANT
SURGIFLO W/THROMBIN 8M KIT (HEMOSTASIS) IMPLANT
SUT MNCRL AB 3-0 PS2 18 (SUTURE) ×2 IMPLANT
SUT MNCRL AB 4-0 PS2 18 (SUTURE) ×2 IMPLANT
SUT VIC AB 1 CT1 27 (SUTURE) ×6
SUT VIC AB 1 CT1 27XBRD ANBCTR (SUTURE) ×2 IMPLANT
SUT VIC AB 1 CT1 27XBRD ANTBC (SUTURE) IMPLANT
SUT VIC AB 2-0 CT1 27 (SUTURE) ×4
SUT VIC AB 2-0 CT1 TAPERPNT 27 (SUTURE) ×2 IMPLANT
SUT VLOC 180 0 24IN GS25 (SUTURE) ×2 IMPLANT
SYR 50ML LL SCALE MARK (SYRINGE) ×4 IMPLANT
TIBIA ATTUNE KNEE SYS BASE SZ6 (Knees) ×2 IMPLANT
TOWEL GREEN STERILE (TOWEL DISPOSABLE) ×2 IMPLANT
TOWEL GREEN STERILE FF (TOWEL DISPOSABLE) ×2 IMPLANT
TRAY CATH 16FR W/PLASTIC CATH (SET/KITS/TRAYS/PACK) IMPLANT
TRAY FOLEY MTR SLVR 16FR STAT (SET/KITS/TRAYS/PACK) IMPLANT
WATER STERILE IRR 1000ML POUR (IV SOLUTION) ×6 IMPLANT
WRAP KNEE MAXI GEL POST OP (GAUZE/BANDAGES/DRESSINGS) ×2 IMPLANT

## 2019-06-15 NOTE — Plan of Care (Signed)
  Problem: Activity: Goal: Risk for activity intolerance will decrease Outcome: Progressing   Problem: Pain Managment: Goal: General experience of comfort will improve Outcome: Progressing   Problem: Safety: Goal: Ability to remain free from injury will improve Outcome: Progressing   

## 2019-06-15 NOTE — Anesthesia Postprocedure Evaluation (Signed)
Anesthesia Post Note  Patient: Sara Leach  Procedure(s) Performed: TOTAL KNEE ARTHROPLASTY WITH ADDUCTOR CANAL (Left Knee)     Patient location during evaluation: PACU Anesthesia Type: General Level of consciousness: awake and alert Pain management: pain level controlled Vital Signs Assessment: post-procedure vital signs reviewed and stable Respiratory status: spontaneous breathing, nonlabored ventilation and respiratory function stable Cardiovascular status: blood pressure returned to baseline and stable Postop Assessment: no apparent nausea or vomiting Anesthetic complications: no    Last Vitals:  Vitals:   06/15/19 1230 06/15/19 1330  BP: 95/60 102/69  Pulse: 97 (!) 44  Resp: 16 10  Temp:  (!) 36.3 C  SpO2: 97% 100%    Last Pain:  Vitals:   06/15/19 1230  TempSrc:   PainSc: [place]

## 2019-06-15 NOTE — Anesthesia Procedure Notes (Signed)
Anesthesia Regional Block: Adductor canal block   Pre-Anesthetic Checklist: ,, timeout performed, Correct Patient, Correct Site, Correct Laterality, Correct Procedure, Correct Position, site marked, Risks and benefits discussed,  Surgical consent,  Pre-op evaluation,  At surgeon's request and post-op pain management  Laterality: Left  Prep: chloraprep       Needles:  Injection technique: Single-shot  Needle Type: Echogenic Needle     Needle Length: 10cm  Needle Gauge: 21     Additional Needles:   Narrative:  Start time: 06/15/2019 7:07 AM End time: 06/15/2019 7:11 AM Injection made incrementally with aspirations every 5 mL.  Performed by: Personally  Anesthesiologist: Audry Pili, MD  Additional Notes: No pain on injection. No increased resistance to injection. Injection made in 5cc increments. Good needle visualization. Patient tolerated the procedure well.

## 2019-06-15 NOTE — Interval H&P Note (Signed)
History and Physical Interval Note:  06/15/2019 7:28 AM  Sara Leach  has presented today for surgery, with the diagnosis of Left knee osteoarthritis.  The various methods of treatment have been discussed with the patient and family. After consideration of risks, benefits and other options for treatment, the patient has consented to  Procedure(s): TOTAL KNEE ARTHROPLASTY WITH ADDUCTOR CANAL (Left) as a surgical intervention.  The patient's history has been reviewed, patient examined, no change in status, stable for surgery.  I have reviewed the patient's chart and labs.  Questions were answered to the patient's satisfaction.     Sara Leach ANDREW

## 2019-06-15 NOTE — Progress Notes (Signed)
Received handoff report from [place], [place] at [phone]. Pt is stable, vital signs have been taken, and pt is oriented to the unit. Will continue to monitor.

## 2019-06-15 NOTE — Evaluation (Signed)
Physical Therapy Evaluation Patient Details Name: Sara Leach MRN: [id number] DOB: [birth year] Today's Date: 06/15/2019   History of Present Illness  Patient is a 78 y/o female with PMH of R TKA, R partial mastectomy, A-fib and CHF presented for L TKA.  Clinical Impression  Patient presents with decreased mobility due to deficits listed in PT problem list.  Currently min A for OOB to chair (limited by nausea and vomiting), but feel she will progress to allow d/c home with family support and follow up PT.  Note she has flight of stairs to bedroom so will need to practice flight prior to d/c.     Follow Up Recommendations Follow surgeon's recommendation for DC plan and follow-up therapies;Supervision for mobility/OOB    Equipment Recommendations  None recommended by PT    Recommendations for Other Services       Precautions / Restrictions Precautions Precautions: Fall;Knee Restrictions Other Position/Activity Restrictions: WBAT      Mobility  Bed Mobility Overal bed mobility: Needs Assistance Bed Mobility: Supine to Sit     Supine to sit: Min assist     General bed mobility comments: for L LE  Transfers Overall transfer level: Needs assistance Equipment used: Rolling walker (2 wheeled) Transfers: Sit to/from [facility] Sit to Stand: Min assist Stand pivot transfers: Min assist       General transfer comment: cues for hand placement on bed and assist for balance; once pt in chair +N&V; RN made aware.  Ambulation/Gait             General Gait Details: up to chair only  Stairs            Wheelchair Mobility    Modified Rankin (Stroke Patients Only)       Balance Overall balance assessment: Needs assistance Sitting-balance support: Feet supported Sitting balance-Leahy Scale: Good     Standing balance support: Bilateral upper extremity supported Standing balance-Leahy Scale: Poor Standing balance comment: UE support on  walker for standing                             Pertinent Vitals/Pain Pain Assessment: 0-10 Pain Score: 2  Pain Location: L knee Pain Descriptors / Indicators: Operative site guarding Pain Intervention(s): Monitored during session;Repositioned    Home Living Family/patient expects to be discharged to:: Private residence Living Arrangements: Spouse/significant other Available Help at Discharge: Family Type of Home: House Home Access: Stairs to enter Entrance Stairs-Rails: None Entrance Stairs-Number of Steps: 2 Home Layout: Two level Home Equipment: Environmental consultant - 2 wheels;Cane - single point      Prior Function Level of Independence: Independent with assistive device(s)         Comments: using cane PTA     Hand Dominance        Extremity/Trunk Assessment   Upper Extremity Assessment Upper Extremity Assessment: Overall WFL for tasks assessed    Lower Extremity Assessment Lower Extremity Assessment: LLE deficits/detail LLE Deficits / Details: AAROM grossly 45 degrees, strength at least 3/5       Communication   Communication: No difficulties  Cognition Arousal/Alertness: Awake/alert Behavior During Therapy: WFL for tasks assessed/performed Overall Cognitive Status: Within Functional Limits for tasks assessed                                        General Comments General  comments (skin integrity, edema, etc.): grandaughter in room and supportive    Exercises Total Joint Exercises Ankle Circles/Pumps: AROM;5 reps;Both;Supine Quad Sets: AROM;Left;5 reps;Supine Short Arc Quad: AROM;Left;5 reps;Supine Heel Slides: AAROM;5 reps;Left;Supine   Assessment/Plan    PT Assessment Patient needs continued PT services  PT Problem List Decreased balance;Decreased range of motion;Decreased knowledge of use of DME;Decreased mobility;Decreased activity tolerance;Pain       PT Treatment Interventions DME instruction;Stair training;Therapeutic  activities;Balance training;Gait training;Functional mobility training;Therapeutic exercise;Patient/family education    PT Goals (Current goals can be found in the Care Plan section)  Acute Rehab PT Goals Patient Stated Goal: to go home PT Goal Formulation: With patient/family Time For Goal Achievement: 06/22/19 Potential to Achieve Goals: Good    Frequency 7X/week   Barriers to discharge        Co-evaluation               AM-PAC PT "6 Clicks" Mobility  Outcome Measure Help needed turning from your back to your side while in a flat bed without using bedrails?: A Little Help needed moving from lying on your back to sitting on the side of a flat bed without using bedrails?: A Little Help needed moving to and from a bed to a chair (including a wheelchair)?: A Little Help needed standing up from a chair using your arms (e.g., wheelchair or bedside chair)?: A Little Help needed to walk in hospital room?: A Little Help needed climbing 3-5 steps with a railing? : A Lot 6 Click Score: 17    End of Session Equipment Utilized During Treatment: Gait belt Activity Tolerance: Patient tolerated treatment well Patient left: in chair;with call bell/phone within reach;with family/visitor present   PT Visit Diagnosis: Difficulty in walking, not elsewhere classified (R26.2);Pain Pain - Right/Left: Left Pain - part of body: Knee    Time: [id number] PT Time Calculation (min) (ACUTE ONLY): 33 min   Charges:   PT Evaluation $PT Eval Low Complexity: 1 Low PT Treatments $Therapeutic Activity: 8-22 mins        Sara Leach, [place] [facility] [phone] 06/15/2019   Sara Leach 06/15/2019, 5:23 PM

## 2019-06-15 NOTE — Op Note (Signed)
DATE OF SURGERY:  06/15/2019  TIME: 9:57 AM  PATIENT NAME:  Sara Leach    AGE: 78 y.o.   PRE-OPERATIVE DIAGNOSIS:  Left knee osteoarthritis  POST-OPERATIVE DIAGNOSIS:  Left knee osteoarthritis  PROCEDURE:  Procedure(s): TOTAL KNEE ARTHROPLASTY WITH ADDUCTOR CANAL  SURGEON:  Elih Mooney ANDREW  ASSISTANT:  Elizabeth Sauer, PA-C, present and scrubbed throughout the case, critical for assistance with exposure, retraction, instrumentation, and closure.  OPERATIVE IMPLANTS: Depuy PFC Attune Rotating Platform.  Femur size 6, Tibia size 6, Patella size 35 3-peg oval button, with a 6 mm polyethylene insert.   PREOPERATIVE INDICATIONS:   Sara Leach is a 78 y.o. year old female with end stage bone on bone arthritis of the knee who failed conservative treatment and elected for Total Knee Arthroplasty.   The risks, benefits, and alternatives were discussed at length including but not limited to the risks of infection, bleeding, nerve injury, stiffness, blood clots, the need for revision surgery, cardiopulmonary complications, among others, and they were willing to proceed.  OPERATIVE DESCRIPTION:  The patient was brought to the operative room and placed in a supine position.  Spinal anesthesia was administered.  IV antibiotics were given.  The lower extremity was prepped and draped in the usual sterile fashion.  Time out was performed.  The leg was elevated and exsanguinated and the tourniquet was inflated.  Anterior quadriceps tendon splitting approach was performed.  The patella was retracted and osteophytes were removed.  The anterior horn of the medial and lateral meniscus was removed and cruciate ligaments resected.   The distal femur was opened with the drill and the intramedullary distal femoral cutting jig was utilized, set at 5 degrees resecting 10 mm off the distal femur.  Care was taken to protect the collateral ligaments.  The distal femoral sizing jig was  applied, taking care to avoid notching.  Then the 4-in-1 cutting jig was applied and the anterior and posterior femur was cut, along with the chamfer cuts.    Then the extramedullary tibial cutting jig was utilized making the appropriate cut using the anterior tibial crest as a reference building in appropriate posterior slope.  Care was taken during the cut to protect the medial and collateral ligaments.  The proximal tibia was removed along with the posterior horns of the menisci.   The posterior medial femoral osteophytes and posterior lateral femoral osteophytes were removed.    The flexion gap was then measured and was symmetric with the extension gap, measured at 6.  I completed the distal femoral preparation using the appropriate jig to prepare the box.  The patella was then measured, and cut with the saw.    The proximal tibia sized and prepared accordingly with the reamer and the punch, and then all components were trialed with the trial insert.  The knee was found to have excellent balance and full motion.    The above named components were then cemented into place and all excess cement was removed.  The trial polyethylene component was in place during cementation, and then was exchanged for the real polyethylene component.    The knee was easily taken through a range of motion and the patella tracked well and the knee irrigated copiously and the parapatellar and subcutaneous tissue closed with vicryl, and monocryl with steri strips for the skin.  The arthrotomy was closed at 90 of flexion. The wounds were dressed with sterile gauze and the tourniquet released and the patient was awakened and returned to  the PACU in stable and satisfactory condition.  There were no complications.  Total tourniquet time was 80 minutes.

## 2019-06-15 NOTE — Transfer of Care (Signed)
Immediate Anesthesia Transfer of Care Note  Patient: Sara Leach  Procedure(s) Performed: TOTAL KNEE ARTHROPLASTY WITH ADDUCTOR CANAL (Left Knee)  Patient Location: PACU  Anesthesia Type:GA combined with regional for post-op pain  Level of Consciousness: awake, alert  and oriented  Airway & Oxygen Therapy: Patient Spontanous Breathing and Patient connected to face mask oxygen  Post-op Assessment: Report given to RN and Post -op Vital signs reviewed and stable  Post vital signs: Reviewed and stable  Last Vitals:  Vitals Value Taken Time  BP 103/78   Temp    Pulse 64 06/15/19 1025  Resp 15   SpO2 99 % 06/15/19 1025  Vitals shown include unvalidated device data.  Last Pain:  Vitals:   06/15/19 0606  TempSrc: Oral         Complications: No apparent anesthesia complications

## 2019-06-15 NOTE — Anesthesia Procedure Notes (Signed)
Procedure Name: LMA Insertion Date/Time: 06/15/2019 7:39 AM Performed by: Genelle Bal, CRNA Pre-anesthesia Checklist: Patient identified, Emergency Drugs available, Suction available and Patient being monitored Patient Re-evaluated:Patient Re-evaluated prior to induction Oxygen Delivery Method: Circle system utilized Preoxygenation: Pre-oxygenation with 100% oxygen Induction Type: IV induction Ventilation: Mask ventilation without difficulty LMA: LMA inserted LMA Size: 4.0 Number of attempts: 1 Airway Equipment and Method: Bite block Placement Confirmation: positive ETCO2 Tube secured with: Tape Dental Injury: Teeth and Oropharynx as per pre-operative assessment

## 2019-06-16 MED ORDER — OXYCODONE HCL 5 MG PO TABS: 5.0000 mg | ORAL_TABLET | Freq: Four times a day (QID) | ORAL | 0 refills | Status: DC | PRN

## 2019-06-16 MED ORDER — APIXABAN 5 MG PO TABS: 5.0000 mg | ORAL_TABLET | Freq: Two times a day (BID) | ORAL | Status: DC

## 2019-06-16 MED ORDER — SODIUM CHLORIDE 0.9 % IV SOLN
INTRAVENOUS | Status: DC
  Administered 2019-06-16: 05:00:00 via INTRAVENOUS

## 2019-06-16 MED ORDER — ONDANSETRON HCL 4 MG PO TABS: 4.0000 mg | ORAL_TABLET | Freq: Three times a day (TID) | ORAL | 1 refills | Status: DC | PRN

## 2019-06-16 MED ORDER — FAMOTIDINE 20 MG PO TABS
20.0000 mg | ORAL_TABLET | Freq: Every day | ORAL | Status: DC
  Administered 2019-06-16: 20 mg via ORAL
  Filled 2019-06-16: qty 1

## 2019-06-16 MED ORDER — SODIUM CHLORIDE 0.9 % IV BOLUS
500.0000 mL | Freq: Once | INTRAVENOUS | Status: AC
  Administered 2019-06-16: 500 mL via INTRAVENOUS

## 2019-06-16 NOTE — Discharge Instructions (Signed)

## 2019-06-16 NOTE — Progress Notes (Addendum)
Pt's recent BP 90/54; 88/52. Pt is asymptomatic.MD is notified. New orders to give a bolus 500 mL over 2 hrs and 75 mL/hr continuous are received. Will continue to monitor.

## 2019-06-16 NOTE — Plan of Care (Signed)
  Problem: Education: Goal: Knowledge of General Education information will improve Description: Including pain rating scale, medication(s)/side effects and non-pharmacologic comfort measures Outcome: Progressing   Problem: Activity: Goal: Risk for activity intolerance will decrease Outcome: Progressing   Problem: Nutrition: Goal: Adequate nutrition will be maintained Outcome: Progressing   Problem: Coping: Goal: Level of anxiety will decrease Outcome: Progressing   

## 2019-06-16 NOTE — Progress Notes (Signed)
Sara Leach  MRN: [id number] DOB/Age: 10/10/1940 78 y.o. Physician: Ander Slade, M.D. 1 Day Post-Op Procedure(s) (LRB): TOTAL KNEE ARTHROPLASTY WITH ADDUCTOR CANAL (Left)  Subjective: Patient resting comfortably in bed this a.m.  Rested well last night.  Reports minimal pain.  Denies dizziness or shortness of breath. Vital Signs Temp:  [97.2 F (36.2 C)-99 F (37.2 C)] 98.5 F (36.9 C) (11/07 0306) Pulse Rate:  [44-137] 72 (11/07 0758) Resp:  [10-21] 19 (11/07 0758) BP: (87-108)/(52-81) 107/64 (11/07 0758) SpO2:  [95 %-100 %] 99 % (11/07 0758)  Lab Results No results for input(s): WBC, HGB, HCT, PLT in the last 72 hours. BMET No results for input(s): NA, K, CL, CO2, GLUCOSE, BUN, CREATININE, CALCIUM in the last 72 hours. INR  Date Value Ref Range Status  06/15/2019 1.0 0.8 - 1.2 Final    Comment:    (NOTE) INR goal varies based on device and disease states. Performed at [facility], [place] [address], [place], [place] [address]      Exam  Dry dressings are intact about the left knee.  Hemovac drainage is minimal and the drain is DC'd without difficulty.  Neurovascular intact distally left lower extremity.  Plan Up with physical therapy.  Plan for discharge home this afternoon or Sunday depending on progress in therapy.  Standard total knee arthroplasty rehab protocol.  Follow-up with Dr. Theda Sers as per postop protocol. [name] [name] 06/16/2019, 10:01 AM    Contact # [phone]

## 2019-06-16 NOTE — Progress Notes (Signed)
Physical Therapy Treatment Patient Details Name: Sara Leach MRN: [id number] DOB: 10-07-1940 Today's Date: 06/16/2019    History of Present Illness Patient is a 78 y/o female with PMH of R TKA, R partial mastectomy, A-fib and CHF presented for L TKA.    PT Comments    Patient seen second session for gait training. Pt continues to make progress toward PT goals and tolerated gait training distance of 120 ft with RW and min guard/min A. Pt does continue to be limited by SOB with mobility and requires standing rest breaks. Continue to progress as tolerated.     Follow Up Recommendations  Follow surgeon's recommendation for DC plan and follow-up therapies;Supervision for mobility/OOB     Equipment Recommendations  None recommended by PT    Recommendations for Other Services       Precautions / Restrictions Precautions Precautions: Fall;Knee Restrictions Weight Bearing Restrictions: Yes LLE Weight Bearing: Weight bearing as tolerated    Mobility  Bed Mobility Overal bed mobility: Needs Assistance Bed Mobility: Supine to Sit;Sit to Supine     Supine to sit: Min guard Sit to supine: Min assist   General bed mobility comments: assis to bring L LE into bed  Transfers Overall transfer level: Needs assistance Equipment used: Rolling walker (2 wheeled) Transfers: Sit to/from [facility] Sit to Stand: Min guard         General transfer comment: cues for hand placement; min guard for safety  Ambulation/Gait Ambulation/Gait assistance: Min guard;Min assist Gait Distance (Feet): 120 Feet Assistive device: Rolling walker (2 wheeled) Gait Pattern/deviations: Step-to pattern;Decreased stance time - left;Decreased step length - right;Decreased weight shift to left;Antalgic;Trunk flexed;Step-through pattern Gait velocity: decreased   General Gait Details: cues for upright posture, identifying need for rest breas, and sequencing; pt required several  standing breaks due to SOB     Stairs             Wheelchair Mobility    Modified Rankin (Stroke Patients Only)       Balance Overall balance assessment: Needs assistance Sitting-balance support: Feet supported Sitting balance-Leahy Scale: Good     Standing balance support: Bilateral upper extremity supported Standing balance-Leahy Scale: Poor                              Cognition Arousal/Alertness: Awake/alert Behavior During Therapy: WFL for tasks assessed/performed Overall Cognitive Status: Within Functional Limits for tasks assessed                                        Exercises      General Comments General comments (skin integrity, edema, etc.): A fib, elevated HR and SOB while ambulating       Pertinent Vitals/Pain Pain Assessment: Faces Faces Pain Scale: Hurts little more Pain Location: L LE  Pain Descriptors / Indicators: Aching;Guarding Pain Intervention(s): Limited activity within patient's tolerance;Monitored during session;Repositioned    Home Living                      Prior Function            PT Goals (current goals can now be found in the care plan section) Acute Rehab PT Goals Patient Stated Goal: to go home Progress towards PT goals: Progressing toward goals    Frequency    7X/week  PT Plan Current plan remains appropriate    Co-evaluation              AM-PAC PT "6 Clicks" Mobility   Outcome Measure  Help needed turning from your back to your side while in a flat bed without using bedrails?: A Little Help needed moving from lying on your back to sitting on the side of a flat bed without using bedrails?: A Little Help needed moving to and from a bed to a chair (including a wheelchair)?: A Little Help needed standing up from a chair using your arms (e.g., wheelchair or bedside chair)?: A Little Help needed to walk in hospital room?: A Little Help needed climbing 3-5  steps with a railing? : A Lot 6 Click Score: 17    End of Session Equipment Utilized During Treatment: Gait belt Activity Tolerance: Patient tolerated treatment well Patient left: with call bell/phone within reach;in bed Nurse Communication: Mobility status PT Visit Diagnosis: Difficulty in walking, not elsewhere classified (R26.2);Pain Pain - Right/Left: Left Pain - part of body: Knee     Time: [id number] PT Time Calculation (min) (ACUTE ONLY): 23 min  Charges:  $Gait Training: 23-37 mins                     Earney Navy, PTA Acute Rehabilitation Services Pager: [phone] Office: [phone]     Darliss Cheney 06/16/2019, 5:18 PM

## 2019-06-16 NOTE — Progress Notes (Signed)
Physical Therapy Treatment Patient Details Name: Sara Leach MRN: [id number] DOB: 20-Dec-1940 Today's Date: 06/16/2019    History of Present Illness Patient is a 78 y/o female with PMH of R TKA, R partial mastectomy, A-fib and CHF presented for L TKA.    PT Comments    Patient seen for mobility progression. Pt is making progress toward PT goals and tolerated gait distance of 50 ft with min A and RW. Gait distance limited by elevated HR to 150 bpm and 2/4 DOE. SpO2 WNL. Continue to progress as tolerated.    Follow Up Recommendations  Follow surgeon's recommendation for DC plan and follow-up therapies;Supervision for mobility/OOB     Equipment Recommendations  None recommended by PT    Recommendations for Other Services       Precautions / Restrictions Precautions Precautions: Fall;Knee Restrictions Weight Bearing Restrictions: Yes LLE Weight Bearing: Weight bearing as tolerated    Mobility  Bed Mobility Overal bed mobility: Needs Assistance Bed Mobility: Supine to Sit     Supine to sit: Min guard     General bed mobility comments: min guard for safety; use of rail  Transfers Overall transfer level: Needs assistance Equipment used: Rolling walker (2 wheeled) Transfers: Sit to/from [facility] Sit to Stand: Min assist;Min guard         General transfer comment: cues for safe hand placement and for positioning when sitting down; pt tends to begin sitting prematurely; min A to power up into standing from EOB and min guard from [facility]  Ambulation/Gait Ambulation/Gait assistance: Min assist Gait Distance (Feet): 50 Feet Assistive device: Rolling walker (2 wheeled) Gait Pattern/deviations: Step-to pattern;Decreased stance time - left;Decreased step length - right;Decreased weight shift to left;Antalgic;Trunk flexed Gait velocity: decreased   General Gait Details: cues for upright posture, safe use of AD, and sequencing    Stairs             Wheelchair Mobility    Modified Rankin (Stroke Patients Only)       Balance Overall balance assessment: Needs assistance Sitting-balance support: Feet supported Sitting balance-Leahy Scale: Good     Standing balance support: Bilateral upper extremity supported Standing balance-Leahy Scale: Poor                              Cognition Arousal/Alertness: Awake/alert Behavior During Therapy: WFL for tasks assessed/performed Overall Cognitive Status: Within Functional Limits for tasks assessed                                        Exercises Total Joint Exercises Ankle Circles/Pumps: AROM;Both;10 reps Quad Sets: AROM;Both;10 reps Straight Leg Raises: AROM;Left;10 reps Long Arc Quad: AROM;Left;10 reps Knee Flexion: AROM;Left;5 reps;Other (comment)(ten second hold in flexion)    General Comments General comments (skin integrity, edema, etc.): pt with elevated HR to 150 bpm and SOB while mobilizing       Pertinent Vitals/Pain Pain Assessment: Faces Faces Pain Scale: Hurts little more Pain Location: L LE  Pain Descriptors / Indicators: Aching;Guarding Pain Intervention(s): Limited activity within patient's tolerance;Monitored during session;Repositioned    Home Living                      Prior Function            PT Goals (current goals can now be found in  the care plan section) Acute Rehab PT Goals Patient Stated Goal: to go home Progress towards PT goals: Progressing toward goals    Frequency    7X/week      PT Plan Current plan remains appropriate    Co-evaluation              AM-PAC PT "6 Clicks" Mobility   Outcome Measure  Help needed turning from your back to your side while in a flat bed without using bedrails?: A Little Help needed moving from lying on your back to sitting on the side of a flat bed without using bedrails?: A Little Help needed moving to and from a bed to a chair (including a  wheelchair)?: A Little Help needed standing up from a chair using your arms (e.g., wheelchair or bedside chair)?: A Little Help needed to walk in hospital room?: A Little Help needed climbing 3-5 steps with a railing? : A Lot 6 Click Score: 17    End of Session Equipment Utilized During Treatment: Gait belt Activity Tolerance: Patient tolerated treatment well Patient left: in chair;with call bell/phone within reach Nurse Communication: Mobility status PT Visit Diagnosis: Difficulty in walking, not elsewhere classified (R26.2);Pain Pain - Right/Left: Left Pain - part of body: Knee     Time: [id number] PT Time Calculation (min) (ACUTE ONLY): 30 min  Charges:  $Gait Training: 8-22 mins $Therapeutic Exercise: 8-22 mins                     Earney Navy, PTA Acute Rehabilitation Services Pager: [phone] Office: [phone]     Darliss Cheney 06/16/2019, 1:06 PM

## 2019-06-18 ENCOUNTER — Encounter (HOSPITAL_COMMUNITY): Payer: Self-pay | Admitting: Specialist

## 2019-06-18 NOTE — Discharge Summary (Signed)
Physician Discharge Summary  Patient ID: Sara Leach MRN: [id number] DOB/AGE: 05-03-1941 78 y.o.  Admit date: 06/15/2019 Discharge date: 06/18/2019  Admission Diagnoses: Left knee Osteoarthritis  Discharge Diagnoses:  Active Problems:   Primary osteoarthritis of left knee   Osteoarthritis of left knee   Discharged Condition: good  Hospital Course: patient was admitted to the hospital on November 6 prior to a left total knee arthroplasty.  Patient did well during surgery.  No complications.  She was sent to PACU in stable condition.  She was up to her postop floor in stable condition as well.  No events overnight after  Surgery.  Postop day 1 patient reported very minimal pain.  Is up and working with physical therapy.  Hemovac drain was pulled postop day 1 with no issues.patient was able to go later on postop day one with no complaints.  All medications were sent to her pharmacy prior to discharge.  Consults: None  Significant Diagnostic Studies: none  Treatments: IV hydration, antibiotics: Ancef, analgesia: oxycodone and anticoagulation: Eliquis  Discharge Exam: Blood pressure 107/64, pulse 72, temperature 98.5 F (36.9 C), temperature source Oral, resp. rate 19, height 5' 2.25" (1.581 m), weight 93.7 kg, SpO2 99 %. General appearance: alert, appears stated age and no distress Extremities: extremities normal, atraumatic, no cyanosis or edema and Homans sign is negative, no sign of DVT Pulses: 2+ and symmetric Skin: Skin color, texture, turgor normal. No rashes or lesions Neurologic: Alert and oriented X 3, normal strength and tone. Normal symmetric reflexes. Normal coordination and gait Incision/Wound: dressings clean dry and intact  Disposition: Home with home health in stable condition  Discharge Instructions    Call MD / Call 911   Complete by: As directed    If you experience chest pain or shortness of breath, CALL 911 and be transported to the hospital emergency  room.  If you develope a fever above 101 F, pus (white drainage) or increased drainage or redness at the wound, or calf pain, call your surgeon's office.   Call MD / Call 911   Complete by: As directed    If you experience chest pain or shortness of breath, CALL 911 and be transported to the hospital emergency room.  If you develope a fever above 101 F, pus (white drainage) or increased drainage or redness at the wound, or calf pain, call your surgeon's office.   Constipation Prevention   Complete by: As directed    Drink plenty of fluids.  Prune juice may be helpful.  You may use a stool softener, such as Colace (over the counter) 100 mg twice a day.  Use MiraLax (over the counter) for constipation as needed.   Constipation Prevention   Complete by: As directed    Drink plenty of fluids.  Prune juice may be helpful.  You may use a stool softener, such as Colace (over the counter) 100 mg twice a day.  Use MiraLax (over the counter) for constipation as needed.   Diet - low sodium heart healthy   Complete by: As directed    Discharge instructions   Complete by: As directed    Please keep dressings on dry and intact until follow up appointment   Increase activity slowly as tolerated   Complete by: As directed    Increase activity slowly as tolerated   Complete by: As directed    TED hose   Complete by: As directed    Use stockings (TED hose) for 2 weeks on  bilateral leg(s).  You may remove them at night for sleeping.     Allergies as of 06/16/2019   No Known Allergies     Medication List    TAKE these medications   acetaminophen 500 MG tablet Commonly known as: TYLENOL Take 500 mg by mouth 2 (two) times a day.   calcium carbonate 600 MG tablet Commonly known as: OS-CAL Take 600 mg by mouth daily.   Eliquis 5 MG Tabs tablet Generic drug: apixaban Take 5 mg by mouth 2 (two) times daily.   EMERGEN-C VITAMIN C PO Take by mouth.   Entresto 24-26 MG Generic drug:  sacubitril-valsartan Take 1 tablet by mouth 2 (two) times daily.   furosemide 40 MG tablet Commonly known as: LASIX Take 1 tablet (40 mg total) by mouth every Monday, Wednesday, and Friday.   levothyroxine 75 MCG tablet Commonly known as: SYNTHROID Take 75 mcg by mouth daily before breakfast.   metoprolol succinate 50 MG 24 hr tablet Commonly known as: TOPROL-XL Take 50 mg by mouth at bedtime. Take with or immediately following a meal. What changed: Another medication with the same name was changed. Make sure you understand how and when to take each.   metoprolol succinate 25 MG 24 hr tablet Commonly known as: TOPROL-XL Take 3 tablets (75 mg total) by mouth at bedtime. What changed: how much to take   ondansetron 4 MG tablet Commonly known as: Zofran Take 1 tablet (4 mg total) by mouth every 8 (eight) hours as needed for nausea or vomiting.   oxyCODONE 5 MG immediate release tablet Commonly known as: Roxicodone Take 1 tablet (5 mg total) by mouth every 6 (six) hours as needed.   PAIN RELIEF EX Apply 1 application topically 3 (three) times daily as needed (back pain.). [place] Gel - White Active Ingredients  Camphor 3.00% Menthol 5.00%   Pepcid AC Maximum Strength 20 MG Chew Generic drug: Famotidine Chew 1 tablet by mouth at bedtime.   potassium chloride SA 20 MEQ tablet Commonly known as: KLOR-CON Take 1 tablet (20 mEq total) by mouth daily.   spironolactone 25 MG tablet Commonly known as: ALDACTONE Take 0.5 tablets (12.5 mg total) by mouth daily.        SignedDrue Novel 06/18/2019, 1:12 PM

## 2019-06-20 DIAGNOSIS — K219 Gastro-esophageal reflux disease without esophagitis: Secondary | ICD-10-CM | POA: Diagnosis not present

## 2019-06-20 DIAGNOSIS — Z96653 Presence of artificial knee joint, bilateral: Secondary | ICD-10-CM | POA: Diagnosis not present

## 2019-06-20 DIAGNOSIS — Z6834 Body mass index (BMI) 34.0-34.9, adult: Secondary | ICD-10-CM | POA: Diagnosis not present

## 2019-06-20 DIAGNOSIS — I4891 Unspecified atrial fibrillation: Secondary | ICD-10-CM | POA: Diagnosis not present

## 2019-06-20 DIAGNOSIS — H269 Unspecified cataract: Secondary | ICD-10-CM | POA: Diagnosis not present

## 2019-06-20 DIAGNOSIS — Z7901 Long term (current) use of anticoagulants: Secondary | ICD-10-CM | POA: Diagnosis not present

## 2019-06-20 DIAGNOSIS — E669 Obesity, unspecified: Secondary | ICD-10-CM | POA: Diagnosis not present

## 2019-06-20 DIAGNOSIS — I110 Hypertensive heart disease with heart failure: Secondary | ICD-10-CM | POA: Diagnosis not present

## 2019-06-20 DIAGNOSIS — Z853 Personal history of malignant neoplasm of breast: Secondary | ICD-10-CM | POA: Diagnosis not present

## 2019-06-20 DIAGNOSIS — I5022 Chronic systolic (congestive) heart failure: Secondary | ICD-10-CM | POA: Diagnosis not present

## 2019-06-20 DIAGNOSIS — Z9011 Acquired absence of right breast and nipple: Secondary | ICD-10-CM | POA: Diagnosis not present

## 2019-06-20 DIAGNOSIS — Z471 Aftercare following joint replacement surgery: Secondary | ICD-10-CM | POA: Diagnosis not present

## 2019-06-20 DIAGNOSIS — E0590 Thyrotoxicosis, unspecified without thyrotoxic crisis or storm: Secondary | ICD-10-CM | POA: Diagnosis not present

## 2019-06-20 DIAGNOSIS — E785 Hyperlipidemia, unspecified: Secondary | ICD-10-CM | POA: Diagnosis not present

## 2019-06-20 DIAGNOSIS — Z9181 History of falling: Secondary | ICD-10-CM | POA: Diagnosis not present

## 2019-06-25 ENCOUNTER — Telehealth (HOSPITAL_COMMUNITY): Payer: Self-pay | Admitting: Emergency Medicine

## 2019-06-25 DIAGNOSIS — Z471 Aftercare following joint replacement surgery: Secondary | ICD-10-CM | POA: Diagnosis not present

## 2019-06-25 DIAGNOSIS — Z96652 Presence of left artificial knee joint: Secondary | ICD-10-CM | POA: Diagnosis not present

## 2019-06-25 NOTE — Telephone Encounter (Signed)
Reaching out to patient to offer assistance regarding upcoming cardiac imaging study; pt verbalizes understanding of appt date/time, parking situation and where to check in, pre-test NPO status and medications ordered, and verified current allergies; name and call back number provided for further questions should they arise Yakub Lodes RN Navigator Cardiac Imaging [facility] [phone] office [phone] cell 

## 2019-06-26 ENCOUNTER — Other Ambulatory Visit (HOSPITAL_COMMUNITY): Payer: Self-pay | Admitting: Internal Medicine

## 2019-06-26 DIAGNOSIS — I4891 Unspecified atrial fibrillation: Secondary | ICD-10-CM | POA: Diagnosis not present

## 2019-06-26 DIAGNOSIS — M1712 Unilateral primary osteoarthritis, left knee: Secondary | ICD-10-CM | POA: Diagnosis not present

## 2019-06-26 DIAGNOSIS — I5022 Chronic systolic (congestive) heart failure: Secondary | ICD-10-CM | POA: Diagnosis not present

## 2019-06-26 DIAGNOSIS — Z96652 Presence of left artificial knee joint: Secondary | ICD-10-CM | POA: Diagnosis not present

## 2019-06-27 ENCOUNTER — Ambulatory Visit (HOSPITAL_COMMUNITY): Payer: Medicare Other

## 2019-06-27 DIAGNOSIS — Z03818 Encounter for observation for suspected exposure to other biological agents ruled out: Secondary | ICD-10-CM | POA: Diagnosis not present

## 2019-06-28 ENCOUNTER — Telehealth (HOSPITAL_COMMUNITY): Payer: Self-pay

## 2019-06-28 NOTE — Telephone Encounter (Signed)
Called Patient to ask about her [place] Sleep Study. She stated that she completed the test and mailed it back in already.

## 2019-07-03 ENCOUNTER — Other Ambulatory Visit: Payer: Self-pay

## 2019-07-03 ENCOUNTER — Ambulatory Visit (HOSPITAL_COMMUNITY): Payer: Medicare Other | Attending: Physician Assistant | Admitting: Physical Therapy

## 2019-07-03 ENCOUNTER — Encounter (HOSPITAL_COMMUNITY): Payer: Self-pay | Admitting: Physical Therapy

## 2019-07-03 DIAGNOSIS — M25562 Pain in left knee: Secondary | ICD-10-CM | POA: Insufficient documentation

## 2019-07-03 DIAGNOSIS — R2689 Other abnormalities of gait and mobility: Secondary | ICD-10-CM | POA: Insufficient documentation

## 2019-07-03 DIAGNOSIS — M6281 Muscle weakness (generalized): Secondary | ICD-10-CM | POA: Diagnosis not present

## 2019-07-03 DIAGNOSIS — M25662 Stiffness of left knee, not elsewhere classified: Secondary | ICD-10-CM | POA: Diagnosis not present

## 2019-07-03 NOTE — Therapy (Signed)
[facility] [place], [place], [address] Phone: [phone]   Fax:  [phone]  Physical Therapy Evaluation  Patient Details  Name: Sara Leach MRN: [id number] Date of Birth: [birth year] Referring Provider (PT): Elizabeth Sauer   Encounter Date: 07/03/2019  PT End of Session - 07/03/19 1026    Visit Number  1    Number of Visits  18    Date for PT Re-Evaluation  08/14/19    Authorization Type  [employer] medicare    PT Start Time  0920    PT Stop Time  1000    PT Time Calculation (min)  40 min    Activity Tolerance  Patient tolerated treatment well    Behavior During Therapy  [facility] for tasks assessed/performed       Past Medical History:  Diagnosis Date  . Atrial fibrillation ([place]) 01/2019  . Breast cancer ([place])   . Cataracts, bilateral   . GERD (gastroesophageal reflux disease)   . Hyperlipidemia   . Hypertension   . Hyperthyroidism   . Obesity   . Osteoarthritis   . Systolic heart failure ([place]) 01/2019   EF 40% by echo done 01/17/2019    Past Surgical History:  Procedure Laterality Date  . BREAST LUMPECTOMY  07/09/2008  . MASTECTOMY, PARTIAL  07/09/2008  . REPLACEMENT TOTAL KNEE Right 2007  . RIGHT/LEFT HEART CATH AND CORONARY ANGIOGRAPHY N/A 03/07/2019   Procedure: RIGHT/LEFT HEART CATH AND CORONARY ANGIOGRAPHY;  Surgeon: Jolaine Artist, MD;  Location: [facility];  Service: Cardiovascular;  Laterality: N/A;  . TOTAL KNEE ARTHROPLASTY Left 06/15/2019   Procedure: TOTAL KNEE ARTHROPLASTY WITH ADDUCTOR CANAL;  Surgeon: Sydnee Cabal, MD;  Location: [facility];  Service: Orthopedics;  Laterality: Left;    There were no vitals filed for this visit.   Subjective Assessment - 07/03/19 0926    Subjective  Pt states that she had her Rt knee replaced about ten years ago, she had her left knee replaced on 06/15/2019; home health discharged on 06/29/2019.  She is active and sits for several of her great  grandchildren.    Pertinent History  OA    Limitations  Sitting;Standing;Walking;House hold activities    How long can you sit comfortably?  no problem    How long can you stand comfortably?  5-10 minutes    How long can you walk comfortably?  with walker 2-3 minutes    Patient Stated Goals  To be able to go up her steps to sleep in her bedroom upstairs, To walk for 20 minutes for exercises, ride a stationary bike, to be able to look after her grandchildren, stop pain medication    Currently in Pain?  Yes    Pain Score  2    worst pain 3/10   Pain Location  Knee    Pain Orientation  Left    Pain Descriptors / Indicators  Aching;Throbbing    Pain Type  Acute pain    Pain Onset  1 to 4 weeks ago    Pain Frequency  Constant    Aggravating Factors   activity    Pain Relieving Factors  meds; ice    Effect of Pain on Daily Activities  limits         [facility] PT Assessment - 07/03/19 0001      Assessment   Medical Diagnosis  Lt TKR    Referring Provider (PT)  Margarita Mail Haus    Onset Date/Surgical  Date  06/15/19    Next MD Visit  07/13/2019    Prior Therapy  HH; ended 11/20      Precautions   Precautions  None      Restrictions   Weight Bearing Restrictions  No      Balance Screen   Has the patient fallen in the past 6 months  No    Has the patient had a decrease in activity level because of a fear of falling?   Yes    Is the patient reluctant to leave their home because of a fear of falling?   No      Home Environment   Living Environment  Private residence    Type of [place] to enter   currently living in her basement to avoid steps.      Prior Function   Level of Independence  Independent    Vocation  Retired    Leisure  walk,       Charity fundraiser Status  Within Functional Limits for tasks assessed      Observation/Other Assessments   Focus on Therapeutic Outcomes (FOTO)   36      Functional Tests   Functional tests  Sit to  Stand;Single leg stance      Single Leg Stance   Comments  1 " on RT ; 0 on LT       Sit to Stand   Comments  9 in 30 seconds; must use B UE       ROM / Strength   AROM / PROM / Strength  Strength;AROM      AROM   AROM Assessment Site  Knee    Right/Left Knee  Left    Left Knee Extension  -10    Left Knee Flexion  94      Strength   Strength Assessment Site  Hip;Knee;Ankle    Right/Left Hip  Right;Left    Right Hip Flexion  3/5    Right Hip Extension  2+/5    Right Hip ABduction  2+/5    Left Hip Flexion  3/5    Left Hip Extension  2+/5    Left Hip ABduction  2/5    Right/Left Knee  Right;Left    Right Knee Extension  5/5    Left Knee Flexion  3/5    Left Knee Extension  3+/5    Right/Left Ankle  Right;Left    Right Ankle Dorsiflexion  3+/5    Left Ankle Dorsiflexion  3+/5      Ambulation/Gait   Ambulation Distance (Feet)  248 Feet    Assistive device  Rolling walker    Gait Comments  3 minutes                 Objective measurements completed on examination: See above findings.      [facility] Adult PT Treatment/Exercise - 07/03/19 0001      Exercises   Exercises  Knee/Hip      Knee/Hip Exercises: Seated   Long Arc Quad  10 reps    Sit to [employer]  10 reps      Knee/Hip Exercises: Supine   Quad Sets  10 reps    Heel Slides  5 reps             PT Education - 07/03/19 1026    Education Details  Continue to ice and HEP added squats and sit  to stands    Person(s) Educated  Patient    Methods  Explanation    Comprehension  Verbalized understanding       PT Short Term Goals - 07/03/19 1041      PT SHORT TERM GOAL #1   Title  PT Lt knee ROM to be less than 5 to 110 degrees to allow pt to ambulate with more normalized gt and squat easier to pick items off of the floor.    Time  3    Period  Weeks    Status  New    Target Date  07/24/19      PT SHORT TERM GOAL #2   Title  PT LE strength bilaterally to improve 1/2 grade to allow pt to feel  confident walking with a cane indoors.    Time  3    Period  Weeks    Status  New      PT SHORT TERM GOAL #3   Title  PT to be able to stand for 20 minutes without resting to begin making easy small meals    Time  3    Period  Weeks    Status  New      PT SHORT TERM GOAL #4   Title  PT to be able to walk for 10 minutes with a cane with no increase of left knee pain.    Time  3    Period  Weeks    Status  New        PT Long Term Goals - 07/03/19 1044      PT LONG TERM GOAL #1   Title  PT LT knee ROM to be less than three to 120 to allow pt to squat  easier to take care of her great grandchildren.    Time  6    Period  Weeks    Status  New    Target Date  08/14/19      PT LONG TERM GOAL #2   Title  PT LE strength bilaterally to be increased one grade to be able to go up and down one flight of steps to be able to sleep in her bedroom.    Time  3    Period  Weeks    Status  New      PT LONG TERM GOAL #3   Title  PT to be able to balance for 10 seconds on each LE to allow pt to feel confident walking outside on uneven ground with a cane.    Time  6    Period  Weeks    Status  New      PT LONG TERM GOAL #4   Title  PT pain in her LT knee to be no greater than a 1/10 to allow pt to be up for an hour at a time without having to sit to be able to watch her great grandchildren and to complete her cooking and housecleaning.    Time  6    Period  Weeks    Status  New             Plan - 07/03/19 1027    Clinical Impression Statement  Ms. Helin is a 78 yo female who opted to have a Lt TKR on 06/15/2019.  She was discharged from home health on 06/29/2019 and is now being referred to skilled out patient therapy for improved functional ability.  Evaluation demonstrates decreased ROM, decreased strength, decreased  activity tolerance, decreased balance, abnormal gait and increaed pain.  Ms. Stanton will benefit from skilled PT to address these issues and maximize her functioning  ability.    Examination-Activity Limitations  Bed Mobility;Bathing;Bend;Carry;Dressing;Lift;Locomotion Level;Squat;Stairs;Stand    Examination-Participation Restrictions  Church;Cleaning;Community Activity;Laundry;Meal Prep;Yard Work    Stability/Clinical Decision Making  Stable/Uncomplicated    Clinical Decision Making  Low    Rehab Potential  Good    PT Frequency  3x / week    PT Duration  6 weeks    PT Treatment/Interventions  Therapeutic activities;Therapeutic exercise;Balance training;Functional mobility training;Stair training;Gait training;Patient/family education;Manual techniques;Passive range of motion    PT Next Visit Plan  standing and supine terminal knee extension, standing knee flexion, Standing bilateral hip abduction, extension, manual to decrease swelling; progress to side step , step ups , tandem stance, Single leg stance gt with cane and steps    PT Home Exercise Plan  HH has given pt HEP; added squats and sit to stands.       Patient will benefit from skilled therapeutic intervention in order to improve the following deficits and impairments:  Abnormal gait, Decreased activity tolerance, Decreased balance, Decreased endurance, Decreased mobility, Decreased range of motion, Decreased strength, Difficulty walking, Increased edema, Increased fascial restricitons, Impaired flexibility, Pain  Visit Diagnosis: Stiffness of left knee, not elsewhere classified - Plan: PT plan of care cert/re-cert  Acute pain of left knee - Plan: PT plan of care cert/re-cert  Muscle weakness (generalized) - Plan: PT plan of care cert/re-cert  Other abnormalities of gait and mobility - Plan: PT plan of care cert/re-cert     Problem List Patient Active Problem List   Diagnosis Date Noted  . Osteoarthritis of left knee 06/15/2019  . Primary osteoarthritis of left knee 06/04/2019  . A-fib ([place]) 02/28/2019  . Chronic systolic heart failure [facility]) 02/28/2019  Rayetta Humphrey, PT  CLT [phone] 07/03/2019, 11:05 AM  [facility] [address] [place], [place], [address] Phone: [phone]   Fax:  [phone]  Name: Sara Leach MRN: [id number] Date of Birth: Apr 17, 1941

## 2019-07-03 NOTE — Patient Instructions (Addendum)
Functional Quadriceps: Sit to Stand    Sit on edge of chair, feet flat on floor. Stand upright, extending knees fully. Repeat __10__ times per set. Do __1__ sets per session. Do ___2_ sessions per day.  http://orth.exer.us/734   Copyright  VHI. All rights reserved.  Functional Quadriceps: Chair Squat    Keeping feet flat on floor, shoulder width apart, squat as low as is comfortable. Use support as necessary. Repeat __10__ times per set. Do __1__ sets per session. Do __2__ sessions per day.  http://orth.exer.us/736   Copyright  VHI. All rights reserved.

## 2019-07-04 ENCOUNTER — Ambulatory Visit (HOSPITAL_COMMUNITY): Payer: Medicare Other | Admitting: Physical Therapy

## 2019-07-04 ENCOUNTER — Encounter (HOSPITAL_COMMUNITY): Payer: Self-pay | Admitting: Physical Therapy

## 2019-07-04 DIAGNOSIS — M6281 Muscle weakness (generalized): Secondary | ICD-10-CM

## 2019-07-04 DIAGNOSIS — M25662 Stiffness of left knee, not elsewhere classified: Secondary | ICD-10-CM

## 2019-07-04 DIAGNOSIS — R2689 Other abnormalities of gait and mobility: Secondary | ICD-10-CM | POA: Diagnosis not present

## 2019-07-04 DIAGNOSIS — M25562 Pain in left knee: Secondary | ICD-10-CM

## 2019-07-04 NOTE — Therapy (Signed)
[facility] [address] [place], [place], [address] Phone: [phone]   Fax:  [phone]  Physical Therapy Treatment  Patient Details  Name: Sara Leach MRN: [id number] Date of Birth: 05/12/1941 Referring Provider (PT): Elizabeth Sauer   Encounter Date: 07/04/2019  PT End of Session - 07/04/19 1731    Visit Number  2    Number of Visits  18    Date for PT Re-Evaluation  08/14/19    Authorization Type  BCBS medicare    PT Start Time  [id number]    PT Stop Time  1810    PT Time Calculation (min)  40 min    Activity Tolerance  Patient tolerated treatment well;Patient limited by fatigue    Behavior During Therapy  [facility] for tasks assessed/performed       Past Medical History:  Diagnosis Date  . Atrial fibrillation ([place]) 01/2019  . Breast cancer ([place])   . Cataracts, bilateral   . GERD (gastroesophageal reflux disease)   . Hyperlipidemia   . Hypertension   . Hyperthyroidism   . Obesity   . Osteoarthritis   . Systolic heart failure ([place]) 01/2019   EF 40% by echo done 01/17/2019    Past Surgical History:  Procedure Laterality Date  . BREAST LUMPECTOMY  07/09/2008  . MASTECTOMY, PARTIAL  07/09/2008  . REPLACEMENT TOTAL KNEE Right 2007  . RIGHT/LEFT HEART CATH AND CORONARY ANGIOGRAPHY N/A 03/07/2019   Procedure: RIGHT/LEFT HEART CATH AND CORONARY ANGIOGRAPHY;  Surgeon: Jolaine Artist, MD;  Location: [facility];  Service: Cardiovascular;  Laterality: N/A;  . TOTAL KNEE ARTHROPLASTY Left 06/15/2019   Procedure: TOTAL KNEE ARTHROPLASTY WITH ADDUCTOR CANAL;  Surgeon: Sydnee Cabal, MD;  Location: [facility];  Service: Orthopedics;  Laterality: Left;    There were no vitals filed for this visit.  Subjective Assessment - 07/04/19 1730    Subjective  Patient says she has been working on her home exercises and was able to do sit to stands without using her hands. Patient says she took a Tylenol earlier today and has not really  had too much pain today.    Pertinent History  OA    Limitations  Sitting;Standing;Walking;House hold activities    How long can you sit comfortably?  no problem    How long can you stand comfortably?  5-10 minutes    How long can you walk comfortably?  with walker 2-3 minutes    Patient Stated Goals  To be able to go up her steps to sleep in her bedroom upstairs, To walk for 20 minutes for exercises, ride a stationary bike, to be able to look after her grandchildren, stop pain medication    Currently in Pain?  No/denies    Pain Onset  1 to 4 weeks ago                       [facility] Adult PT Treatment/Exercise - 07/04/19 0001      Knee/Hip Exercises: Stretches   Knee: Self-Stretch to increase Flexion  Left;5 reps;10 seconds    Knee: Self-Stretch Limitations  knee drivers on 10 inch box     Gastroc Stretch  Both;3 reps;30 seconds   on slope      Knee/Hip Exercises: Standing   Heel Raises  Both;15 reps    Heel Raises Limitations  toe raises x15 both     Forward Step Up  Left;10 reps;Step Height: 4";Hand Hold: 2  Other Standing Knee Exercises  standing knee flexion x15; LT      Knee/Hip Exercises: Seated   Sit to Sand  10 reps;with UE support      Knee/Hip Exercises: Supine   Quad Sets  10 reps;Left   5 second holds   Heel Slides  15 reps;Left    Other Supine Knee/Hip Exercises  glute set; 10 x 5 sec      Manual Therapy   Manual Therapy  Edema management    Manual therapy comments  All manuals performed separate from other activity    Edema Management  retrograde message to LLE, with leg elevated, for improved fluid return           Balance Exercises - 07/04/19 1813      Balance Exercises: Standing   Tandem Stance  3 reps;Eyes open;30 secs          PT Short Term Goals - 07/03/19 1041      PT SHORT TERM GOAL #1   Title  PT Lt knee ROM to be less than 5 to 110 degrees to allow pt to ambulate with more normalized gt and squat easier to pick items off  of the floor.    Time  3    Period  Weeks    Status  New    Target Date  07/24/19      PT SHORT TERM GOAL #2   Title  PT LE strength bilaterally to improve 1/2 grade to allow pt to feel confident walking with a cane indoors.    Time  3    Period  Weeks    Status  New      PT SHORT TERM GOAL #3   Title  PT to be able to stand for 20 minutes without resting to begin making easy small meals    Time  3    Period  Weeks    Status  New      PT SHORT TERM GOAL #4   Title  PT to be able to walk for 10 minutes with a cane with no increase of left knee pain.    Time  3    Period  Weeks    Status  New        PT Long Term Goals - 07/03/19 1044      PT LONG TERM GOAL #1   Title  PT LT knee ROM to be less than three to 120 to allow pt to squat  easier to take care of her great grandchildren.    Time  6    Period  Weeks    Status  New    Target Date  08/14/19      PT LONG TERM GOAL #2   Title  PT LE strength bilaterally to be increased one grade to be able to go up and down one flight of steps to be able to sleep in her bedroom.    Time  3    Period  Weeks    Status  New      PT LONG TERM GOAL #3   Title  PT to be able to balance for 10 seconds on each LE to allow pt to feel confident walking outside on uneven ground with a cane.    Time  6    Period  Weeks    Status  New      PT LONG TERM GOAL #4   Title  PT pain in her LT  knee to be no greater than a 1/10 to allow pt to be up for an hour at a time without having to sit to be able to watch her great grandchildren and to complete her cooking and housecleaning.    Time  6    Period  Weeks    Status  New            Plan - 07/04/19 1814    Clinical Impression Statement  Initiated treatment today. Patient tolerated session well. Patient demos ongoing limitation in LT knee AROM and significant edema in LLE. Edema improved with manual treatment as patient was able to fit into shoes easier. Patient eductaed on proper form and  function with all added activity. Patient noted intermittend SOB during exercise and required a few short rests which improved symptomes.    Examination-Activity Limitations  Bed Mobility;Bathing;Bend;Carry;Dressing;Lift;Locomotion Level;Squat;Stairs;Stand    Examination-Participation Restrictions  Church;Cleaning;Community Activity;Laundry;Meal Prep;Yard Work    Rehab Potential  Good    PT Frequency  3x / week    PT Duration  6 weeks    PT Treatment/Interventions  Therapeutic activities;Therapeutic exercise;Balance training;Functional mobility training;Stair training;Gait training;Patient/family education;Manual techniques;Passive range of motion    PT Next Visit Plan  Continue to progress LLE strength and functional activity as tolerated. Continue manual for edema and pain. add sidestepping next visit.    PT Home Exercise Plan  HH has given pt HEP; added squats and sit to stands.    Consulted and Agree with Plan of Care  Patient       Patient will benefit from skilled therapeutic intervention in order to improve the following deficits and impairments:  Abnormal gait, Decreased activity tolerance, Decreased balance, Decreased endurance, Decreased mobility, Decreased range of motion, Decreased strength, Difficulty walking, Increased edema, Increased fascial restricitons, Impaired flexibility, Pain  Visit Diagnosis: Stiffness of left knee, not elsewhere classified  Acute pain of left knee  Muscle weakness (generalized)  Other abnormalities of gait and mobility     Problem List Patient Active Problem List   Diagnosis Date Noted  . Osteoarthritis of left knee 06/15/2019  . Primary osteoarthritis of left knee 06/04/2019  . A-fib ([place]) 02/28/2019  . Chronic systolic heart failure ([place]) 02/28/2019   6:22 PM, 07/04/19 Josue Hector PT DPT  Physical Therapist with [facility]  [phone] [place] [address] [place], [place], [address] Phone: [phone]   Fax:  [phone]  Name: Sara Leach MRN: [id number] Date of Birth: [birth year]

## 2019-07-08 DIAGNOSIS — G4733 Obstructive sleep apnea (adult) (pediatric): Secondary | ICD-10-CM

## 2019-07-10 ENCOUNTER — Encounter (HOSPITAL_COMMUNITY): Payer: Self-pay | Admitting: Physical Therapy

## 2019-07-10 ENCOUNTER — Ambulatory Visit (HOSPITAL_COMMUNITY): Payer: Medicare Other | Attending: Physician Assistant | Admitting: Physical Therapy

## 2019-07-10 ENCOUNTER — Other Ambulatory Visit: Payer: Self-pay

## 2019-07-10 DIAGNOSIS — M25662 Stiffness of left knee, not elsewhere classified: Secondary | ICD-10-CM | POA: Diagnosis not present

## 2019-07-10 DIAGNOSIS — M25562 Pain in left knee: Secondary | ICD-10-CM | POA: Diagnosis not present

## 2019-07-10 DIAGNOSIS — R2689 Other abnormalities of gait and mobility: Secondary | ICD-10-CM | POA: Diagnosis not present

## 2019-07-10 DIAGNOSIS — M6281 Muscle weakness (generalized): Secondary | ICD-10-CM

## 2019-07-10 NOTE — Therapy (Signed)
[facility] [address] [place], [place], [address] Phone: [phone]   Fax:  [phone]  Physical Therapy Treatment  Patient Details  Name: Sara Leach MRN: [id number] Date of Birth: 04-Apr-1941 Referring Provider (PT): Elizabeth Sauer   Encounter Date: 07/10/2019  PT End of Session - 07/10/19 1346    Visit Number  3    Number of Visits  18    Date for PT Re-Evaluation  08/14/19    Authorization Type  [employer]BS medicare    PT Start Time  1344    PT Stop Time  1425    PT Time Calculation (min)  41 min    Equipment Utilized During Treatment  --    Activity Tolerance  Patient tolerated treatment well;Patient limited by fatigue    Behavior During Therapy  [facility] for tasks assessed/performed       Past Medical History:  Diagnosis Date  . Atrial fibrillation ([place]) 01/2019  . Breast cancer ([place])   . Cataracts, bilateral   . GERD (gastroesophageal reflux disease)   . Hyperlipidemia   . Hypertension   . Hyperthyroidism   . Obesity   . Osteoarthritis   . Systolic heart failure ([place]) 01/2019   EF 40% by echo done 01/17/2019    Past Surgical History:  Procedure Laterality Date  . BREAST LUMPECTOMY  07/09/2008  . MASTECTOMY, PARTIAL  07/09/2008  . REPLACEMENT TOTAL KNEE Right 2007  . RIGHT/LEFT HEART CATH AND CORONARY ANGIOGRAPHY N/A 03/07/2019   Procedure: RIGHT/LEFT HEART CATH AND CORONARY ANGIOGRAPHY;  Surgeon: Jolaine Artist, MD;  Location: [facility];  Service: Cardiovascular;  Laterality: N/A;  . TOTAL KNEE ARTHROPLASTY Left 06/15/2019   Procedure: TOTAL KNEE ARTHROPLASTY WITH ADDUCTOR CANAL;  Surgeon: Sydnee Cabal, MD;  Location: [facility];  Service: Orthopedics;  Laterality: Left;    There were no vitals filed for this visit.  Subjective Assessment - 07/10/19 1344    Subjective  Patient says she is doing well and currently has no pain. Patient says she is doing her home exercise with no issues. Patient says she  had been taking pain meds at night to help sleep, but just "finished it up" and has only had a Tylenol so far today.    Pertinent History  OA    Limitations  Sitting;Standing;Walking;House hold activities    How long can you sit comfortably?  no problem    How long can you stand comfortably?  5-10 minutes    How long can you walk comfortably?  with walker 2-3 minutes    Patient Stated Goals  To be able to go up her steps to sleep in her bedroom upstairs, To walk for 20 minutes for exercises, ride a stationary bike, to be able to look after her grandchildren, stop pain medication    Currently in Pain?  No/denies    Pain Onset  1 to 4 weeks ago                       [facility] Adult PT Treatment/Exercise - 07/10/19 0001      Knee/Hip Exercises: Stretches   Knee: Self-Stretch to increase Flexion  Left;5 reps;10 seconds    Knee: Self-Stretch Limitations  knee drivers on 10 inch box     Gastroc Stretch  Both;3 reps;30 seconds      Knee/Hip Exercises: Standing   Heel Raises  Both;20 reps    Heel Raises Limitations  toe raises x20 both  Lateral Step Up  Left;15 reps;Step Height: 4";Hand Hold: 2    Forward Step Up  Left;Step Height: 4";Hand Hold: 2;15 reps      Knee/Hip Exercises: Seated   Sit to Sand  10 reps;with UE support      Knee/Hip Exercises: Supine   Quad Sets  Left;15 reps   5 sec hold   Heel Slides  15 reps;Left    Knee Extension  AROM    Knee Extension Limitations  -5    Knee Flexion  AROM    Knee Flexion Limitations  104    Other Supine Knee/Hip Exercises  glute set; 15 x 5 sec      Manual Therapy   Manual Therapy  Edema management    Manual therapy comments  All manuals performed separate from other activity    Edema Management  retrograde message to LLE, with leg elevated, for improved fluid return           Balance Exercises - 07/10/19 1426      Balance Exercises: Standing   Tandem Stance  3 reps;Eyes open;30 secs          PT Short Term  Goals - 07/03/19 1041      PT SHORT TERM GOAL #1   Title  PT Lt knee ROM to be less than 5 to 110 degrees to allow pt to ambulate with more normalized gt and squat easier to pick items off of the floor.    Time  3    Period  Weeks    Status  New    Target Date  07/24/19      PT SHORT TERM GOAL #2   Title  PT LE strength bilaterally to improve 1/2 grade to allow pt to feel confident walking with a cane indoors.    Time  3    Period  Weeks    Status  New      PT SHORT TERM GOAL #3   Title  PT to be able to stand for 20 minutes without resting to begin making easy small meals    Time  3    Period  Weeks    Status  New      PT SHORT TERM GOAL #4   Title  PT to be able to walk for 10 minutes with a cane with no increase of left knee pain.    Time  3    Period  Weeks    Status  New        PT Long Term Goals - 07/03/19 1044      PT LONG TERM GOAL #1   Title  PT LT knee ROM to be less than three to 120 to allow pt to squat  easier to take care of her great grandchildren.    Time  6    Period  Weeks    Status  New    Target Date  08/14/19      PT LONG TERM GOAL #2   Title  PT LE strength bilaterally to be increased one grade to be able to go up and down one flight of steps to be able to sleep in her bedroom.    Time  3    Period  Weeks    Status  New      PT LONG TERM GOAL #3   Title  PT to be able to balance for 10 seconds on each LE to allow pt to feel confident walking outside  on uneven ground with a cane.    Time  6    Period  Weeks    Status  New      PT LONG TERM GOAL #4   Title  PT pain in her LT knee to be no greater than a 1/10 to allow pt to be up for an hour at a time without having to sit to be able to watch her great grandchildren and to complete her cooking and housecleaning.    Time  6    Period  Weeks    Status  New            Plan - 07/10/19 1428    Clinical Impression Statement  Patient tolerated session well, but noted increased fatigue with  activity.  Patient requires several breaks for rest due to fatigue. Patient was able to progress reps with table exercise with no increased complaint of knee pain. Patient progressing well toward AROM LTGs. Patient required verbal cueing for erect posturing and tactile cueing for maintaining balance during tandem stance.    Examination-Activity Limitations  Bed Mobility;Bathing;Bend;Carry;Dressing;Lift;Locomotion Level;Squat;Stairs;Stand    Examination-Participation Restrictions  Church;Cleaning;Community Activity;Laundry;Meal Prep;Yard Work    Rehab Potential  Good    PT Frequency  3x / week    PT Duration  6 weeks    PT Treatment/Interventions  Therapeutic activities;Therapeutic exercise;Balance training;Functional mobility training;Stair training;Gait training;Patient/family education;Manual techniques;Passive range of motion    PT Next Visit Plan  Continue to progress LLE strength and functional activity as tolerated. Continue manual for edema and pain. add sidestepping next visit.    PT Home Exercise Plan  HH has given pt HEP; added squats and sit to stands.    Consulted and Agree with Plan of Care  Patient       Patient will benefit from skilled therapeutic intervention in order to improve the following deficits and impairments:  Abnormal gait, Decreased activity tolerance, Decreased balance, Decreased endurance, Decreased mobility, Decreased range of motion, Decreased strength, Difficulty walking, Increased edema, Increased fascial restricitons, Impaired flexibility, Pain  Visit Diagnosis: Stiffness of left knee, not elsewhere classified  Acute pain of left knee  Muscle weakness (generalized)  Other abnormalities of gait and mobility     Problem List Patient Active Problem List   Diagnosis Date Noted  . Osteoarthritis of left knee 06/15/2019  . Primary osteoarthritis of left knee 06/04/2019  . A-fib ([place]) 02/28/2019  . Chronic systolic heart failure ([place]) 02/28/2019   2:39  PM, 07/10/19 Josue Hector PT DPT  Physical Therapist with [facility]  [phone] [place] [address] [place], [place], [address] Phone: [phone]   Fax:  [phone]  Name: Rashay Benites MRN: [id number] Date of Birth: 1941/02/01

## 2019-07-11 ENCOUNTER — Encounter (HOSPITAL_COMMUNITY): Payer: Self-pay | Admitting: Physical Therapy

## 2019-07-11 ENCOUNTER — Ambulatory Visit (HOSPITAL_COMMUNITY): Payer: Medicare Other | Admitting: Physical Therapy

## 2019-07-11 DIAGNOSIS — M6281 Muscle weakness (generalized): Secondary | ICD-10-CM | POA: Diagnosis not present

## 2019-07-11 DIAGNOSIS — M25562 Pain in left knee: Secondary | ICD-10-CM

## 2019-07-11 DIAGNOSIS — M25662 Stiffness of left knee, not elsewhere classified: Secondary | ICD-10-CM | POA: Diagnosis not present

## 2019-07-11 DIAGNOSIS — R2689 Other abnormalities of gait and mobility: Secondary | ICD-10-CM

## 2019-07-11 NOTE — Therapy (Signed)
[facility] [place], [place], [address] Phone: [phone]   Fax:  [phone]  Physical Therapy Treatment  Patient Details  Name: Sara Leach MRN: [id number] Date of Birth: 08/02/1941 Referring Provider (PT): Elizabeth Sauer   Encounter Date: 07/11/2019  PT End of Session - 07/11/19 1036    Visit Number  4    Number of Visits  18    Date for PT Re-Evaluation  08/14/19    Authorization Type  BCBS medicare    Authorization - Visit Number  4    Authorization - Number of Visits  10    PT Start Time  [id number]    PT Stop Time  1109    PT Time Calculation (min)  38 min    Activity Tolerance  Patient tolerated treatment well;Patient limited by fatigue    Behavior During Therapy  [facility] for tasks assessed/performed       Past Medical History:  Diagnosis Date  . Atrial fibrillation ([place]) 01/2019  . Breast cancer ([place])   . Cataracts, bilateral   . GERD (gastroesophageal reflux disease)   . Hyperlipidemia   . Hypertension   . Hyperthyroidism   . Obesity   . Osteoarthritis   . Systolic heart failure ([place]) 01/2019   EF 40% by echo done 01/17/2019    Past Surgical History:  Procedure Laterality Date  . BREAST LUMPECTOMY  07/09/2008  . MASTECTOMY, PARTIAL  07/09/2008  . REPLACEMENT TOTAL KNEE Right 2007  . RIGHT/LEFT HEART CATH AND CORONARY ANGIOGRAPHY N/A 03/07/2019   Procedure: RIGHT/LEFT HEART CATH AND CORONARY ANGIOGRAPHY;  Surgeon: Jolaine Artist, MD;  Location: [facility];  Service: Cardiovascular;  Laterality: N/A;  . TOTAL KNEE ARTHROPLASTY Left 06/15/2019   Procedure: TOTAL KNEE ARTHROPLASTY WITH ADDUCTOR CANAL;  Surgeon: Sydnee Cabal, MD;  Location: [facility];  Service: Orthopedics;  Laterality: Left;    There were no vitals filed for this visit.  Subjective Assessment - 07/11/19 1035    Subjective  Patient denied any pain. Patient reported understanding and feeling good about exercises at home.     Pertinent History  OA    Limitations  Sitting;Standing;Walking;House hold activities    How long can you sit comfortably?  no problem    How long can you stand comfortably?  5-10 minutes    How long can you walk comfortably?  with walker 2-3 minutes    Patient Stated Goals  To be able to go up her steps to sleep in her bedroom upstairs, To walk for 20 minutes for exercises, ride a stationary bike, to be able to look after her grandchildren, stop pain medication    Currently in Pain?  No/denies                       [facility] Adult PT Treatment/Exercise - 07/11/19 0001      Knee/Hip Exercises: Stretches   Knee: Self-Stretch to increase Flexion  Left;10 seconds   10 reps   Knee: Self-Stretch Limitations  knee drivers on 12 inch box     Gastroc Stretch  Both;3 reps;30 seconds    Gastroc Stretch Limitations  on slant board      Knee/Hip Exercises: Standing   Heel Raises  Both;20 reps    Heel Raises Limitations  toe raises x20 both     Gait Training  Ambulation with walker, cueing for trunk upright x 226 feet      Knee/Hip Exercises:  Seated   Sit to Sand  1 set;10 reps;without UE support      Knee/Hip Exercises: Supine   Heel Slides  15 reps;Left    Knee Extension  AROM    Knee Extension Limitations  4   lacking 4 degrees   Knee Flexion  AROM    Knee Flexion Limitations  103    Other Supine Knee/Hip Exercises  glute set; 15 x 5 sec   Attempted bridge, very minimal ROM with this     Manual Therapy   Manual Therapy  Edema management;Joint mobilization    Manual therapy comments  All manuals performed separate from other activity    Edema Management  retrograde message to LLE, with leg elevated, for improved fluid return     Joint Mobilization  Patellar glides grade III superior and inferior 20 seconds x 3 each direction              PT Education - 07/11/19 1035    Education Details  Discussed purpose and technique of interventions throughout session.     Person(s) Educated  Patient    Methods  Explanation    Comprehension  Verbalized understanding       PT Short Term Goals - 07/03/19 1041      PT SHORT TERM GOAL #1   Title  PT Lt knee ROM to be less than 5 to 110 degrees to allow pt to ambulate with more normalized gt and squat easier to pick items off of the floor.    Time  3    Period  Weeks    Status  New    Target Date  07/24/19      PT SHORT TERM GOAL #2   Title  PT LE strength bilaterally to improve 1/2 grade to allow pt to feel confident walking with a cane indoors.    Time  3    Period  Weeks    Status  New      PT SHORT TERM GOAL #3   Title  PT to be able to stand for 20 minutes without resting to begin making easy small meals    Time  3    Period  Weeks    Status  New      PT SHORT TERM GOAL #4   Title  PT to be able to walk for 10 minutes with a cane with no increase of left knee pain.    Time  3    Period  Weeks    Status  New        PT Long Term Goals - 07/03/19 1044      PT LONG TERM GOAL #1   Title  PT LT knee ROM to be less than three to 120 to allow pt to squat  easier to take care of her great grandchildren.    Time  6    Period  Weeks    Status  New    Target Date  08/14/19      PT LONG TERM GOAL #2   Title  PT LE strength bilaterally to be increased one grade to be able to go up and down one flight of steps to be able to sleep in her bedroom.    Time  3    Period  Weeks    Status  New      PT LONG TERM GOAL #3   Title  PT to be able to balance for 10 seconds on each  LE to allow pt to feel confident walking outside on uneven ground with a cane.    Time  6    Period  Weeks    Status  New      PT LONG TERM GOAL #4   Title  PT pain in her LT knee to be no greater than a 1/10 to allow pt to be up for an hour at a time without having to sit to be able to watch her great grandchildren and to complete her cooking and housecleaning.    Time  6    Period  Weeks    Status  New             Plan - 07/11/19 1115    Clinical Impression Statement  Continued with established POC. This session progressed sit to stands without upper extremities as well as included patellar glides to improve knee mobility. Patient required minimal cueing to improve form with gait with RW this session. Continued to note significant edema, and discussed that patient has compression garments and will put them on once she gets home. Plan to trial gait training with cane next session.    Examination-Activity Limitations  Bed Mobility;Bathing;Bend;Carry;Dressing;Lift;Locomotion Level;Squat;Stairs;Stand    Examination-Participation Restrictions  Church;Cleaning;Community Activity;Laundry;Meal Prep;Yard Work    Rehab Potential  Good    PT Frequency  3x / week    PT Duration  6 weeks    PT Treatment/Interventions  Therapeutic activities;Therapeutic exercise;Balance training;Functional mobility training;Stair training;Gait training;Patient/family education;Manual techniques;Passive range of motion    PT Next Visit Plan  Continue to progress LLE strength and functional activity as tolerated. Continue manual for edema and pain. Trial gait training with cane next session. add sidestepping next visit.    PT Home Exercise Plan  HH has given pt HEP; added squats and sit to stands.    Consulted and Agree with Plan of Care  Patient       Patient will benefit from skilled therapeutic intervention in order to improve the following deficits and impairments:  Abnormal gait, Decreased activity tolerance, Decreased balance, Decreased endurance, Decreased mobility, Decreased range of motion, Decreased strength, Difficulty walking, Increased edema, Increased fascial restricitons, Impaired flexibility, Pain  Visit Diagnosis: Stiffness of left knee, not elsewhere classified  Acute pain of left knee  Muscle weakness (generalized)  Other abnormalities of gait and mobility     Problem List Patient Active Problem  List   Diagnosis Date Noted  . Osteoarthritis of left knee 06/15/2019  . Primary osteoarthritis of left knee 06/04/2019  . A-fib ([place]) 02/28/2019  . Chronic systolic heart failure ([place]) 02/28/2019   Clarene Critchley PT, DPT 11:17 AM, 07/11/19 [facility] [address] [place], [place], [address] Phone: [phone]   Fax:  [phone]  Name: Aulani Slappey MRN: [id number] Date of Birth: September 24, 1940

## 2019-07-13 ENCOUNTER — Ambulatory Visit (HOSPITAL_COMMUNITY): Payer: Medicare Other | Admitting: Physical Therapy

## 2019-07-13 ENCOUNTER — Encounter (HOSPITAL_COMMUNITY): Payer: Self-pay | Admitting: Physical Therapy

## 2019-07-13 ENCOUNTER — Other Ambulatory Visit: Payer: Self-pay

## 2019-07-13 DIAGNOSIS — M25662 Stiffness of left knee, not elsewhere classified: Secondary | ICD-10-CM | POA: Diagnosis not present

## 2019-07-13 DIAGNOSIS — M6281 Muscle weakness (generalized): Secondary | ICD-10-CM

## 2019-07-13 DIAGNOSIS — R2689 Other abnormalities of gait and mobility: Secondary | ICD-10-CM

## 2019-07-13 DIAGNOSIS — M25562 Pain in left knee: Secondary | ICD-10-CM | POA: Diagnosis not present

## 2019-07-13 NOTE — Therapy (Signed)
[place], [place], [address] Phone: [phone]   Fax:  [phone]  Physical Therapy Treatment  Patient Details  Name: Sara Leach MRN: [id number] Date of Birth: 05/22/1941 Referring Provider (PT): Elizabeth Sauer   Encounter Date: 07/13/2019  PT End of Session - 07/13/19 1309    Visit Number  5    Number of Visits  18    Date for PT Re-Evaluation  08/14/19    Authorization Type  BCBS medicare    Authorization - Visit Number  5    Authorization - Number of Visits  10    PT Start Time  [id number]    PT Stop Time  1343    PT Time Calculation (min)  38 min    Activity Tolerance  Patient tolerated treatment well;Patient limited by fatigue    Behavior During Therapy  [facility] for tasks assessed/performed       Past Medical History:  Diagnosis Date  . Atrial fibrillation ([place]) 01/2019  . Breast cancer ([place])   . Cataracts, bilateral   . GERD (gastroesophageal reflux disease)   . Hyperlipidemia   . Hypertension   . Hyperthyroidism   . Obesity   . Osteoarthritis   . Systolic heart failure ([place]) 01/2019   EF 40% by echo done 01/17/2019    Past Surgical History:  Procedure Laterality Date  . BREAST LUMPECTOMY  07/09/2008  . MASTECTOMY, PARTIAL  07/09/2008  . REPLACEMENT TOTAL KNEE Right 2007  . RIGHT/LEFT HEART CATH AND CORONARY ANGIOGRAPHY N/A 03/07/2019   Procedure: RIGHT/LEFT HEART CATH AND CORONARY ANGIOGRAPHY;  Surgeon: Jolaine Artist, MD;  Location: [facility];  Service: Cardiovascular;  Laterality: N/A;  . TOTAL KNEE ARTHROPLASTY Left 06/15/2019   Procedure: TOTAL KNEE ARTHROPLASTY WITH ADDUCTOR CANAL;  Surgeon: Sydnee Cabal, MD;  Location: [facility];  Service: Orthopedics;  Laterality: Left;    There were no vitals filed for this visit.  Subjective Assessment - 07/13/19 1308    Subjective  Patient denied any pain.    Pertinent History  OA    Limitations  Sitting;Standing;Walking;House hold  activities    How long can you sit comfortably?  no problem    How long can you stand comfortably?  5-10 minutes    How long can you walk comfortably?  with walker 2-3 minutes    Patient Stated Goals  To be able to go up her steps to sleep in her bedroom upstairs, To walk for 20 minutes for exercises, ride a stationary bike, to be able to look after her grandchildren, stop pain medication    Currently in Pain?  No/denies                       [facility] Adult PT Treatment/Exercise - 07/13/19 0001      Knee/Hip Exercises: Stretches   Knee: Self-Stretch to increase Flexion  Left;10 seconds   10 reps   Knee: Self-Stretch Limitations  knee drivers on 12 inch box     Gastroc Stretch  Both;3 reps;30 seconds    Gastroc Stretch Limitations  on slant board      Knee/Hip Exercises: Standing   Heel Raises  Both;20 reps    Heel Raises Limitations  toe raises x20 both     Gait Training  Ambulation with SPC, verbal cues on sequence 226 feet.     Other Standing Knee Exercises  Sidestepping in // bars x3 RT  Knee/Hip Exercises: Seated   Sit to Sand  1 set;10 reps;without UE support      Knee/Hip Exercises: Supine   Heel Slides  15 reps;Left    Knee Extension  AROM    Knee Extension Limitations  4    Knee Flexion  AROM    Knee Flexion Limitations  103    Other Supine Knee/Hip Exercises  glute set; 15 x 5 sec      Manual Therapy   Manual Therapy  Edema management;Joint mobilization    Manual therapy comments  All manuals performed separate from other activity    Edema Management  retrograde message to LLE, with leg elevated, for improved fluid return     Joint Mobilization  Patellar glides grade III superior and inferior 20 seconds x 3 each direction              PT Education - 07/13/19 1309    Education Details  Gait training with cane this session.    Person(s) Educated  Patient    Methods  Explanation;Demonstration;Verbal cues    Comprehension  Verbalized  understanding       PT Short Term Goals - 07/03/19 1041      PT SHORT TERM GOAL #1   Title  PT Lt knee ROM to be less than 5 to 110 degrees to allow pt to ambulate with more normalized gt and squat easier to pick items off of the floor.    Time  3    Period  Weeks    Status  New    Target Date  07/24/19      PT SHORT TERM GOAL #2   Title  PT LE strength bilaterally to improve 1/2 grade to allow pt to feel confident walking with a cane indoors.    Time  3    Period  Weeks    Status  New      PT SHORT TERM GOAL #3   Title  PT to be able to stand for 20 minutes without resting to begin making easy small meals    Time  3    Period  Weeks    Status  New      PT SHORT TERM GOAL #4   Title  PT to be able to walk for 10 minutes with a cane with no increase of left knee pain.    Time  3    Period  Weeks    Status  New        PT Long Term Goals - 07/03/19 1044      PT LONG TERM GOAL #1   Title  PT LT knee ROM to be less than three to 120 to allow pt to squat  easier to take care of her great grandchildren.    Time  6    Period  Weeks    Status  New    Target Date  08/14/19      PT LONG TERM GOAL #2   Title  PT LE strength bilaterally to be increased one grade to be able to go up and down one flight of steps to be able to sleep in her bedroom.    Time  3    Period  Weeks    Status  New      PT LONG TERM GOAL #3   Title  PT to be able to balance for 10 seconds on each LE to allow pt to feel confident walking outside on uneven  ground with a cane.    Time  6    Period  Weeks    Status  New      PT LONG TERM GOAL #4   Title  PT pain in her LT knee to be no greater than a 1/10 to allow pt to be up for an hour at a time without having to sit to be able to watch her great grandchildren and to complete her cooking and housecleaning.    Time  6    Period  Weeks    Status  New            Plan - 07/13/19 1323    Clinical Impression Statement  Continued with established  POC this session. Progressed gait training to with a cane this session using patient's cane from home. Patient required cueing for sequencing and form. Patient tolerated well, but would benefit from further education. Educated to continue using walker at this time before switching to cane. Patient would benefit from continued skilled physical therapy in order to continue progressing towards functional goals.    Examination-Activity Limitations  Bed Mobility;Bathing;Bend;Carry;Dressing;Lift;Locomotion Level;Squat;Stairs;Stand    Examination-Participation Restrictions  Church;Cleaning;Community Activity;Laundry;Meal Prep;Yard Work    Rehab Potential  Good    PT Frequency  3x / week    PT Duration  6 weeks    PT Treatment/Interventions  Therapeutic activities;Therapeutic exercise;Balance training;Functional mobility training;Stair training;Gait training;Patient/family education;Manual techniques;Passive range of motion    PT Next Visit Plan  Continue to progress LLE strength and functional activity as tolerated. Continue manual for edema and pain.sion. Continue gait training with cane next session.    PT Home Exercise Plan  HH has given pt HEP; added squats and sit to stands.    Consulted and Agree with Plan of Care  Patient       Patient will benefit from skilled therapeutic intervention in order to improve the following deficits and impairments:  Abnormal gait, Decreased activity tolerance, Decreased balance, Decreased endurance, Decreased mobility, Decreased range of motion, Decreased strength, Difficulty walking, Increased edema, Increased fascial restricitons, Impaired flexibility, Pain  Visit Diagnosis: Stiffness of left knee, not elsewhere classified  Acute pain of left knee  Muscle weakness (generalized)  Other abnormalities of gait and mobility     Problem List Patient Active Problem List   Diagnosis Date Noted  . Osteoarthritis of left knee 06/15/2019  . Primary osteoarthritis  of left knee 06/04/2019  . A-fib ([place]) 02/28/2019  . Chronic systolic heart failure ([place]) 02/28/2019   Clarene Critchley PT, DPT 4:02 PM, 07/13/19 [place] [address] [place], [place], [address] Phone: [phone]   Fax:  [phone]  Name: Sara Leach MRN: [id number] Date of Birth: 1941-06-03

## 2019-07-16 ENCOUNTER — Encounter (HOSPITAL_COMMUNITY): Payer: Self-pay | Admitting: Physical Therapy

## 2019-07-16 ENCOUNTER — Ambulatory Visit (HOSPITAL_COMMUNITY): Payer: Medicare Other | Admitting: Physical Therapy

## 2019-07-16 ENCOUNTER — Other Ambulatory Visit: Payer: Self-pay

## 2019-07-16 DIAGNOSIS — M6281 Muscle weakness (generalized): Secondary | ICD-10-CM

## 2019-07-16 DIAGNOSIS — M25562 Pain in left knee: Secondary | ICD-10-CM

## 2019-07-16 DIAGNOSIS — R2689 Other abnormalities of gait and mobility: Secondary | ICD-10-CM

## 2019-07-16 DIAGNOSIS — M25662 Stiffness of left knee, not elsewhere classified: Secondary | ICD-10-CM

## 2019-07-16 NOTE — Therapy (Signed)
[place], [place], [address] Phone: [phone]   Fax:  [phone]  Physical Therapy Treatment  Patient Details  Name: Sara Leach MRN: [id number] Date of Birth: Oct 09, 1940 Referring Provider (PT): Elizabeth Sauer   Encounter Date: 07/16/2019  PT End of Session - 07/16/19 1650    Visit Number  6    Number of Visits  18    Date for PT Re-Evaluation  08/14/19    Authorization Type  [employer] medicare    Authorization - Visit Number  6    Authorization - Number of Visits  10    PT Start Time  [id number]    PT Stop Time  [id number]    PT Time Calculation (min)  48 min    Equipment Utilized During Treatment  Other (comment)   SPC   Activity Tolerance  Patient tolerated treatment well;No increased pain    Behavior During Therapy  WFL for tasks assessed/performed       Past Medical History:  Diagnosis Date  . Atrial fibrillation ([place]) 01/2019  . Breast cancer ([place])   . Cataracts, bilateral   . GERD (gastroesophageal reflux disease)   . Hyperlipidemia   . Hypertension   . Hyperthyroidism   . Obesity   . Osteoarthritis   . Systolic heart failure ([place]) 01/2019   EF 40% by echo done 01/17/2019    Past Surgical History:  Procedure Laterality Date  . BREAST LUMPECTOMY  07/09/2008  . MASTECTOMY, PARTIAL  07/09/2008  . REPLACEMENT TOTAL KNEE Right 2007  . RIGHT/LEFT HEART CATH AND CORONARY ANGIOGRAPHY N/A 03/07/2019   Procedure: RIGHT/LEFT HEART CATH AND CORONARY ANGIOGRAPHY;  Surgeon: Jolaine Artist, MD;  Location: [facility];  Service: Cardiovascular;  Laterality: N/A;  . TOTAL KNEE ARTHROPLASTY Left 06/15/2019   Procedure: TOTAL KNEE ARTHROPLASTY WITH ADDUCTOR CANAL;  Surgeon: Sydnee Cabal, MD;  Location: [facility];  Service: Orthopedics;  Laterality: Left;    There were no vitals filed for this visit.  Subjective Assessment - 07/16/19 1650    Subjective  Patient says she is doing well today. Patient  reports no new issues since last visit. Patient says she has been walking very short distance, to bathroom, using cane with no issues, but says she remains using RW most of the time. Patient reports no pain currently.    Pertinent History  OA    Limitations  Sitting;Standing;Walking;House hold activities    How long can you sit comfortably?  no problem    How long can you stand comfortably?  5-10 minutes    How long can you walk comfortably?  with walker 2-3 minutes    Patient Stated Goals  To be able to go up her steps to sleep in her bedroom upstairs, To walk for 20 minutes for exercises, ride a stationary bike, to be able to look after her grandchildren, stop pain medication    Currently in Pain?  No/denies                       [facility] Adult PT Treatment/Exercise - 07/16/19 0001      Knee/Hip Exercises: Stretches   Knee: Self-Stretch to increase Flexion  Left;10 seconds;5 reps    Knee: Self-Stretch Limitations  knee drivers on 12 inch box     Gastroc Stretch  Both;3 reps;30 seconds    Gastroc Stretch Limitations  on slant board      Knee/Hip Exercises: Aerobic  Recumbent Bike  4 min for ROM      Knee/Hip Exercises: Standing   Forward Step Up  Left;Step Height: 6";Hand Hold: 1;10 reps    Step Down  Left;10 reps;Hand Hold: 2;Step Height: 6"    Gait Training  Ambulation with SPC, verbal cues on sequence intermittent throughout session       Knee/Hip Exercises: Seated   Sit to Sand  1 set;10 reps;without UE support      Knee/Hip Exercises: Supine   Quad Sets  Left;10 reps    Heel Slides  Left;10 reps    Knee Extension  AROM    Knee Extension Limitations  -5    Knee Flexion  AROM    Knee Flexion Limitations  108      Manual Therapy   Manual Therapy  Edema management;Joint mobilization    Manual therapy comments  All manuals performed separate from other activity    Edema Management  retrograde message to LLE, with leg elevated, for improved fluid return      Joint Mobilization  Patellar glides grade III superior and inferior 20 seconds x 3 each direction           Balance Exercises - 07/16/19 1727      Balance Exercises: Standing   Tandem Stance  Eyes open;30 secs;2 reps          PT Short Term Goals - 07/03/19 1041      PT SHORT TERM GOAL #1   Title  PT Lt knee ROM to be less than 5 to 110 degrees to allow pt to ambulate with more normalized gt and squat easier to pick items off of the floor.    Time  3    Period  Weeks    Status  New    Target Date  07/24/19      PT SHORT TERM GOAL #2   Title  PT LE strength bilaterally to improve 1/2 grade to allow pt to feel confident walking with a cane indoors.    Time  3    Period  Weeks    Status  New      PT SHORT TERM GOAL #3   Title  PT to be able to stand for 20 minutes without resting to begin making easy small meals    Time  3    Period  Weeks    Status  New      PT SHORT TERM GOAL #4   Title  PT to be able to walk for 10 minutes with a cane with no increase of left knee pain.    Time  3    Period  Weeks    Status  New        PT Long Term Goals - 07/03/19 1044      PT LONG TERM GOAL #1   Title  PT LT knee ROM to be less than three to 120 to allow pt to squat  easier to take care of her great grandchildren.    Time  6    Period  Weeks    Status  New    Target Date  08/14/19      PT LONG TERM GOAL #2   Title  PT LE strength bilaterally to be increased one grade to be able to go up and down one flight of steps to be able to sleep in her bedroom.    Time  3    Period  Weeks  Status  New      PT LONG TERM GOAL #3   Title  PT to be able to balance for 10 seconds on each LE to allow pt to feel confident walking outside on uneven ground with a cane.    Time  6    Period  Weeks    Status  New      PT LONG TERM GOAL #4   Title  PT pain in her LT knee to be no greater than a 1/10 to allow pt to be up for an hour at a time without having to sit to be able to watch  her great grandchildren and to complete her cooking and housecleaning.    Time  6    Period  Weeks    Status  New            Plan - 07/16/19 1753    Clinical Impression Statement  Patient is progressing well to LTGs. Patient shows good improvement in LT knee AROM, but continues to have slight limitation in full extension. Patient able to progress to 6 inch step with step ups and was able to perform step downs on 6 inch box with no increased knee pain, but required HHA x 2 for stability. Patient ambulating using SPC in clinic today, and cued on proper sequencing and stride length using cane. Patient encouraged to begin using cane more with ambulating in home for increased tolerance. Patient continues to have some difficulty with static balance positions and required min tactile cueing for balance with tandem stance.    Examination-Activity Limitations  Bed Mobility;Bathing;Bend;Carry;Dressing;Lift;Locomotion Level;Squat;Stairs;Stand    Examination-Participation Restrictions  Church;Cleaning;Community Activity;Laundry;Meal Prep;Yard Work    Rehab Potential  Good    PT Frequency  3x / week    PT Duration  6 weeks    PT Treatment/Interventions  Therapeutic activities;Therapeutic exercise;Balance training;Functional mobility training;Stair training;Gait training;Patient/family education;Manual techniques;Passive range of motion    PT Next Visit Plan  Continue to progress functional strength and LT knee AROM as tolerated. Start with warm up on recumbant bike next visit for ROM.    PT Home Exercise Plan  HH has given pt HEP; added squats and sit to stands.    Consulted and Agree with Plan of Care  Patient       Patient will benefit from skilled therapeutic intervention in order to improve the following deficits and impairments:  Abnormal gait, Decreased activity tolerance, Decreased balance, Decreased endurance, Decreased mobility, Decreased range of motion, Decreased strength, Difficulty walking,  Increased edema, Increased fascial restricitons, Impaired flexibility, Pain  Visit Diagnosis: Stiffness of left knee, not elsewhere classified  Acute pain of left knee  Muscle weakness (generalized)  Other abnormalities of gait and mobility     Problem List Patient Active Problem List   Diagnosis Date Noted  . Osteoarthritis of left knee 06/15/2019  . Primary osteoarthritis of left knee 06/04/2019  . A-fib ([place]) 02/28/2019  . Chronic systolic heart failure ([place]) 02/28/2019   5:56 PM, 07/16/19 Josue Hector PT DPT  Physical Therapist with [facility]  [phone] [place] [address] [place], [place], [address] Phone: [phone]   Fax:  [phone]  Name: Sara Leach MRN: [id number] Date of Birth: October 05, 1940

## 2019-07-18 ENCOUNTER — Ambulatory Visit: Payer: Medicare Other

## 2019-07-18 ENCOUNTER — Other Ambulatory Visit: Payer: Self-pay

## 2019-07-18 ENCOUNTER — Ambulatory Visit (HOSPITAL_COMMUNITY): Payer: Medicare Other | Admitting: Physical Therapy

## 2019-07-18 ENCOUNTER — Encounter (HOSPITAL_COMMUNITY): Payer: Self-pay | Admitting: Physical Therapy

## 2019-07-18 DIAGNOSIS — M25662 Stiffness of left knee, not elsewhere classified: Secondary | ICD-10-CM

## 2019-07-18 DIAGNOSIS — M6281 Muscle weakness (generalized): Secondary | ICD-10-CM | POA: Diagnosis not present

## 2019-07-18 DIAGNOSIS — R2689 Other abnormalities of gait and mobility: Secondary | ICD-10-CM | POA: Diagnosis not present

## 2019-07-18 DIAGNOSIS — M25562 Pain in left knee: Secondary | ICD-10-CM | POA: Diagnosis not present

## 2019-07-18 NOTE — Therapy (Addendum)
[place], [place], [address] Phone: [phone]   Fax:  [phone]  Physical Therapy Treatment  Patient Details  Name: Sara Leach MRN: [id number] Date of Birth: 1940/10/22 Referring Provider (PT): Elizabeth Sauer   Encounter Date: 07/18/2019  PT End of Session - 07/18/19 1120    Visit Number  7    Number of Visits  18    Date for PT Re-Evaluation  08/14/19    Authorization Type  [employer] medicare    Authorization - Visit Number  7    Authorization - Number of Visits  10    PT Start Time  [id number]    PT Stop Time  1155    PT Time Calculation (min)  42 min (time on bike unbilled)   Equipment Utilized During Treatment  Other (comment)   SPC   Activity Tolerance  Patient tolerated treatment well;No increased pain    Behavior During Therapy  WFL for tasks assessed/performed       Past Medical History:  Diagnosis Date  . Atrial fibrillation ([place]) 01/2019  . Breast cancer ([facility])   . Cataracts, bilateral   . GERD (gastroesophageal reflux disease)   . Hyperlipidemia   . Hypertension   . Hyperthyroidism   . Obesity   . Osteoarthritis   . Systolic heart failure ([facility]) 01/2019   EF 40% by echo done 01/17/2019    Past Surgical History:  Procedure Laterality Date  . BREAST LUMPECTOMY  07/09/2008  . MASTECTOMY, PARTIAL  07/09/2008  . REPLACEMENT TOTAL KNEE Right 2007  . RIGHT/LEFT HEART CATH AND CORONARY ANGIOGRAPHY N/A 03/07/2019   Procedure: RIGHT/LEFT HEART CATH AND CORONARY ANGIOGRAPHY;  Surgeon: Jolaine Artist, MD;  Location: [facility];  Service: Cardiovascular;  Laterality: N/A;  . TOTAL KNEE ARTHROPLASTY Left 06/15/2019   Procedure: TOTAL KNEE ARTHROPLASTY WITH ADDUCTOR CANAL;  Surgeon: Sydnee Cabal, MD;  Location: [facility];  Service: Orthopedics;  Laterality: Left;    There were no vitals filed for this visit.  Subjective Assessment - 07/18/19 1119    Subjective  Patient reported a little  tenderness, but denied any pain.    Pertinent History  OA    Limitations  Sitting;Standing;Walking;House hold activities    How long can you sit comfortably?  no problem    How long can you stand comfortably?  5-10 minutes    How long can you walk comfortably?  with walker 2-3 minutes    Patient Stated Goals  To be able to go up her steps to sleep in her bedroom upstairs, To walk for 20 minutes for exercises, ride a stationary bike, to be able to look after her grandchildren, stop pain medication    Currently in Pain?  No/denies                       [facility] Adult PT Treatment/Exercise - 07/18/19 0001      Knee/Hip Exercises: Stretches   Knee: Self-Stretch to increase Flexion  Left;10 seconds;5 reps    Knee: Self-Stretch Limitations  knee drivers on 12 inch box     Gastroc Stretch  Both;3 reps;30 seconds    Gastroc Stretch Limitations  on slant board      Knee/Hip Exercises: Aerobic   Recumbent Bike  3 min for ROM. Seat 13, level 3.       Knee/Hip Exercises: Standing   Hip Abduction  Stengthening;Right;Left;1 set;15 reps;Knee straight;Other (comment)  Abduction Limitations  RTB    Hip Extension  Stengthening;Right;Left;1 set;15 reps;Knee straight    Extension Limitations  RTB    Lateral Step Up  Left;Hand Hold: 2;10 reps;Step Height: 6"    Forward Step Up  Left;Step Height: 6";Hand Hold: 1;10 reps    Gait Training  Ambulation 226' with SPC, verbal cues to reduce circumduction on sequence intermittent throughout session       Knee/Hip Exercises: Seated   Sit to Sand  1 set;10 reps;without UE support      Knee/Hip Exercises: Supine   Quad Sets  Left;10 reps    Heel Slides  Left;10 reps    Knee Extension  AROM    Knee Extension Limitations  5   Lacking 5   Knee Flexion  AROM    Knee Flexion Limitations  108      Manual Therapy   Manual Therapy  Edema management;Joint mobilization    Manual therapy comments  All manuals performed separate from other activity     Edema Management  retrograde message to LLE, with leg elevated, for improved fluid return     Joint Mobilization  Patellar glides grade III superior and inferior 20 seconds x 3 each direction                PT Short Term Goals - 07/03/19 1041      PT SHORT TERM GOAL #1   Title  PT Lt knee ROM to be less than 5 to 110 degrees to allow pt to ambulate with more normalized gt and squat easier to pick items off of the floor.    Time  3    Period  Weeks    Status  New    Target Date  07/24/19      PT SHORT TERM GOAL #2   Title  PT LE strength bilaterally to improve 1/2 grade to allow pt to feel confident walking with a cane indoors.    Time  3    Period  Weeks    Status  New      PT SHORT TERM GOAL #3   Title  PT to be able to stand for 20 minutes without resting to begin making easy small meals    Time  3    Period  Weeks    Status  New      PT SHORT TERM GOAL #4   Title  PT to be able to walk for 10 minutes with a cane with no increase of left knee pain.    Time  3    Period  Weeks    Status  New        PT Long Term Goals - 07/03/19 1044      PT LONG TERM GOAL #1   Title  PT LT knee ROM to be less than three to 120 to allow pt to squat  easier to take care of her great grandchildren.    Time  6    Period  Weeks    Status  New    Target Date  08/14/19      PT LONG TERM GOAL #2   Title  PT LE strength bilaterally to be increased one grade to be able to go up and down one flight of steps to be able to sleep in her bedroom.    Time  3    Period  Weeks    Status  New      PT LONG TERM  GOAL #3   Title  PT to be able to balance for 10 seconds on each LE to allow pt to feel confident walking outside on uneven ground with a cane.    Time  6    Period  Weeks    Status  New      PT LONG TERM GOAL #4   Title  PT pain in her LT knee to be no greater than a 1/10 to allow pt to be up for an hour at a time without having to sit to be able to watch her great  grandchildren and to complete her cooking and housecleaning.    Time  6    Period  Weeks    Status  New            Plan - 07/18/19 1201    Clinical Impression Statement  Continued with established POC this session. Patient ambulating with cane this session with good carryover, however, with noted circumduction of the operated LE improved with verbal cues to bend the knee more. Patient's ROM has remained steady. This session added more exercises to work on strength including hip extension and abduction with a theraband which patient tolerated well.    Examination-Activity Limitations  Bed Mobility;Bathing;Bend;Carry;Dressing;Lift;Locomotion Level;Squat;Stairs;Stand    Examination-Participation Restrictions  Church;Cleaning;Community Activity;Laundry;Meal Prep;Yard Work    Rehab Potential  Good    PT Frequency  3x / week    PT Duration  6 weeks    PT Treatment/Interventions  Therapeutic activities;Therapeutic exercise;Balance training;Functional mobility training;Stair training;Gait training;Patient/family education;Manual techniques;Passive range of motion    PT Next Visit Plan  Continue to progress functional strength and LT knee AROM as tolerated. Start with warm up on recumbant bike next visit for ROM. Continue progressing strengthening.    PT Home Exercise Plan  HH has given pt HEP; added squats and sit to stands.    Consulted and Agree with Plan of Care  Patient       Patient will benefit from skilled therapeutic intervention in order to improve the following deficits and impairments:  Abnormal gait, Decreased activity tolerance, Decreased balance, Decreased endurance, Decreased mobility, Decreased range of motion, Decreased strength, Difficulty walking, Increased edema, Increased fascial restricitons, Impaired flexibility, Pain  Visit Diagnosis: Stiffness of left knee, not elsewhere classified  Acute pain of left knee  Muscle weakness (generalized)  Other abnormalities of gait  and mobility     Problem List Patient Active Problem List   Diagnosis Date Noted  . Osteoarthritis of left knee 06/15/2019  . Primary osteoarthritis of left knee 06/04/2019  . A-fib ([facility]) 02/28/2019  . Chronic systolic heart failure ([place]) 02/28/2019   Clarene Critchley PT, DPT 12:04 PM, 07/18/19 [place], [place], [address] Phone: [phone]   Fax:  [phone]  Name: Sara Leach MRN: [id number] Date of Birth: [birth year]

## 2019-07-18 NOTE — Procedures (Signed)
   Sleep Study Report  Patient Information Name: Sara Leach ID: [id number] Birth Date: October 11, 1940  Age: 78  Gender:Female Insurer: BMI: 32.9 (W=209 lb, H=5' 7'') Study Date:07/08/2019 Referring Physician: Pierre Bali, MD  Summary & Diagnosis  TEST DESCRIPTION: Home sleep apnea testing was completed using the WatchPat, a Type 1 device, utilizing peripheral arterial tonometry (PAT), chest movement, actigraphy, pulse oximetry, pulse rate, body position and snore. AHI was calculated with apnea and hypopnea using valid sleep time as the denominator. RDI includes apneas, hypopneas, and RERAs. The data acquired and the scoring of sleep and all associated events were performed in accordance with the recommended standards and specifications as outlined in the AASM Manual for the Scoring of Sleep and Associated Events 2.2.0 (2015).  FINDINGS: 1. Mild obstructive sleep apnea with AHI 8.5/hr. All events occurred in the supine position. 2. No Central sleep apnea. 3. Minimum O2 sat 85% with no nocturnal hypoxemia. 4. Total sleep time 6 hours and 15 minutes. 5. Mild snoring was noted. 6. Shortened sleep onset latency at 6 minutes. 7. Normal REM sleep onset latency at 91 minutes. 8. The patient spent 31% of sleep time in REM sleep. 9. There were 9 awakenings during the night.  DIAGNOSIS: Mild Obstructive Sleep Apnea (G47.33)   RECOMMENDATIONS: 1. Findings are consistent with mild OSA. For symptomatic mild obstructive sleep apnea, patient preference and compliance impacts efficacious outcomes. Therapeutic options include:   a. The patient may benefit from the use of a nocturnal mandibular repositioning appliance. If that line of therapy is to be pursued the patient should be evaluated by a dentist trained in the treatment of sleep related breathing disorders.   b. An ENT consultation which may be useful for specific causes of obstruction and possible treatment options .   c. Consider  treatment with nasal continuous positive airway pressure ( CPAP). If the patient chooses CPAP therapy, a nocturnal PSG with CPAP titration is recommended. As an alternative, an Auto PAP with pressure range 5-20cm H2O with download is an option.  2. Weight loss may be of benefit in reducing the severity of respiratory events and snoring .  3. Routine follow-up efficacy testing should be performed.  Report prepared by: Signature: Fransico Him Electronically Signed: Jul 18, 2019

## 2019-07-19 ENCOUNTER — Telehealth (HOSPITAL_COMMUNITY): Payer: Self-pay | Admitting: Emergency Medicine

## 2019-07-19 NOTE — Telephone Encounter (Signed)
Left message on voicemail with name and callback number Dereon Corkery RN Navigator Card[facility] [phone] Office [phone] Cell  

## 2019-07-20 ENCOUNTER — Encounter (HOSPITAL_COMMUNITY): Payer: Self-pay | Admitting: Physical Therapy

## 2019-07-20 ENCOUNTER — Ambulatory Visit (HOSPITAL_COMMUNITY): Payer: Medicare Other | Admitting: Physical Therapy

## 2019-07-20 ENCOUNTER — Other Ambulatory Visit: Payer: Self-pay

## 2019-07-20 ENCOUNTER — Ambulatory Visit (HOSPITAL_COMMUNITY)
Admission: RE | Admit: 2019-07-20 | Discharge: 2019-07-20 | Disposition: A | Discharge status: Home / Self Care | Payer: Medicare Other | Source: Ambulatory Visit | Attending: Internal Medicine | Admitting: Internal Medicine

## 2019-07-20 DIAGNOSIS — I5022 Chronic systolic (congestive) heart failure: Secondary | ICD-10-CM | POA: Diagnosis not present

## 2019-07-20 DIAGNOSIS — M6281 Muscle weakness (generalized): Secondary | ICD-10-CM

## 2019-07-20 DIAGNOSIS — R2689 Other abnormalities of gait and mobility: Secondary | ICD-10-CM

## 2019-07-20 DIAGNOSIS — M25562 Pain in left knee: Secondary | ICD-10-CM

## 2019-07-20 DIAGNOSIS — M25662 Stiffness of left knee, not elsewhere classified: Secondary | ICD-10-CM | POA: Diagnosis not present

## 2019-07-20 MED ORDER — GADOBUTROL 1 MMOL/ML IV SOLN
10.0000 mL | Freq: Once | INTRAVENOUS | Status: AC | PRN
  Administered 2019-07-20: 10 mL via INTRAVENOUS

## 2019-07-20 NOTE — Therapy (Signed)
[place], [place], [address] Phone: [phone]   Fax:  [phone]  Physical Therapy Treatment  Patient Details  Name: Sara Leach MRN: [id number] Date of Birth: [birth year] Referring Provider (PT): Elizabeth Sauer   Encounter Date: 07/20/2019  PT End of Session - 07/20/19 [id number]    Visit Number  8    Number of Visits  18    Date for PT Re-Evaluation  08/14/19    Authorization Type  [employer] medicare    Authorization - Visit Number  8    Authorization - Number of Visits  10    PT Start Time  [id number]    PT Stop Time  [id number]   Time on bike unbilled   PT Time Calculation (min)  43 min    Equipment Utilized During Treatment  Other (comment)   SPC   Activity Tolerance  Patient tolerated treatment well;No increased pain    Behavior During Therapy  WFL for tasks assessed/performed       Past Medical History:  Diagnosis Date  . Atrial fibrillation ([facility]) 01/2019  . Breast cancer ([facility])   . Cataracts, bilateral   . GERD (gastroesophageal reflux disease)   . Hyperlipidemia   . Hypertension   . Hyperthyroidism   . Obesity   . Osteoarthritis   . Systolic heart failure ([facility]) 01/2019   EF 40% by echo done 01/17/2019    Past Surgical History:  Procedure Laterality Date  . BREAST LUMPECTOMY  07/09/2008  . MASTECTOMY, PARTIAL  07/09/2008  . REPLACEMENT TOTAL KNEE Right 2007  . RIGHT/LEFT HEART CATH AND CORONARY ANGIOGRAPHY N/A 03/07/2019   Procedure: RIGHT/LEFT HEART CATH AND CORONARY ANGIOGRAPHY;  Surgeon: Jolaine Artist, MD;  Location: [facility];  Service: Cardiovascular;  Laterality: N/A;  . TOTAL KNEE ARTHROPLASTY Left 06/15/2019   Procedure: TOTAL KNEE ARTHROPLASTY WITH ADDUCTOR CANAL;  Surgeon: Sydnee Cabal, MD;  Location: [facility];  Service: Orthopedics;  Laterality: Left;    There were no vitals filed for this visit.  Subjective Assessment - 07/20/19 1436    Subjective  Patient reported  tenderness, but not pain.    Pertinent History  OA    Limitations  Sitting;Standing;Walking;House hold activities    How long can you sit comfortably?  no problem    How long can you stand comfortably?  5-10 minutes    How long can you walk comfortably?  with walker 2-3 minutes    Patient Stated Goals  To be able to go up her steps to sleep in her bedroom upstairs, To walk for 20 minutes for exercises, ride a stationary bike, to be able to look after her grandchildren, stop pain medication    Currently in Pain?  No/denies                       [facility] Adult PT Treatment/Exercise - 07/20/19 0001      Knee/Hip Exercises: Stretches   Gastroc Stretch  Both;3 reps;30 seconds    Gastroc Stretch Limitations  on slant board      Knee/Hip Exercises: Aerobic   Recumbent Bike  3 min for ROM. Seat 13, level 3.       Knee/Hip Exercises: Seated   Sit to Sand  10 reps;without UE support;2 sets      Knee/Hip Exercises: Supine   Straight Leg Raises  Strengthening;Left;1 set;10 reps      Manual Therapy   Manual Therapy  Edema management    Manual therapy comments  all manual performed separately from other skilled interventions    Edema Management  retrograde massage to LLE, with leg elevated, for improved fluid return                PT Short Term Goals - 07/03/19 1041      PT SHORT TERM GOAL #1   Title  PT Lt knee ROM to be less than 5 to 110 degrees to allow pt to ambulate with more normalized gt and squat easier to pick items off of the floor.    Time  3    Period  Weeks    Status  New    Target Date  07/24/19      PT SHORT TERM GOAL #2   Title  PT LE strength bilaterally to improve 1/2 grade to allow pt to feel confident walking with a cane indoors.    Time  3    Period  Weeks    Status  New      PT SHORT TERM GOAL #3   Title  PT to be able to stand for 20 minutes without resting to begin making easy small meals    Time  3    Period  Weeks    Status  New       PT SHORT TERM GOAL #4   Title  PT to be able to walk for 10 minutes with a cane with no increase of left knee pain.    Time  3    Period  Weeks    Status  New        PT Long Term Goals - 07/03/19 1044      PT LONG TERM GOAL #1   Title  PT LT knee ROM to be less than three to 120 to allow pt to squat  easier to take care of her great grandchildren.    Time  6    Period  Weeks    Status  New    Target Date  08/14/19      PT LONG TERM GOAL #2   Title  PT LE strength bilaterally to be increased one grade to be able to go up and down one flight of steps to be able to sleep in her bedroom.    Time  3    Period  Weeks    Status  New      PT LONG TERM GOAL #3   Title  PT to be able to balance for 10 seconds on each LE to allow pt to feel confident walking outside on uneven ground with a cane.    Time  6    Period  Weeks    Status  New      PT LONG TERM GOAL #4   Title  PT pain in her LT knee to be no greater than a 1/10 to allow pt to be up for an hour at a time without having to sit to be able to watch her great grandchildren and to complete her cooking and housecleaning.    Time  6    Period  Weeks    Status  New            Plan - 07/20/19 1527    Clinical Impression Statement  Progressed strengthening this session. Added straight leg raises with patient requiring cueing to control descent with lowering. Also increased sets with sit to stands this session. Patient  required cueing with hip extension and flexion to improve form. Patient would benefit from continued skilled physical therapy in order to continue progressing towards functional goals.    Examination-Activity Limitations  Bed Mobility;Bathing;Bend;Carry;Dressing;Lift;Locomotion Level;Squat;Stairs;Stand    Examination-Participation Restrictions  Church;Cleaning;Community Activity;Laundry;Meal Prep;Yard Work    Rehab Potential  Good    PT Frequency  3x / week    PT Duration  6 weeks    PT Treatment/Interventions   Therapeutic activities;Therapeutic exercise;Balance training;Functional mobility training;Stair training;Gait training;Patient/family education;Manual techniques;Passive range of motion    PT Next Visit Plan  Continue progressing strengthening. Perform measurements prior to MD visit.    PT Home Exercise Plan  HH has given pt HEP; added squats and sit to stands.    Consulted and Agree with Plan of Care  Patient       Patient will benefit from skilled therapeutic intervention in order to improve the following deficits and impairments:  Abnormal gait, Decreased activity tolerance, Decreased balance, Decreased endurance, Decreased mobility, Decreased range of motion, Decreased strength, Difficulty walking, Increased edema, Increased fascial restricitons, Impaired flexibility, Pain  Visit Diagnosis: Stiffness of left knee, not elsewhere classified  Acute pain of left knee  Muscle weakness (generalized)  Other abnormalities of gait and mobility     Problem List Patient Active Problem List   Diagnosis Date Noted  . Osteoarthritis of left knee 06/15/2019  . Primary osteoarthritis of left knee 06/04/2019  . A-fib ([place]) 02/28/2019  . Chronic systolic heart failure ([place] [facility]) 02/28/2019   Clarene Critchley PT, DPT 3:31 PM, 07/20/19 [place], [place], [address] Phone: [phone]   Fax:  [phone]  Name: Sara Leach MRN: [id number] Date of Birth: 26-May-1941

## 2019-07-23 ENCOUNTER — Encounter (HOSPITAL_COMMUNITY): Payer: Self-pay | Admitting: Physical Therapy

## 2019-07-23 ENCOUNTER — Other Ambulatory Visit: Payer: Self-pay

## 2019-07-23 ENCOUNTER — Ambulatory Visit (HOSPITAL_COMMUNITY): Payer: Medicare Other | Admitting: Physical Therapy

## 2019-07-23 DIAGNOSIS — M25562 Pain in left knee: Secondary | ICD-10-CM

## 2019-07-23 DIAGNOSIS — R2689 Other abnormalities of gait and mobility: Secondary | ICD-10-CM | POA: Diagnosis not present

## 2019-07-23 DIAGNOSIS — M25662 Stiffness of left knee, not elsewhere classified: Secondary | ICD-10-CM | POA: Diagnosis not present

## 2019-07-23 DIAGNOSIS — M6281 Muscle weakness (generalized): Secondary | ICD-10-CM | POA: Diagnosis not present

## 2019-07-23 NOTE — Therapy (Signed)
[place], [place], [address] Phone: [phone]   Fax:  [phone]  Physical Therapy Treatment  Patient Details  Name: Sara Leach MRN: [id number] Date of Birth: 1940-09-20 Referring Provider (PT): Elizabeth Sauer   Encounter Date: 07/23/2019  PT End of Session - 07/23/19 1126    Visit Number  9    Number of Visits  18    Date for PT Re-Evaluation  08/14/19    Authorization Type  [employer] medicare    Authorization - Visit Number  9    Authorization - Number of Visits  10    PT Start Time  1122    PT Stop Time  1201    PT Time Calculation (min)  39 min    Equipment Utilized During Treatment  Other (comment)   SPC   Activity Tolerance  Patient tolerated treatment well;No increased pain    Behavior During Therapy  WFL for tasks assessed/performed       Past Medical History:  Diagnosis Date  . Atrial fibrillation ([place]) 01/2019  . Breast cancer ([place])   . Cataracts, bilateral   . GERD (gastroesophageal reflux disease)   . Hyperlipidemia   . Hypertension   . Hyperthyroidism   . Obesity   . Osteoarthritis   . Systolic heart failure ([place]) 01/2019   EF 40% by echo done 01/17/2019    Past Surgical History:  Procedure Laterality Date  . BREAST LUMPECTOMY  07/09/2008  . MASTECTOMY, PARTIAL  07/09/2008  . REPLACEMENT TOTAL KNEE Right 2007  . RIGHT/LEFT HEART CATH AND CORONARY ANGIOGRAPHY N/A 03/07/2019   Procedure: RIGHT/LEFT HEART CATH AND CORONARY ANGIOGRAPHY;  Surgeon: Jolaine Artist, MD;  Location: [facility];  Service: Cardiovascular;  Laterality: N/A;  . TOTAL KNEE ARTHROPLASTY Left 06/15/2019   Procedure: TOTAL KNEE ARTHROPLASTY WITH ADDUCTOR CANAL;  Surgeon: Sydnee Cabal, MD;  Location: [facility];  Service: Orthopedics;  Laterality: Left;    There were no vitals filed for this visit.  Subjective Assessment - 07/23/19 1125    Subjective  Patient denied any pain this session and stated  that she is doing well.    Pertinent History  OA    Limitations  Sitting;Standing;Walking;House hold activities    How long can you sit comfortably?  no problem    How long can you stand comfortably?  5-10 minutes    How long can you walk comfortably?  with walker 2-3 minutes    Patient Stated Goals  To be able to go up her steps to sleep in her bedroom upstairs, To walk for 20 minutes for exercises, ride a stationary bike, to be able to look after her grandchildren, stop pain medication    Currently in Pain?  No/denies                       [facility] Adult PT Treatment/Exercise - 07/23/19 0001      Ambulation/Gait   Ambulation/Gait  Yes    Ambulation Distance (Feet)  226 Feet    Assistive device  Straight cane    Gait Pattern  Decreased stance time - left;Decreased weight shift to left;Trendelenburg    Ambulation Surface  Level;Indoor    Gait velocity  0.57 m/s      Knee/Hip Exercises: Stretches   Knee: Self-Stretch to increase Flexion  Left;10 seconds    Knee: Self-Stretch Limitations  10x on 12'' step    Gastroc Stretch  Both;3  reps;30 seconds    Gastroc Stretch Limitations  on slant board      Knee/Hip Exercises: Machines for Strengthening   Other Machine  Resisted walking 30# x5 each   Cues to improve control      Knee/Hip Exercises: Standing   Heel Raises  Both;20 reps    Heel Raises Limitations  toe raises x20 both. On incline      Knee/Hip Exercises: Seated   Sit to Sand  10 reps;without UE support;2 sets      Knee/Hip Exercises: Supine   Knee Extension  AROM    Knee Extension Limitations  5   lacking 5   Knee Flexion  AROM    Knee Flexion Limitations  108      Manual Therapy   Manual Therapy  Edema management    Manual therapy comments  all manual performed separately from other skilled interventions    Edema Management  retrograde massage to LLE, with leg elevated, for improved fluid return                PT Short Term Goals - 07/03/19  1041      PT SHORT TERM GOAL #1   Title  PT Lt knee ROM to be less than 5 to 110 degrees to allow pt to ambulate with more normalized gt and squat easier to pick items off of the floor.    Time  3    Period  Weeks    Status  New    Target Date  07/24/19      PT SHORT TERM GOAL #2   Title  PT LE strength bilaterally to improve 1/2 grade to allow pt to feel confident walking with a cane indoors.    Time  3    Period  Weeks    Status  New      PT SHORT TERM GOAL #3   Title  PT to be able to stand for 20 minutes without resting to begin making easy small meals    Time  3    Period  Weeks    Status  New      PT SHORT TERM GOAL #4   Title  PT to be able to walk for 10 minutes with a cane with no increase of left knee pain.    Time  3    Period  Weeks    Status  New        PT Long Term Goals - 07/03/19 1044      PT LONG TERM GOAL #1   Title  PT LT knee ROM to be less than three to 120 to allow pt to squat  easier to take care of her great grandchildren.    Time  6    Period  Weeks    Status  New    Target Date  08/14/19      PT LONG TERM GOAL #2   Title  PT LE strength bilaterally to be increased one grade to be able to go up and down one flight of steps to be able to sleep in her bedroom.    Time  3    Period  Weeks    Status  New      PT LONG TERM GOAL #3   Title  PT to be able to balance for 10 seconds on each LE to allow pt to feel confident walking outside on uneven ground with a cane.    Time  6  Period  Weeks    Status  New      PT LONG TERM GOAL #4   Title  PT pain in her LT knee to be no greater than a 1/10 to allow pt to be up for an hour at a time without having to sit to be able to watch her great grandchildren and to complete her cooking and housecleaning.    Time  6    Period  Weeks    Status  New            Plan - 07/23/19 1126    Clinical Impression Statement  Checked patient's ambulation speed and ROM this session for patient's upcoming MD  appointment. Patient ambulating at gait speed of 0.57 m/s patient ambulating with SPC this session. Added resisted walking this session requiring cueing to perform slow and controlled movement. Patient would benefit from continued skilled physical therapy in order to continue progressing towards functional goals.    Examination-Activity Limitations  Bed Mobility;Bathing;Bend;Carry;Dressing;Lift;Locomotion Level;Squat;Stairs;Stand    Examination-Participation Restrictions  Church;Cleaning;Community Activity;Laundry;Meal Prep;Yard Work    Rehab Potential  Good    PT Frequency  3x / week    PT Duration  6 weeks    PT Treatment/Interventions  Therapeutic activities;Therapeutic exercise;Balance training;Functional mobility training;Stair training;Gait training;Patient/family education;Manual techniques;Passive range of motion    PT Next Visit Plan  Do progress note next session. Continue to progress functional strengthening.    PT Home Exercise Plan  HH has given pt HEP; added squats and sit to stands.    Consulted and Agree with Plan of Care  Patient       Patient will benefit from skilled therapeutic intervention in order to improve the following deficits and impairments:  Abnormal gait, Decreased activity tolerance, Decreased balance, Decreased endurance, Decreased mobility, Decreased range of motion, Decreased strength, Difficulty walking, Increased edema, Increased fascial restricitons, Impaired flexibility, Pain  Visit Diagnosis: Stiffness of left knee, not elsewhere classified  Acute pain of left knee  Muscle weakness (generalized)  Other abnormalities of gait and mobility     Problem List Patient Active Problem List   Diagnosis Date Noted  . Osteoarthritis of left knee 06/15/2019  . Primary osteoarthritis of left knee 06/04/2019  . A-fib ([place]) 02/28/2019  . Chronic systolic heart failure ([place]) 02/28/2019   Clarene Critchley PT, DPT 12:14 PM, 07/23/19 [place], [place], [address] Phone: [phone]   Fax:  [phone]  Name: Sara Leach MRN: [id number] Date of Birth: 06/24/1941

## 2019-07-24 ENCOUNTER — Encounter: Payer: Self-pay | Admitting: Cardiology

## 2019-07-24 ENCOUNTER — Telehealth (INDEPENDENT_AMBULATORY_CARE_PROVIDER_SITE_OTHER): Payer: Medicare Other | Admitting: Cardiology

## 2019-07-24 ENCOUNTER — Telehealth: Payer: Self-pay | Admitting: *Deleted

## 2019-07-24 VITALS — HR 88 | Ht 66.0 in | Wt 204.0 lb

## 2019-07-24 DIAGNOSIS — I10 Essential (primary) hypertension: Secondary | ICD-10-CM

## 2019-07-24 DIAGNOSIS — G4733 Obstructive sleep apnea (adult) (pediatric): Secondary | ICD-10-CM | POA: Diagnosis not present

## 2019-07-24 DIAGNOSIS — I4891 Unspecified atrial fibrillation: Secondary | ICD-10-CM

## 2019-07-24 NOTE — Telephone Encounter (Signed)
Informed patient of sleep study results and patient understanding was verbalized. Patient understands her sleep study showed they have mild sleep apnea - please set up virtual visit to discuss results. Pt is aware and agreeable to her results. Pt has an appt.07/24/19 1:40.

## 2019-07-24 NOTE — Telephone Encounter (Signed)
-----   Message from Sueanne Margarita, MD sent at 07/18/2019  3:55 PM EST ----- Please let patient know that they have mild sleep apnea - please set up virtual visit to discuss results

## 2019-07-24 NOTE — Progress Notes (Signed)
Virtual Visit via Telephone Note   This visit type was conducted due to national recommendations for restrictions regarding the COVID-19 Pandemic (e.g. social distancing) in an effort to limit this patient's exposure and mitigate transmission in our community.  Due to her co-morbid illnesses, this patient is at least at moderate risk for complications without adequate follow up.  This format is felt to be most appropriate for this patient at this time.  All issues noted in this document were discussed and addressed.  A limited physical exam was performed with this format.  Please refer to the patient's chart for her consent to telehealth for [facility].   Evaluation Performed:  Follow-up visit  This visit type was conducted due to national recommendations for restrictions regarding the COVID-19 Pandemic (e.g. social distancing).  This format is felt to be most appropriate for this patient at this time.  All issues noted in this document were discussed and addressed.  No physical exam was performed (except for noted visual exam findings with Video Visits).  Please refer to the patient's chart (MyChart message for video visits and phone note for telephone visits) for the patient's consent to telehealth for [facility].  Date:  07/24/2019   ID:  Sara Leach, Sara Leach November 20, 1940, MRN [id number]  Patient Location:  Home  Provider location:   [place]  PCP:  Curlene Labrum, MD  Cardiologist:  Glori Bickers, MD Electrophysiologist:  None   Chief Complaint:  Snoring, OSA  History of Present Illness:    Sara Leach is a 78 y.o. female who presents via audio/video conferencing for a telehealth visit today.    This is a 78yo female with a hx of persistent atrial fibrillation, HTN, excessive daytime sleepiness and snoring.  She also has a NICM with chronic systolic CHF.  She tells me that her fatigue has improved and she thinks that it was more related to sleeping  poorly due to pain.  She had recently had knee replacement and feels better.  She says that she is still not sleeping as well because of certain positions in bed irritate her leg.  She currently is sleeping in the recliner for comfort.  She has some snoring but has never awakened herself with the snoring.  Her children have told her she snores.  She feels rested when she gest up in the am and never has to nap during the day.    She recently underwent home sleep study which showed very mild OSA.  She is now here to discuss the results and determine a treatment plan going forward.  Her home sleep study showed mild OSA with an AHI of 8.5/hr with all events occurring in the supine position with no central events.  She normally sleeps on her left side but was sleeping on her back the entire time when the sleep study was done while she was in the recliner. The lowest O2 sat noted was 85% but no nocturnal hypoxemia.  There was mild snoring noted.    The patient does not have symptoms concerning for COVID-19 infection (fever, chills, cough, or new shortness of breath).    Prior CV studies:   The following studies were reviewed today:  Home sleep study  Past Medical History:  Diagnosis Date  . Atrial fibrillation ([place]) 01/2019  . Breast cancer ([place])   . Cataracts, bilateral   . GERD (gastroesophageal reflux disease)   . Hyperlipidemia   . Hypertension   . Hyperthyroidism   .  Obesity   . Osteoarthritis   . Systolic heart failure ([place]) 01/2019   EF 40% by echo done 01/17/2019   Past Surgical History:  Procedure Laterality Date  . BREAST LUMPECTOMY  07/09/2008  . MASTECTOMY, PARTIAL  07/09/2008  . REPLACEMENT TOTAL KNEE Right 2007  . RIGHT/LEFT HEART CATH AND CORONARY ANGIOGRAPHY N/A 03/07/2019   Procedure: RIGHT/LEFT HEART CATH AND CORONARY ANGIOGRAPHY;  Surgeon: Jolaine Artist, MD;  Location: [facility];  Service: Cardiovascular;  Laterality: N/A;  . TOTAL KNEE ARTHROPLASTY Left  06/15/2019   Procedure: TOTAL KNEE ARTHROPLASTY WITH ADDUCTOR CANAL;  Surgeon: Sydnee Cabal, MD;  Location: [facility];  Service: Orthopedics;  Laterality: Left;     Current Meds  Medication Sig  . acetaminophen (TYLENOL) 500 MG tablet Take 500 mg by mouth 2 (two) times a day.  Marland Kitchen apixaban (ELIQUIS) 5 MG TABS tablet Take 5 mg by mouth 2 (two) times daily.   . calcium carbonate (OS-CAL) 600 MG tablet Take 600 mg by mouth daily.  Marland Kitchen ENTRESTO 24-26 MG TAKE 1 TABLET BY MOUTH TWICE DAILY.  . Famotidine (PEPCID AC MAXIMUM STRENGTH) 20 MG CHEW Chew 1 tablet by mouth at bedtime.  . furosemide (LASIX) 40 MG tablet Take 1 tablet (40 mg total) by mouth every Monday, Wednesday, and Friday.  . levothyroxine (SYNTHROID) 75 MCG tablet Take 75 mcg by mouth daily before breakfast.  . Liniments (PAIN RELIEF EX) Apply 1 application topically 3 (three) times daily as needed (back pain.). [place] Gel - White Active Ingredients  Camphor 3.00% Menthol 5.00%  . metoprolol succinate (TOPROL-XL) 25 MG 24 hr tablet Take 3 tablets (75 mg total) by mouth at bedtime. (Patient taking differently: Take 25 mg by mouth at bedtime. )  . metoprolol succinate (TOPROL-XL) 50 MG 24 hr tablet Take 50 mg by mouth at bedtime. Take with or immediately following a meal.  . Multiple Vitamins-Minerals (EMERGEN-C VITAMIN C PO) Take by mouth.  . potassium chloride SA (K-DUR) 20 MEQ tablet Take 1 tablet (20 mEq total) by mouth daily.  Marland Kitchen spironolactone (ALDACTONE) 25 MG tablet Take 0.5 tablets (12.5 mg total) by mouth daily.     Allergies:   Patient has no known allergies.   Social History   Tobacco Use  . Smoking status: Never Smoker  . Smokeless tobacco: Never Used  Substance Use Topics  . Alcohol use: Yes    Comment: occasionally has wine  . Drug use: Never     Family Hx: The patient's family history includes AAA (abdominal aortic aneurysm) (age of onset: 65) in her father; Alzheimer's disease in her brother, brother, brother, and  sister; Coronary artery disease in her father; Heart defect in her son; Ovarian cancer in her mother.  ROS:   Please see the history of present illness.     All other systems reviewed and are negative.   Labs/Other Tests and Data Reviewed:    Recent Labs: 02/28/2019: ALT 21 06/06/2019: B Natriuretic Peptide 172.3; BUN 33; Creatinine, Ser 1.24; Hemoglobin 14.1; Platelets 303; Potassium 4.4; Sodium 131   Recent Lipid Panel No results found for: CHOL, TRIG, HDL, CHOLHDL, LDLCALC, LDLDIRECT  Wt Readings from Last 3 Encounters:  07/24/19 204 lb (92.5 kg)  06/15/19 206 lb 9.6 oz (93.7 kg)  06/12/19 206 lb 9.6 oz (93.7 kg)     Objective:    Vital Signs:  Pulse 88   Ht 5\' 6"  (1.676 m)   Wt 204 lb (92.5 kg)   BMI 32.93  kg/m     ASSESSMENT & PLAN:    1.  OSA -this is very mild with an AHI of 8.5/hr and no significant hypoxemia -most of her events occurred in the supine position -treatment options were discussed including a trial of CPAP therapy, referral to ENT to evaluate for surgical causes of OSA, positional therapy as most events occur supine or an oral device.  -Since her apneas occur mainly in the supine position and her study was done while in the supine position in a recliner due to her recent knee surgery, I have recommended repeating the study once back in her bed since she sleeps only on her side when in bed.  We will repeat the [place] study in late January.  2.  HTN -BP controlled -continue Toprol, Entresto, spiro  3.  PAF -she has not had any palpitations recently -continue on apixaban 5mg  BID and Toprol XL 50mg  daily  COVID-19 Education: The signs and symptoms of COVID-19 were discussed with the patient and how to seek care for testing (follow up with PCP or arrange E-visit).  The importance of social distancing was discussed today.  Patient Risk:   After full review of this patient's clinical status, I feel that they are at least moderate risk at this  time.  Time:   Today, I have spent 20 minutes directly with the patient on telemedicine discussing medical problems including OSA, PAF, HTn.  We also reviewed the symptoms of COVID 19 and the ways to protect against contracting the virus with telehealth technology.  I spent an additional 5 minutes reviewing patient's chart including home sleep study.  Medication Adjustments/Labs and Tests Ordered: Current medicines are reviewed at length with the patient today.  Concerns regarding medicines are outlined above.  Tests Ordered: No orders of the defined types were placed in this encounter.  Medication Changes: No orders of the defined types were placed in this encounter.   Disposition:  Follow up prn  Signed, Fransico Him, MD  07/24/2019 1:46 PM    [place]

## 2019-07-24 NOTE — Patient Instructions (Signed)
Medication Instructions:  Your physician recommends that you continue on your current medications as directed. Please refer to the Current Medication list given to you today.  *If you need a refill on your cardiac medications before your next appointment, please call your pharmacy*  Follow-Up: At [facility], you and your health needs are our priority.  As part of our continuing mission to provide you with exceptional heart care, we have created designated Provider Care Teams.  These Care Teams include your primary Cardiologist (physician) and Advanced Practice Providers (APPs -  Physician Assistants and Nurse Practitioners) who all work together to provide you with the care you need, when you need it.  Follow up with Dr. Turner as needed 

## 2019-07-25 ENCOUNTER — Other Ambulatory Visit: Payer: Self-pay

## 2019-07-25 ENCOUNTER — Ambulatory Visit (HOSPITAL_COMMUNITY): Payer: Medicare Other | Admitting: Physical Therapy

## 2019-07-25 ENCOUNTER — Encounter (HOSPITAL_COMMUNITY): Payer: Self-pay | Admitting: Physical Therapy

## 2019-07-25 DIAGNOSIS — M25662 Stiffness of left knee, not elsewhere classified: Secondary | ICD-10-CM | POA: Diagnosis not present

## 2019-07-25 DIAGNOSIS — M6281 Muscle weakness (generalized): Secondary | ICD-10-CM | POA: Diagnosis not present

## 2019-07-25 DIAGNOSIS — R2689 Other abnormalities of gait and mobility: Secondary | ICD-10-CM

## 2019-07-25 DIAGNOSIS — M25562 Pain in left knee: Secondary | ICD-10-CM

## 2019-07-25 NOTE — Therapy (Signed)
[facility] [address] [place], [place], [address] Phone: [phone]   Fax:  [phone]  Physical Therapy Treatment/ Progress Note  Patient Details  Name: Sara Leach MRN: [id number] Date of Birth: [birth year] Referring Provider (PT): Elizabeth Sauer   Encounter Date: 07/25/2019   Progress Note Reporting Period 07/03/19 to 07/25/19  See note below for Objective Data and Assessment of Progress/Goals.       PT End of Session - 07/25/19 1030    Visit Number  10    Number of Visits  18    Date for PT Re-Evaluation  08/14/19   Progress note done 12/16   Authorization Type  BCBS medicare    Authorization - Visit Number  1    Authorization - Number of Visits  10    PT Start Time  1030    PT Stop Time  1120    PT Time Calculation (min)  50 min    Equipment Utilized During Treatment  Other (comment)   SPC   Activity Tolerance  Patient tolerated treatment well;No increased pain    Behavior During Therapy  WFL for tasks assessed/performed       Past Medical History:  Diagnosis Date  . Atrial fibrillation ([place]) 01/2019  . Breast cancer ([place])   . Cataracts, bilateral   . GERD (gastroesophageal reflux disease)   . Hyperlipidemia   . Hypertension   . Hyperthyroidism   . Obesity   . Osteoarthritis   . Systolic heart failure ([place]) 01/2019   EF 40% by echo done 01/17/2019    Past Surgical History:  Procedure Laterality Date  . BREAST LUMPECTOMY  07/09/2008  . MASTECTOMY, PARTIAL  07/09/2008  . REPLACEMENT TOTAL KNEE Right 2007  . RIGHT/LEFT HEART CATH AND CORONARY ANGIOGRAPHY N/A 03/07/2019   Procedure: RIGHT/LEFT HEART CATH AND CORONARY ANGIOGRAPHY;  Surgeon: Jolaine Artist, MD;  Location: [facility];  Service: Cardiovascular;  Laterality: N/A;  . TOTAL KNEE ARTHROPLASTY Left 06/15/2019   Procedure: TOTAL KNEE ARTHROPLASTY WITH ADDUCTOR CANAL;  Surgeon: Sydnee Cabal, MD;  Location: [facility];  Service:  Orthopedics;  Laterality: Left;    There were no vitals filed for this visit.  Subjective Assessment - 07/25/19 1032    Subjective  Patient says she is feeling good and had been able to stand in her kitchen and cook recently. Patient says she had follow up with MD recently and had a good report. Patient denies pain currently.    Pertinent History  OA    Limitations  Sitting;Standing;Walking;House hold activities    How long can you sit comfortably?  no problem    How long can you stand comfortably?  10-15 minutes    How long can you walk comfortably?  10-15 minutes    Patient Stated Goals  To be able to go up her steps to sleep in her bedroom upstairs, To walk for 20 minutes for exercises, ride a stationary bike, to be able to look after her grandchildren, stop pain medication    Currently in Pain?  No/denies         [facility] PT Assessment - 07/25/19 0001      Assessment   Medical Diagnosis  LT TKR    Referring Provider (PT)  Margarita Mail Haus    Onset Date/Surgical Date  06/15/19    Next MD Visit  09/03/19    Prior Therapy  HH; ended 11/20      Precautions   Precautions  None      Restrictions   Weight Bearing Restrictions  No      Balance Screen   Has the patient fallen in the past 6 months  No      [place] residence      Prior Function   Level of Independence  Independent      Cognition   Overall Cognitive Status  Within Functional Limits for tasks assessed      Observation/Other Assessments   Focus on Therapeutic Outcomes (FOTO)   37% limited   was 59%     AROM   Left Knee Extension  -8   was -10   Left Knee Flexion  109   was 94     Strength   Right Hip Flexion  4/5   was 3   Right Hip Extension  3/5   was 2+   Right Hip ABduction  3+/5   was 2+   Left Hip Flexion  4/5   was 3   Left Hip Extension  3/5   was 2+   Left Hip ABduction  3-/5   was 2   Right Knee Flexion  5/5    Right Knee Extension  5/5    Left Knee  Flexion  4+/5   was 3   Left Knee Extension  4+/5   was 3+   Right Ankle Dorsiflexion  5/5   was 3+   Left Ankle Dorsiflexion  5/5   was 3+     Ambulation/Gait   Ambulation/Gait  Yes    Ambulation/Gait Assistance  6: Modified independent (Device/Increase time)    Ambulation Distance (Feet)  250 Feet    Assistive device  Straight cane    Gait Pattern  Decreased stance time - left;Decreased weight shift to left;Trendelenburg    Ambulation Surface  Level;Indoor    Gait Comments  2MWT      Balance   Balance Assessed  Yes      Static Standing Balance   Static Standing Balance -  Activities   Single Leg Stance - Right Leg;Single Leg Stance - Left Leg    Static Standing - Comment/# of Minutes  able to hold 10 sec bilaterally but with finger touch assist x1                   [facility] Adult PT Treatment/Exercise - 07/25/19 0001      Manual Therapy   Manual Therapy  Edema management;Joint mobilization    Manual therapy comments  all manual performed separately from other skilled interventions    Edema Management  retrograde massage to LLE, with leg elevated, for improved fluid return     Joint Mobilization  Patellar glides grade II in all planes for improved mobility               PT Education - 07/25/19 1036    Education Details  Patient educated on reassessment findings and POC    Person(s) Educated  Patient    Methods  Explanation    Comprehension  Verbalized understanding       PT Short Term Goals - 07/25/19 1115      PT SHORT TERM GOAL #1   Title  PT Lt knee ROM to be less than 5 to 110 degrees to allow pt to ambulate with more normalized gt and squat easier to pick items off of the floor.    Time  3    Period  Weeks    Status  On-going    Target Date  07/24/19      PT SHORT TERM GOAL #2   Title  PT LE strength bilaterally to improve 1/2 grade to allow pt to feel confident walking with a cane indoors.    Time  3    Period  Weeks    Status  Achieved       PT SHORT TERM GOAL #3   Title  PT to be able to stand for 20 minutes without resting to begin making easy small meals    Time  3    Period  Weeks    Status  On-going      PT SHORT TERM GOAL #4   Title  PT to be able to walk for 10 minutes with a cane with no increase of left knee pain.    Time  3    Period  Weeks    Status  Achieved        PT Long Term Goals - 07/25/19 1116      PT LONG TERM GOAL #1   Title  PT LT knee ROM to be less than three to 120 to allow pt to squat  easier to take care of her great grandchildren.    Time  6    Period  Weeks    Status  On-going      PT LONG TERM GOAL #2   Title  PT LE strength bilaterally to be increased one grade to be able to go up and down one flight of steps to be able to sleep in her bedroom.    Time  3    Period  Weeks    Status  On-going      PT LONG TERM GOAL #3   Title  PT to be able to balance for 10 seconds on each LE to allow pt to feel confident walking outside on uneven ground with a cane.    Time  6    Period  Weeks    Status  On-going      PT LONG TERM GOAL #4   Title  PT pain in her LT knee to be no greater than a 1/10 to allow pt to be up for an hour at a time without having to sit to be able to watch her great grandchildren and to complete her cooking and housecleaning.    Time  6    Period  Weeks    Status  On-going            Plan - 07/25/19 1126    Clinical Impression Statement  Patient is progressing well to LTGs. Patient shows good improvement in LT knee strength but continues to be limited in hip strength. Patient with good progress on LT knee flexion AROM, but continues to be limited in extension. Patient still challenged with static balance, but is able to perform SLS with finger touch assist x1. Patient demos improved gait and is able to comfortably walk using single point cane, but still with noted gait deficits and is limited in distance by onset of fatigue. Patient will continue to benefit  form skilled therapy services to address these remaining deficits to reduce pain and improve level of function with ADLs and functional mobility.    Examination-Activity Limitations  Bed Mobility;Bathing;Bend;Carry;Dressing;Lift;Locomotion Level;Squat;Stairs;Stand    Examination-Participation Restrictions  Church;Cleaning;Community Activity;Laundry;Meal Prep;Yard Work    Stability/Clinical Decision Making  Stable/Uncomplicated    Rehab Potential  Good  PT Frequency  3x / week    PT Duration  3 weeks    PT Treatment/Interventions  Therapeutic activities;Therapeutic exercise;Balance training;Functional mobility training;Stair training;Gait training;Patient/family education;Manual techniques;Passive range of motion    PT Next Visit Plan  Continue to progress gait, LT knee extension and LT hip strengthening as tolerated. Add band TKEs next visit.    PT Home Exercise Plan  HH has given pt HEP; added squats and sit to stands.    Consulted and Agree with Plan of Care  Patient       Patient will benefit from skilled therapeutic intervention in order to improve the following deficits and impairments:  Abnormal gait, Decreased activity tolerance, Decreased balance, Decreased endurance, Decreased mobility, Decreased range of motion, Decreased strength, Difficulty walking, Increased edema, Increased fascial restricitons, Impaired flexibility, Pain  Visit Diagnosis: Stiffness of left knee, not elsewhere classified  Acute pain of left knee  Muscle weakness (generalized)  Other abnormalities of gait and mobility     Problem List Patient Active Problem List   Diagnosis Date Noted  . Osteoarthritis of left knee 06/15/2019  . Primary osteoarthritis of left knee 06/04/2019  . A-fib ([place]) 02/28/2019  . Chronic systolic heart failure ([place]) 02/28/2019   11:36 AM, 07/25/19 Josue Hector PT DPT  Physical Therapist with [facility]  [phone] [place] [address] [place], [place], [address] Phone: [phone]   Fax:  [phone]  Name: Sara Leach MRN: [id number] Date of Birth: 12/24/1940

## 2019-07-27 ENCOUNTER — Other Ambulatory Visit: Payer: Self-pay

## 2019-07-27 ENCOUNTER — Ambulatory Visit (HOSPITAL_COMMUNITY): Payer: Medicare Other

## 2019-07-27 ENCOUNTER — Encounter (HOSPITAL_COMMUNITY): Payer: Self-pay

## 2019-07-27 DIAGNOSIS — M25562 Pain in left knee: Secondary | ICD-10-CM

## 2019-07-27 DIAGNOSIS — M6281 Muscle weakness (generalized): Secondary | ICD-10-CM

## 2019-07-27 DIAGNOSIS — M25662 Stiffness of left knee, not elsewhere classified: Secondary | ICD-10-CM | POA: Diagnosis not present

## 2019-07-27 DIAGNOSIS — R2689 Other abnormalities of gait and mobility: Secondary | ICD-10-CM | POA: Diagnosis not present

## 2019-07-27 NOTE — Therapy (Signed)
Sara Leach, [place], [address] Phone: [phone]   Fax:  [phone]  Physical Therapy Treatment  Patient Details  Name: Sara Leach MRN: [id number] Date of Birth: 07/07/1941 Referring Provider (PT): Elizabeth Sauer   Encounter Date: 07/27/2019  PT End of Session - 07/27/19 1843    Visit Number  11    Number of Visits  18    Date for PT Re-Evaluation  08/14/19   Progress note complete visit #10; 07/25/19   Authorization Type  BCBS medicare    Authorization Time Period  07/03/19-->08/14/19; Progress note complete visit #10; 07/25/19    Authorization - Visit Number  2    Authorization - Number of Visits  10    PT Start Time  [id number]    PT Stop Time  1832    PT Time Calculation (min)  43 min    Equipment Utilized During Treatment  Other (comment)   hurrycane   Activity Tolerance  Patient tolerated treatment well;No increased pain    Behavior During Therapy  WFL for tasks assessed/performed       Past Medical History:  Diagnosis Date  . Atrial fibrillation ([place]) 01/2019  . Breast cancer ([place])   . Cataracts, bilateral   . GERD (gastroesophageal reflux disease)   . Hyperlipidemia   . Hypertension   . Hyperthyroidism   . Obesity   . Osteoarthritis   . Systolic heart failure ([place]) 01/2019   EF 40% by echo done 01/17/2019    Past Surgical History:  Procedure Laterality Date  . BREAST LUMPECTOMY  07/09/2008  . MASTECTOMY, PARTIAL  07/09/2008  . REPLACEMENT TOTAL KNEE Right 2007  . RIGHT/LEFT HEART CATH AND CORONARY ANGIOGRAPHY N/A 03/07/2019   Procedure: RIGHT/LEFT HEART CATH AND CORONARY ANGIOGRAPHY;  Surgeon: Jolaine Artist, MD;  Location: [facility];  Service: Cardiovascular;  Laterality: N/A;  . TOTAL KNEE ARTHROPLASTY Left 06/15/2019   Procedure: TOTAL KNEE ARTHROPLASTY WITH ADDUCTOR CANAL;  Surgeon: Sydnee Cabal, MD;  Location: [facility];  Service: Orthopedics;  Laterality: Left;     There were no vitals filed for this visit.  Subjective Assessment - 07/27/19 1754    Subjective  Pt arrived with SPC.  No reoprts of pain currently.    Pertinent History  OA    Patient Stated Goals  To be able to go up her steps to sleep in her bedroom upstairs, To walk for 20 minutes for exercises, ride a stationary bike, to be able to look after her grandchildren, stop pain medication    Currently in Pain?  No/denies                       [facility] Adult PT Treatment/Exercise - 07/27/19 0001      Ambulation/Gait   Ambulation/Gait  Yes    Ambulation/Gait Assistance  6: Modified independent (Device/Increase time)    Ambulation Distance (Feet)  226 Feet    Assistive device  Straight cane    Gait Pattern  Decreased stance time - left;Decreased weight shift to left;Trendelenburg    Ambulation Surface  Level;Indoor    Gait Comments  good mechanics      Exercises   Exercises  Knee/Hip      Knee/Hip Exercises: Stretches   Knee: Self-Stretch to increase Flexion  Left;10 seconds    Knee: Self-Stretch Limitations  10x on 12'' step    Gastroc Stretch  Both;3 reps;30 seconds    Gastroc  Stretch Limitations  on slant board      Knee/Hip Exercises: Standing   Heel Raises  Both;20 reps    Heel Raises Limitations  toe raises x20 both. On incline    Hip Abduction  Stengthening;Both;10 reps;Knee straight    Abduction Limitations  RTB    Hip Extension  Stengthening;Both;10 reps;Knee straight    Extension Limitations  RTB    Lateral Step Up  Left;15 reps;Hand Hold: 2;Step Height: 6"    Forward Step Up  Left;15 reps;Hand Hold: 2;Step Height: 6"    Step Down  Left;10 reps;Hand Hold: 2;Step Height: 6"    Gait Training  --    Other Standing Knee Exercises  Sidestepping in // bars x3 RT no HHA      Knee/Hip Exercises: Seated   Sit to Sand  10 reps;without UE support;2 sets      Knee/Hip Exercises: Supine   Knee Extension  AROM    Knee Extension Limitations  lacking 4    Knee  Flexion  AROM    Knee Flexion Limitations  112      Manual Therapy   Manual Therapy  Edema management    Manual therapy comments  all manual performed separately from other skilled interventions    Edema Management  retrograde massage to LLE, with leg elevated, for improved fluid return                PT Short Term Goals - 07/25/19 1115      PT SHORT TERM GOAL #1   Title  PT Lt knee ROM to be less than 5 to 110 degrees to allow pt to ambulate with more normalized gt and squat easier to pick items off of the floor.    Time  3    Period  Weeks    Status  On-going    Target Date  07/24/19      PT SHORT TERM GOAL #2   Title  PT LE strength bilaterally to improve 1/2 grade to allow pt to feel confident walking with a cane indoors.    Time  3    Period  Weeks    Status  Achieved      PT SHORT TERM GOAL #3   Title  PT to be able to stand for 20 minutes without resting to begin making easy small meals    Time  3    Period  Weeks    Status  On-going      PT SHORT TERM GOAL #4   Title  PT to be able to walk for 10 minutes with a cane with no increase of left knee pain.    Time  3    Period  Weeks    Status  Achieved        PT Long Term Goals - 07/25/19 1116      PT LONG TERM GOAL #1   Title  PT LT knee ROM to be less than three to 120 to allow pt to squat  easier to take care of her great grandchildren.    Time  6    Period  Weeks    Status  On-going      PT LONG TERM GOAL #2   Title  PT LE strength bilaterally to be increased one grade to be able to go up and down one flight of steps to be able to sleep in her bedroom.    Time  3    Period  Weeks  Status  On-going      PT LONG TERM GOAL #3   Title  PT to be able to balance for 10 seconds on each LE to allow pt to feel confident walking outside on uneven ground with a cane.    Time  6    Period  Weeks    Status  On-going      PT LONG TERM GOAL #4   Title  PT pain in her LT knee to be no greater than a 1/10  to allow pt to be up for an hour at a time without having to sit to be able to watch her great grandchildren and to complete her cooking and housecleaning.    Time  6    Period  Weeks    Status  On-going            Plan - 07/27/19 1845    Clinical Impression Statement  Pt progressing well towards POC.  PT arrived wiht SPC/hurrycane and good gait mechanics noted with no LOB.  Added TKE for to improve extension with ROM and continues with functional strengthening exercises.  Pt able to complete whole session with no reports of pain, easily fatigue required standing rest breaks between exercises.  AROM 4-112 degrees (was 5-109 lasst session.)    Examination-Activity Limitations  Bed Mobility;Bathing;Bend;Carry;Dressing;Lift;Locomotion Level;Squat;Stairs;Stand    Examination-Participation Restrictions  Church;Cleaning;Community Activity;Laundry;Meal Prep;Yard Work    Stability/Clinical Decision Making  Stable/Uncomplicated    Clinical Decision Making  Low    Rehab Potential  Good    PT Frequency  3x / week    PT Duration  3 weeks    PT Treatment/Interventions  Therapeutic activities;Therapeutic exercise;Balance training;Functional mobility training;Stair training;Gait training;Patient/family education;Manual techniques;Passive range of motion    PT Next Visit Plan  Continue with ROM based exercises.  Progress to stair training next session for reciprocal movements, resume balance SLS and tandem stance.    PT Home Exercise Plan  HH has given pt HEP; added squats and sit to stands.       Patient will benefit from skilled therapeutic intervention in order to improve the following deficits and impairments:  Abnormal gait, Decreased activity tolerance, Decreased balance, Decreased endurance, Decreased mobility, Decreased range of motion, Decreased strength, Difficulty walking, Increased edema, Increased fascial restricitons, Impaired flexibility, Pain  Visit Diagnosis: Acute pain of left  knee  Muscle weakness (generalized)  Other abnormalities of gait and mobility  Stiffness of left knee, not elsewhere classified     Problem List Patient Active Problem List   Diagnosis Date Noted  . Osteoarthritis of left knee 06/15/2019  . Primary osteoarthritis of left knee 06/04/2019  . A-fib ([place]) 02/28/2019  . Chronic systolic heart failure [facility]) 02/28/2019   Ihor Austin, LPTA; CBIS [phone]  Aldona Lento 07/27/2019, 6:50 PM  [facility] [place], [place], [address] Phone: [phone]   Fax:  [phone]  Name: Sara Leach MRN: [id number] Date of Birth: 1941/04/03

## 2019-07-30 ENCOUNTER — Other Ambulatory Visit: Payer: Self-pay

## 2019-07-30 ENCOUNTER — Ambulatory Visit (HOSPITAL_COMMUNITY): Payer: Medicare Other | Admitting: Physical Therapy

## 2019-07-30 ENCOUNTER — Encounter (HOSPITAL_COMMUNITY): Payer: Self-pay | Admitting: Physical Therapy

## 2019-07-30 DIAGNOSIS — M25662 Stiffness of left knee, not elsewhere classified: Secondary | ICD-10-CM

## 2019-07-30 DIAGNOSIS — M25562 Pain in left knee: Secondary | ICD-10-CM | POA: Diagnosis not present

## 2019-07-30 DIAGNOSIS — R2689 Other abnormalities of gait and mobility: Secondary | ICD-10-CM

## 2019-07-30 DIAGNOSIS — M6281 Muscle weakness (generalized): Secondary | ICD-10-CM | POA: Diagnosis not present

## 2019-07-30 NOTE — Telephone Encounter (Signed)
itamar sleep study Freada Bergeron, CMA  Freada Bergeron, CMA  We will repeat the Itamar study in late January.

## 2019-07-30 NOTE — Therapy (Signed)
[facility], [place], [address] Phone: [phone]   Fax:  [phone]  Physical Therapy Treatment  Patient Details  Name: Sara Leach MRN: [id number] Date of Birth: 1941-02-25 Referring Provider (PT): Elizabeth Sauer   Encounter Date: 07/30/2019  PT End of Session - 07/30/19 1024    Visit Number  12    Number of Visits  18    Date for PT Re-Evaluation  08/14/19   Progress note complete visit #10; 07/25/19   Authorization Type  BCBS medicare    Authorization Time Period  07/03/19-->08/14/19; Progress note complete visit #10; 07/25/19    Authorization - Visit Number  3    Authorization - Number of Visits  10    PT Start Time  0915    PT Stop Time  1003    PT Time Calculation (min)  48 min    Equipment Utilized During Treatment  Other (comment)   [email or web address]   Activity Tolerance  Patient tolerated treatment well;No increased pain    Behavior During Therapy  WFL for tasks assessed/performed       Past Medical History:  Diagnosis Date  . Atrial fibrillation ([place]) 01/2019  . Breast cancer ([place])   . Cataracts, bilateral   . GERD (gastroesophageal reflux disease)   . Hyperlipidemia   . Hypertension   . Hyperthyroidism   . Obesity   . Osteoarthritis   . Systolic heart failure ([facility]) 01/2019   EF 40% by echo done 01/17/2019    Past Surgical History:  Procedure Laterality Date  . BREAST LUMPECTOMY  07/09/2008  . MASTECTOMY, PARTIAL  07/09/2008  . REPLACEMENT TOTAL KNEE Right 2007  . RIGHT/LEFT HEART CATH AND CORONARY ANGIOGRAPHY N/A 03/07/2019   Procedure: RIGHT/LEFT HEART CATH AND CORONARY ANGIOGRAPHY;  Surgeon: Jolaine Artist, MD;  Location: [facility];  Service: Cardiovascular;  Laterality: N/A;  . TOTAL KNEE ARTHROPLASTY Left 06/15/2019   Procedure: TOTAL KNEE ARTHROPLASTY WITH ADDUCTOR CANAL;  Surgeon: Sydnee Cabal, MD;  Location: [facility];  Service: Orthopedics;  Laterality: Left;     There were no vitals filed for this visit.  Subjective Assessment - 07/30/19 0920    Subjective  Pt did a lot of baking over the weekend. on her leg for several hours at a time.    Pertinent History  OA    How long can you stand comfortably?  several hours    Patient Stated Goals  To be able to go up her steps to sleep in her bedroom upstairs, To walk for 20 minutes for exercises, ride a stationary bike, to be able to look after her grandchildren, stop pain medication    Currently in Pain?  No/denies              [facility] Adult PT Treatment/Exercise - 07/30/19 0001      Exercises   Exercises  Knee/Hip      Knee/Hip Exercises: Stretches   Passive Hamstring Stretch  Left;3 reps;30 seconds    Knee: Self-Stretch to increase Flexion  Left;3 reps;30 seconds    Knee: Self-Stretch Limitations  12" step     Gastroc Stretch  Both;3 reps;30 seconds    Gastroc Stretch Limitations  on slant board      Knee/Hip Exercises: Standing   Heel Raises  Both;15 reps    Knee Flexion  Left;10 reps    Terminal Knee Extension  Strengthening;Left;10 reps      Knee/Hip Exercises: Seated  Long Arc [employer]  Left;10 reps      Knee/Hip Exercises: Supine   [employer]  10 reps    Short Arc Quad Sets  Left;10 reps    Heel Slides  Left;10 reps    Knee Extension Limitations  2    Knee Flexion Limitations  118      Knee/Hip Exercises: Prone   Hamstring Curl  10 reps    Hip Extension  Both;10 reps    Contract/Relax to Increase Flexion  x5    Other Prone Exercises  terminal extension and flexion x 10      Manual Therapy   Manual Therapy  Edema management    Manual therapy comments  all manual performed separately from other skilled interventions    Edema Management  decongestive techniques                PT Short Term Goals - 07/25/19 1115      PT SHORT TERM GOAL #1   Title  PT Lt knee ROM to be less than 5 to 110 degrees to allow pt to ambulate with more normalized gt and squat easier to  pick items off of the floor.    Time  3    Period  Weeks    Status  On-going    Target Date  07/24/19      PT SHORT TERM GOAL #2   Title  PT LE strength bilaterally to improve 1/2 grade to allow pt to feel confident walking with a cane indoors.    Time  3    Period  Weeks    Status  Achieved      PT SHORT TERM GOAL #3   Title  PT to be able to stand for 20 minutes without resting to begin making easy small meals    Time  3    Period  Weeks    Status  On-going      PT SHORT TERM GOAL #4   Title  PT to be able to walk for 10 minutes with a cane with no increase of left knee pain.    Time  3    Period  Weeks    Status  Achieved        PT Long Term Goals - 07/25/19 1116      PT LONG TERM GOAL #1   Title  PT LT knee ROM to be less than three to 120 to allow pt to squat  easier to take care of her great grandchildren.    Time  6    Period  Weeks    Status  On-going      PT LONG TERM GOAL #2   Title  PT LE strength bilaterally to be increased one grade to be able to go up and down one flight of steps to be able to sleep in her bedroom.    Time  3    Period  Weeks    Status  On-going      PT LONG TERM GOAL #3   Title  PT to be able to balance for 10 seconds on each LE to allow pt to feel confident walking outside on uneven ground with a cane.    Time  6    Period  Weeks    Status  On-going      PT LONG TERM GOAL #4   Title  PT pain in her LT knee to be no greater than a 1/10 to allow  pt to be up for an hour at a time without having to sit to be able to watch her great grandchildren and to complete her cooking and housecleaning.    Time  6    Period  Weeks    Status  On-going            Plan - 07/30/19 1025    Clinical Impression Statement  PT AROM continute to improve with AROM 2-118 after prone exercises.  Added prone exercises to attempt to isolate and strengthen hamstring and gluteal maximus mm as pt tends to use substitutional patterns when standing.  Noted  significant weakness in gluteal mm bilaterally.    Examination-Activity Limitations  Bed Mobility;Bathing;Bend;Carry;Dressing;Lift;Locomotion Level;Squat;Stairs;Stand    Examination-Participation Restrictions  Church;Cleaning;Community Activity;Laundry;Meal Prep;Yard Work    Stability/Clinical Decision Making  Stable/Uncomplicated    Rehab Potential  Good    PT Frequency  3x / week    PT Duration  3 weeks    PT Treatment/Interventions  Therapeutic activities;Therapeutic exercise;Balance training;Functional mobility training;Stair training;Gait training;Patient/family education;Manual techniques;Passive range of motion    PT Next Visit Plan  Continue with ROM based exercises.  Progress to stair training next session for reciprocal movements, resume balance SLS and tandem stance.    PT Home Exercise Plan  HH has given pt HEP; added squats and sit to stands.       Patient will benefit from skilled therapeutic intervention in order to improve the following deficits and impairments:  Abnormal gait, Decreased activity tolerance, Decreased balance, Decreased endurance, Decreased mobility, Decreased range of motion, Decreased strength, Difficulty walking, Increased edema, Increased fascial restricitons, Impaired flexibility, Pain  Visit Diagnosis: Acute pain of left knee  Muscle weakness (generalized)  Other abnormalities of gait and mobility  Stiffness of left knee, not elsewhere classified     Problem List Patient Active Problem List   Diagnosis Date Noted  . Osteoarthritis of left knee 06/15/2019  . Primary osteoarthritis of left knee 06/04/2019  . A-fib ([facility]) 02/28/2019  . Chronic systolic heart failure [facility]) 02/28/2019    Rayetta Humphrey, PT CLT [phone] 07/30/2019, 10:28 AM  [facility] [address] [place], [place], [address] Phone: [phone]   Fax:  [phone]  Name: Sara Leach MRN: [id number] Date of Birth:  [birth year]

## 2019-07-31 ENCOUNTER — Telehealth (HOSPITAL_COMMUNITY): Payer: Self-pay | Admitting: Physical Therapy

## 2019-07-31 ENCOUNTER — Ambulatory Visit (HOSPITAL_COMMUNITY): Payer: Medicare Other | Admitting: Physical Therapy

## 2019-07-31 ENCOUNTER — Other Ambulatory Visit (HOSPITAL_COMMUNITY): Payer: Self-pay | Admitting: Internal Medicine

## 2019-07-31 NOTE — Telephone Encounter (Signed)
pt dtr has covid she had to cancel this appt due to the rules and protocal. Pt stated that she will get tested this week and call back to let us know the results.

## 2019-08-01 ENCOUNTER — Ambulatory Visit (HOSPITAL_COMMUNITY): Payer: Medicare Other | Admitting: Physical Therapy

## 2019-08-01 DIAGNOSIS — Z03818 Encounter for observation for suspected exposure to other biological agents ruled out: Secondary | ICD-10-CM | POA: Diagnosis not present

## 2019-08-02 ENCOUNTER — Encounter

## 2019-08-06 ENCOUNTER — Telehealth (HOSPITAL_COMMUNITY): Payer: Self-pay | Admitting: Physical Therapy

## 2019-08-06 ENCOUNTER — Ambulatory Visit (HOSPITAL_COMMUNITY): Payer: Medicare Other | Admitting: Physical Therapy

## 2019-08-06 NOTE — Telephone Encounter (Signed)
pt called to cancel that she cannot come today's appt since she had a covid test that was negative but she needs to bring in her test results prior to coming in for therapy

## 2019-08-07 ENCOUNTER — Encounter (INDEPENDENT_AMBULATORY_CARE_PROVIDER_SITE_OTHER): Payer: Medicare Other | Admitting: Cardiology

## 2019-08-07 ENCOUNTER — Telehealth (HOSPITAL_COMMUNITY): Payer: Self-pay | Admitting: Cardiology

## 2019-08-07 DIAGNOSIS — G4733 Obstructive sleep apnea (adult) (pediatric): Secondary | ICD-10-CM

## 2019-08-07 DIAGNOSIS — I5022 Chronic systolic (congestive) heart failure: Secondary | ICD-10-CM

## 2019-08-07 NOTE — Telephone Encounter (Signed)
-----   Message from Jolaine Artist, MD sent at 08/07/2019 12:30 PM EST ----- EF 33%. Please refer to EP to consider ICD

## 2019-08-08 ENCOUNTER — Ambulatory Visit (HOSPITAL_COMMUNITY): Payer: Medicare Other | Admitting: Physical Therapy

## 2019-08-08 ENCOUNTER — Encounter (HOSPITAL_COMMUNITY): Payer: Self-pay | Admitting: Physical Therapy

## 2019-08-08 ENCOUNTER — Other Ambulatory Visit: Payer: Self-pay

## 2019-08-08 DIAGNOSIS — M25562 Pain in left knee: Secondary | ICD-10-CM | POA: Diagnosis not present

## 2019-08-08 DIAGNOSIS — R2689 Other abnormalities of gait and mobility: Secondary | ICD-10-CM | POA: Diagnosis not present

## 2019-08-08 DIAGNOSIS — M25662 Stiffness of left knee, not elsewhere classified: Secondary | ICD-10-CM | POA: Diagnosis not present

## 2019-08-08 DIAGNOSIS — M6281 Muscle weakness (generalized): Secondary | ICD-10-CM

## 2019-08-08 NOTE — Therapy (Signed)
[facility] [place], [place], [address] Phone: [phone]   Fax:  [phone]  Physical Therapy Treatment  Patient Details  Name: Sara Leach MRN: [id number] Date of Birth: 1941-05-01 Referring Provider (PT): Elizabeth Sauer   Encounter Date: 08/08/2019  PT End of Session - 08/08/19 1124    Visit Number  13    Number of Visits  18    Date for PT Re-Evaluation  08/14/19   Progress note complete visit #10; 07/25/19   Authorization Type  BCBS medicare    Authorization Time Period  07/03/19-->08/14/19; Progress note complete visit #10; 07/25/19    Authorization - Visit Number  4    Authorization - Number of Visits  10    PT Start Time  1120    PT Stop Time  1200    PT Time Calculation (min)  40 min    Equipment Utilized During Treatment  Other (comment)   [email or web address]   Activity Tolerance  Patient tolerated treatment well;No increased pain    Behavior During Therapy  WFL for tasks assessed/performed       Past Medical History:  Diagnosis Date  . Atrial fibrillation ([facility]) 01/2019  . Breast cancer ([facility])   . Cataracts, bilateral   . GERD (gastroesophageal reflux disease)   . Hyperlipidemia   . Hypertension   . Hyperthyroidism   . Obesity   . Osteoarthritis   . Systolic heart failure ([facility]) 01/2019   EF 40% by echo done 01/17/2019    Past Surgical History:  Procedure Laterality Date  . BREAST LUMPECTOMY  07/09/2008  . MASTECTOMY, PARTIAL  07/09/2008  . REPLACEMENT TOTAL KNEE Right 2007  . RIGHT/LEFT HEART CATH AND CORONARY ANGIOGRAPHY N/A 03/07/2019   Procedure: RIGHT/LEFT HEART CATH AND CORONARY ANGIOGRAPHY;  Surgeon: Jolaine Artist, MD;  Location: [facility];  Service: Cardiovascular;  Laterality: N/A;  . TOTAL KNEE ARTHROPLASTY Left 06/15/2019   Procedure: TOTAL KNEE ARTHROPLASTY WITH ADDUCTOR CANAL;  Surgeon: Sydnee Cabal, MD;  Location: [facility];  Service: Orthopedics;  Laterality: Left;     There were no vitals filed for this visit.  Subjective Assessment - 08/08/19 1123    Subjective  Patient says she is doing well, no pain currenlty. Patient says she has been doing her exercise and using a pedal bike at home.    Pertinent History  OA    How long can you stand comfortably?  several hours    Patient Stated Goals  To be able to go up her steps to sleep in her bedroom upstairs, To walk for 20 minutes for exercises, ride a stationary bike, to be able to look after her grandchildren, stop pain medication    Currently in Pain?  No/denies                       [facility] Adult PT Treatment/Exercise - 08/08/19 0001      Knee/Hip Exercises: Stretches   Knee: Self-Stretch to increase Flexion  Left;5 reps;10 seconds    Knee: Self-Stretch Limitations  12" step     Gastroc Stretch  Both;3 reps;30 seconds    Gastroc Stretch Limitations  on slant board      Knee/Hip Exercises: Aerobic   Recumbent Bike  4 min for ROM. Seat 13, level 3.       Knee/Hip Exercises: Standing   Terminal Knee Extension  Left;15 reps;Theraband    Theraband Level (Terminal Knee Extension)  Other (  comment)    Terminal Knee Extension Limitations  purple    Stairs  5 RT, 7 inch step, single rail, reciprocal gait     Gait Training  226 feet, SPC, cues on stride length     Other Standing Knee Exercises  step over 4in hurdles, 10 feet, 2RT    Other Standing Knee Exercises  Tandem stance, 3 x 30" solid floor      Knee/Hip Exercises: Seated   Sit to Sand  2 sets;10 reps;without UE support      Knee/Hip Exercises: Supine   Knee Extension  AROM    Knee Extension Limitations  2    Knee Flexion  AROM    Knee Flexion Limitations  115      Manual Therapy   Manual Therapy  Passive ROM    Manual therapy comments  all manual performed separately from other skilled interventions    Passive ROM  PROM LT knee ext, 5 x 10"; knee flex, 3 x 5"               PT Short Term Goals - 07/25/19 1115       PT SHORT TERM GOAL #1   Title  PT Lt knee ROM to be less than 5 to 110 degrees to allow pt to ambulate with more normalized gt and squat easier to pick items off of the floor.    Time  3    Period  Weeks    Status  On-going    Target Date  07/24/19      PT SHORT TERM GOAL #2   Title  PT LE strength bilaterally to improve 1/2 grade to allow pt to feel confident walking with a cane indoors.    Time  3    Period  Weeks    Status  Achieved      PT SHORT TERM GOAL #3   Title  PT to be able to stand for 20 minutes without resting to begin making easy small meals    Time  3    Period  Weeks    Status  On-going      PT SHORT TERM GOAL #4   Title  PT to be able to walk for 10 minutes with a cane with no increase of left knee pain.    Time  3    Period  Weeks    Status  Achieved        PT Long Term Goals - 07/25/19 1116      PT LONG TERM GOAL #1   Title  PT LT knee ROM to be less than three to 120 to allow pt to squat  easier to take care of her great grandchildren.    Time  6    Period  Weeks    Status  On-going      PT LONG TERM GOAL #2   Title  PT LE strength bilaterally to be increased one grade to be able to go up and down one flight of steps to be able to sleep in her bedroom.    Time  3    Period  Weeks    Status  On-going      PT LONG TERM GOAL #3   Title  PT to be able to balance for 10 seconds on each LE to allow pt to feel confident walking outside on uneven ground with a cane.    Time  6    Period  Weeks  Status  On-going      PT LONG TERM GOAL #4   Title  PT pain in her LT knee to be no greater than a 1/10 to allow pt to be up for an hour at a time without having to sit to be able to watch her great grandchildren and to complete her cooking and housecleaning.    Time  6    Period  Weeks    Status  On-going            Plan - 08/08/19 1306    Clinical Impression Statement  Patient progressing well to LTGs. Patient still limited by mild weakness  and LT knee AROM deficits, but overall much improved. Patient able to ambulate full lap around gym before noting fatigue. Patient was able to ambulate 7 in stairs using single hand rail with reciprocal gait. Patient also showed good return with ground clearance during step over 4 inch hurdles. Patient still with slight static balance deficit but notes this is normal at baseline. Patient says she feels ready for DC next visit to continue with HEP at home. Will reassess and address any issues or concerns at next visit.    Examination-Activity Limitations  Bed Mobility;Bathing;Bend;Carry;Dressing;Lift;Locomotion Level;Squat;Stairs;Stand    Examination-Participation Restrictions  Church;Cleaning;Community Activity;Laundry;Meal Prep;Yard Work    Stability/Clinical Decision Making  Stable/Uncomplicated    Rehab Potential  Good    PT Frequency  3x / week    PT Duration  3 weeks    PT Treatment/Interventions  Therapeutic activities;Therapeutic exercise;Balance training;Functional mobility training;Stair training;Gait training;Patient/family education;Manual techniques;Passive range of motion    PT Next Visit Plan  Reasses next visit, review goals and POC. Possible DC to HEP.    PT Home Exercise Plan  HH has given pt HEP; added squats and sit to stands.       Patient will benefit from skilled therapeutic intervention in order to improve the following deficits and impairments:  Abnormal gait, Decreased activity tolerance, Decreased balance, Decreased endurance, Decreased mobility, Decreased range of motion, Decreased strength, Difficulty walking, Increased edema, Increased fascial restricitons, Impaired flexibility, Pain  Visit Diagnosis: Acute pain of left knee  Muscle weakness (generalized)  Other abnormalities of gait and mobility  Stiffness of left knee, not elsewhere classified     Problem List Patient Active Problem List   Diagnosis Date Noted  . Osteoarthritis of left knee 06/15/2019  .  Primary osteoarthritis of left knee 06/04/2019  . A-fib ([facility]) 02/28/2019  . Chronic systolic heart failure ([place]) 02/28/2019   1:17 PM, 08/08/19 Josue Hector PT DPT  Physical Therapist with [facility]  [phone] [address] [place], [place], [address] Phone: [phone]   Fax:  [phone]  Name: Sara Leach MRN: [id number] Date of Birth: 1940-12-14

## 2019-08-09 ENCOUNTER — Encounter (HOSPITAL_COMMUNITY): Payer: Self-pay | Admitting: Physical Therapy

## 2019-08-09 ENCOUNTER — Ambulatory Visit (HOSPITAL_COMMUNITY): Payer: Medicare Other | Admitting: Physical Therapy

## 2019-08-09 DIAGNOSIS — R2689 Other abnormalities of gait and mobility: Secondary | ICD-10-CM | POA: Diagnosis not present

## 2019-08-09 DIAGNOSIS — M25562 Pain in left knee: Secondary | ICD-10-CM

## 2019-08-09 DIAGNOSIS — M25662 Stiffness of left knee, not elsewhere classified: Secondary | ICD-10-CM | POA: Diagnosis not present

## 2019-08-09 DIAGNOSIS — M6281 Muscle weakness (generalized): Secondary | ICD-10-CM

## 2019-08-09 NOTE — Therapy (Signed)
[facility] [address] [place], [place], [address] Phone: [phone]   Fax:  [phone]  Physical Therapy Treatment/ Discharge Summary  Patient Details  Name: Sara Leach MRN: [id number] Date of Birth: Jan 17, 1941 Referring Provider (PT): Elizabeth Sauer   Encounter Date: 08/09/2019   PHYSICAL THERAPY DISCHARGE SUMMARY  Visits from Start of Care: 14  Current functional level related to goals / functional outcomes: See below   Remaining deficits: See below    Education / Equipment: See assessment  Plan: Patient agrees to discharge.  Patient goals were partially met. Patient is being discharged due to being pleased with the current functional level.  ?????       PT End of Session - 08/09/19 1131    Visit Number  14    Number of Visits  18    Date for PT Re-Evaluation  08/14/19   Progress note complete visit #10; 07/25/19   Authorization Type  BCBS medicare    Authorization Time Period  07/03/19-->08/14/19; Progress note complete visit #10; 07/25/19    Authorization - Visit Number  5    Authorization - Number of Visits  10    PT Start Time  1120    PT Stop Time  1205    PT Time Calculation (min)  45 min    Equipment Utilized During Treatment  Other (comment)   [email or web address]   Activity Tolerance  Patient tolerated treatment well    Behavior During Therapy  WFL for tasks assessed/performed       Past Medical History:  Diagnosis Date  . Atrial fibrillation ([place]) 01/2019  . Breast cancer ([place])   . Cataracts, bilateral   . GERD (gastroesophageal reflux disease)   . Hyperlipidemia   . Hypertension   . Hyperthyroidism   . Obesity   . Osteoarthritis   . Systolic heart failure ([place]) 01/2019   EF 40% by echo done 01/17/2019    Past Surgical History:  Procedure Laterality Date  . BREAST LUMPECTOMY  07/09/2008  . MASTECTOMY, PARTIAL  07/09/2008  . REPLACEMENT TOTAL KNEE Right 2007  . RIGHT/LEFT HEART CATH AND  CORONARY ANGIOGRAPHY N/A 03/07/2019   Procedure: RIGHT/LEFT HEART CATH AND CORONARY ANGIOGRAPHY;  Surgeon: Jolaine Artist, MD;  Location: [facility];  Service: Cardiovascular;  Laterality: N/A;  . TOTAL KNEE ARTHROPLASTY Left 06/15/2019   Procedure: TOTAL KNEE ARTHROPLASTY WITH ADDUCTOR CANAL;  Surgeon: Sydnee Cabal, MD;  Location: [facility];  Service: Orthopedics;  Laterality: Left;    There were no vitals filed for this visit.  Subjective Assessment - 08/09/19 1127    Subjective  Patient says she is doing very well and is "pretty much able to do whatever I want to do". Patient reports no pain currently and that she feels about 90% improved since starting therapy. Patient says she feels ready for DC to home program today.    Pertinent History  OA    How long can you stand comfortably?  several hours    How long can you walk comfortably?  Says shes not sure becuase she hasnt tried more than here at therapy. Is concened about going out shopping etc because of virus.    Patient Stated Goals  To be able to go up her steps to sleep in her bedroom upstairs, To walk for 20 minutes for exercises, ride a stationary bike, to be able to look after her grandchildren, stop pain medication    Currently in Pain?  No/denies  [facility] - 08/09/19 0001      Assessment   Medical Diagnosis  LT TKR    Referring Provider (PT)  Margarita Mail Haus    Onset Date/Surgical Date  06/15/19    Next MD Visit  09/03/19    Prior Therapy  HH; ended 11/20      Precautions   Precautions  None      Restrictions   Weight Bearing Restrictions  No      Balance Screen   Has the patient fallen in the past 6 months  No      [place] residence      Prior Function   Level of Independence  Independent      Cognition   Overall Cognitive Status  Within Functional Limits for tasks assessed      Observation/Other Assessments   Focus on Therapeutic Outcomes  (FOTO)   20% limited    was 37%     AROM   Left Knee Extension  3   was 8   Left Knee Flexion  118   was 109     Strength   Right Hip Flexion  4+/5   was 4   Right Hip Extension  4+/5   was 3   Right Hip ABduction  4/5   was 3+   Left Hip Flexion  4+/5   was 4   Left Hip Extension  4/5   was 3   Left Hip ABduction  4/5   was 3-   Right Knee Flexion  5/5    Right Knee Extension  5/5    Left Knee Flexion  5/5   was 4+   Left Knee Extension  5/5   was 4+   Right Ankle Dorsiflexion  5/5    Left Ankle Dorsiflexion  5/5      Ambulation/Gait   Ambulation/Gait  Yes    Ambulation/Gait Assistance  6: Modified independent (Device/Increase time)    Ambulation Distance (Feet)  271 Feet    Assistive device  Straight cane    Gait Pattern  Trendelenburg    Ambulation Surface  Level;Indoor    Gait Comments  2MWT      Static Standing Balance   Static Standing Balance -  Activities   Single Leg Stance - Right Leg;Single Leg Stance - Left Leg    Static Standing - Comment/# of Minutes  5 sec; 3 sec                    [facility] Adult PT Treatment/Exercise - 08/09/19 0001      Knee/Hip Exercises: Standing   Other Standing Knee Exercises  SLS 3 x10" with intermittent HHA       Knee/Hip Exercises: Seated   Sit to Sand  5 reps      Knee/Hip Exercises: Supine   Quad Sets  5 reps;Left    Heel Slides  Left;5 sets    Knee Extension  AROM    Knee Extension Limitations  3    Knee Flexion  AROM    Knee Flexion Limitations  118      Manual Therapy   Manual Therapy  Passive ROM    Manual therapy comments  all manual performed separately from other skilled interventions    Passive ROM  PROM LT knee ext, 5 x 10"; knee flex, 3 x 5"             PT Education -  08/09/19 1130    Education Details  Educated on reassessment findings, DC and HEP    Person(s) Educated  Patient    Methods  Explanation;Handout    Comprehension  Verbalized understanding       PT Short Term  Goals - 08/09/19 1200      PT SHORT TERM GOAL #1   Title  PT Lt knee ROM to be less than 5 to 110 degrees to allow pt to ambulate with more normalized gt and squat easier to pick items off of the floor.    Time  3    Period  Weeks    Status  Achieved    Target Date  07/24/19      PT SHORT TERM GOAL #2   Title  PT LE strength bilaterally to improve 1/2 grade to allow pt to feel confident walking with a cane indoors.    Time  3    Period  Weeks    Status  Achieved      PT SHORT TERM GOAL #3   Title  PT to be able to stand for 20 minutes without resting to begin making easy small meals    Time  3    Period  Weeks    Status  Achieved      PT SHORT TERM GOAL #4   Title  PT to be able to walk for 10 minutes with a cane with no increase of left knee pain.    Time  3    Period  Weeks    Status  Achieved        PT Long Term Goals - 08/09/19 1201      PT LONG TERM GOAL #1   Title  PT LT knee ROM to be less than three to 120 to allow pt to squat  easier to take care of her great grandchildren.    Baseline  12/31: 3-118deg    Time  6    Period  Weeks    Status  Not Met      PT LONG TERM GOAL #2   Title  PT LE strength bilaterally to be increased one grade to be able to go up and down one flight of steps to be able to sleep in her bedroom.    Baseline  Can ambulate stairs but has since moved bedroom to first floor    Time  3    Period  Weeks    Status  Achieved      PT LONG TERM GOAL #3   Title  PT to be able to balance for 10 seconds on each LE to allow pt to feel confident walking outside on uneven ground with a cane.    Baseline  12/31: RT SLS 5 sec, LT SLS 3 sec    Time  6    Period  Weeks    Status  Not Met      PT LONG TERM GOAL #4   Title  PT pain in her LT knee to be no greater than a 1/10 to allow pt to be up for an hour at a time without having to sit to be able to watch her great grandchildren and to complete her cooking and housecleaning.    Time  6    Period   Weeks    Status  Achieved            Plan - 08/09/19 1215    Clinical Impression Statement  Patient has  made very good progress to LTGS. Patient has currently met all LTGs, except balance which patient reports she has always had difficulty with, and reaching 0-120 deg of ROM. Patient did improve balance significantly with treatment, as well as AROM and was very close to meeting LTG at 3-118 degrees. Patient has stated that she feels she has returned to her prior level of function with ADLs and functional mobility, and is now able to do all activities in home with no pain and no difficulty. Patient would like to continue with home exercise, and was educated on and issued updated HEP. Patient instructed to follow up with therapy services with any further questions or concerns.    Examination-Activity Limitations  Bed Mobility;Bathing;Bend;Carry;Dressing;Lift;Locomotion Level;Squat;Stairs;Stand    Examination-Participation Restrictions  Church;Cleaning;Community Activity;Laundry;Meal Prep;Yard Work    Stability/Clinical Decision Making  Stable/Uncomplicated    Rehab Potential  Good    PT Treatment/Interventions  Therapeutic activities;Therapeutic exercise;Balance training;Functional mobility training;Stair training;Gait training;Patient/family education;Manual techniques;Passive range of motion    PT Next Visit [place]  [place] has given pt HEP; added squats and sit to stands.    Consulted and Agree with Plan of Care  Patient       Patient will benefit from skilled therapeutic intervention in order to improve the following deficits and impairments:  Abnormal gait, Decreased activity tolerance, Decreased balance, Decreased endurance, Decreased mobility, Decreased range of motion, Decreased strength, Difficulty walking, Increased edema, Increased fascial restricitons, Impaired flexibility, Pain  Visit Diagnosis: Acute pain of left knee  Muscle weakness  (generalized)  Other abnormalities of gait and mobility  Stiffness of left knee, not elsewhere classified     Problem List Patient Active Problem List   Diagnosis Date Noted  . Osteoarthritis of left knee 06/15/2019  . Primary osteoarthritis of left knee 06/04/2019  . A-fib ([place]) 02/28/2019  . Chronic systolic heart failure ([place]) 02/28/2019   12:25 PM, 08/09/19 Josue Hector PT DPT  Physical Therapist with [facility]  [phone] [place] [address] [place], [place], [address] Phone: [phone]   Fax:  [phone]  Name: Sara Leach MRN: [id number] Date of Birth: 07/28/1941

## 2019-08-14 ENCOUNTER — Telehealth (HOSPITAL_COMMUNITY): Payer: Self-pay

## 2019-08-14 NOTE — Telephone Encounter (Signed)
Called Patient to discuss her [place] Sleep Study because [employer] sent a fax to me stating that they were unable to contact. After speaking with the Patient she stated that she completed one sleep study and sent it back. The company called her and asked how was she sleeping and she let them know that she sleeps in a recliner so the company asked her to do another study when she is able to lay in a bed. The Patient states that she is now sleeping in a bed and is ready for the 2nd test. I gave her the number to contact [employer] tho set the 2nd test up. Patient verbalized understanding and wrote number down for [employer].

## 2019-08-15 ENCOUNTER — Ambulatory Visit (HOSPITAL_COMMUNITY)
Admission: RE | Admit: 2019-08-15 | Discharge: 2019-08-15 | Disposition: A | Discharge status: Home / Self Care | Payer: Medicare Other | Source: Ambulatory Visit | Attending: Internal Medicine | Admitting: Internal Medicine

## 2019-08-15 ENCOUNTER — Other Ambulatory Visit: Payer: Self-pay

## 2019-08-15 ENCOUNTER — Encounter (HOSPITAL_COMMUNITY): Payer: Self-pay | Admitting: Internal Medicine

## 2019-08-15 VITALS — BP 110/66 | HR 112 | Wt 209.8 lb

## 2019-08-15 DIAGNOSIS — G4733 Obstructive sleep apnea (adult) (pediatric): Secondary | ICD-10-CM | POA: Diagnosis not present

## 2019-08-15 DIAGNOSIS — Z79890 Hormone replacement therapy (postmenopausal): Secondary | ICD-10-CM | POA: Diagnosis not present

## 2019-08-15 DIAGNOSIS — I428 Other cardiomyopathies: Secondary | ICD-10-CM | POA: Insufficient documentation

## 2019-08-15 DIAGNOSIS — I110 Hypertensive heart disease with heart failure: Secondary | ICD-10-CM | POA: Diagnosis not present

## 2019-08-15 DIAGNOSIS — Z7901 Long term (current) use of anticoagulants: Secondary | ICD-10-CM | POA: Insufficient documentation

## 2019-08-15 DIAGNOSIS — Z8249 Family history of ischemic heart disease and other diseases of the circulatory system: Secondary | ICD-10-CM | POA: Diagnosis not present

## 2019-08-15 DIAGNOSIS — I5021 Acute systolic (congestive) heart failure: Secondary | ICD-10-CM | POA: Insufficient documentation

## 2019-08-15 DIAGNOSIS — I5022 Chronic systolic (congestive) heart failure: Secondary | ICD-10-CM | POA: Insufficient documentation

## 2019-08-15 DIAGNOSIS — Z853 Personal history of malignant neoplasm of breast: Secondary | ICD-10-CM | POA: Diagnosis not present

## 2019-08-15 DIAGNOSIS — K219 Gastro-esophageal reflux disease without esophagitis: Secondary | ICD-10-CM | POA: Diagnosis not present

## 2019-08-15 DIAGNOSIS — I4819 Other persistent atrial fibrillation: Secondary | ICD-10-CM | POA: Diagnosis not present

## 2019-08-15 DIAGNOSIS — Z923 Personal history of irradiation: Secondary | ICD-10-CM | POA: Diagnosis not present

## 2019-08-15 DIAGNOSIS — Z79899 Other long term (current) drug therapy: Secondary | ICD-10-CM | POA: Diagnosis not present

## 2019-08-15 DIAGNOSIS — M1990 Unspecified osteoarthritis, unspecified site: Secondary | ICD-10-CM | POA: Diagnosis not present

## 2019-08-15 DIAGNOSIS — E785 Hyperlipidemia, unspecified: Secondary | ICD-10-CM | POA: Insufficient documentation

## 2019-08-15 MED ORDER — SPIRONOLACTONE 25 MG PO TABS: 25.0000 mg | ORAL_TABLET | Freq: Every day | ORAL | 3 refills | Status: DC

## 2019-08-15 MED ORDER — METOPROLOL SUCCINATE ER 100 MG PO TB24: 100.0000 mg | ORAL_TABLET | Freq: Every day | ORAL | 5 refills | Status: DC

## 2019-08-15 NOTE — Patient Instructions (Addendum)
INCREASE Toprol to 100mg  (1 tab) at night  INCREASE Spironolactone to 25mg  (1 tab) daily  Labs to be done at Dr Olin Pia office on 08/20/19.  Please remind them to do labs  We will only contact you if something comes back abnormal or we need to make some changes. Otherwise no news is good news!  Your physician recommends that you schedule a follow-up appointment in: 4 weeks with Pharmacy and 3 months with Dr Haroldine Laws.  Please call office at [phone] option 2 if you have any questions or concerns.   At the [facility], you and your health needs are our priority. As part of our continuing mission to provide you with exceptional heart care, we have created designated Provider Care Teams. These Care Teams include your primary Cardiologist (physician) and Advanced Practice Providers (APPs- Physician Assistants and Nurse Practitioners) who all work together to provide you with the care you need, when you need it.   You may see any of the following providers on your designated Care Team at your next follow up: Marland Kitchen Dr Glori Bickers . Dr Loralie Champagne . Darrick Grinder, NP . Lyda Jester, PA . Audry Riles, PharmD   Please be sure to bring in all your medications bottles to every appointment.  '

## 2019-08-15 NOTE — Progress Notes (Signed)
ADVANCED [facility] CLINIC  NOTE  Referring Physician: [facility] Care: Dr. Dalia Heading Primary Cardiologist: New  HPI:  Sara Leach is a 79 y/o woman with h/o obesity, breast CA (2009) - s/p XRT and partial mastectomy (no chemo), HTN , HL who is referred by Dr. Robin Searing for further evaluation of recent onset AF and systolic HF.   First found to be in AF during regular physical exam in 4/20. In June got echo at [facility] showed EF 40% with anterior and AS HK, mild MR/TR. + marked biatrial enlargement. Started on lisinopril and Toprol and Eliquis. Referred for further eval.    We saw her for the first time in 7/20 and repeat echo .with EF with anterior and anteroseptal WMA. Underwent cath as below with normal coronary arteries. EF 30-35%. Normal RHC  Zio placed to assess adequacy of rate control with chronic AF (10/20) 1. Continuous atrial fibrillation rates ranging from 58-181 bpm (avg of 100 bpm).  2. Rare PVCs (< 1.0%)  Sleep study  AHI 8.5/hr   cMRI 07/20/19 1. Normal left ventricular size, thickness and severely decreased systolic function (LVEF = 33%). There is diffuse hypokinesis and no late gadolinium enhancement in the left ventricular myocardium. ECV is elevated at 32%. (T1 pre contrast: 1012 ms, T1 post contrast 1762 Ms). 2. RV is normal   She returns today for routine follow-up. Feels good. Can do all ADLs without problem. Mild DOE with exertion. Had L TKA in 11/20 and just released from rehab. No orthopnea or PND. No bleeding with Eliquis   Echo 10/20 30-35%   Cath 03/07/19  Ao = 135/79 (103) LV =  140/7 RA =  3  RV =  26/6 PA =  27/9 (19) PCW = 9 Fick cardiac output/index = 5.0/2.4 PVR = 2.0 WU SVR = 1587 Ao sat = 100% PA sat = 68%, 71%  Assessment: 1. Normal coronaries 2. NICM EF 30-35% 3. Well-compensated hemodynamics. No evidence of PAH   Past Medical History:  Diagnosis Date  . Atrial fibrillation ([place]) 01/2019  . Breast cancer ([place])    . Cataracts, bilateral   . GERD (gastroesophageal reflux disease)   . Hyperlipidemia   . Hypertension   . Hyperthyroidism   . Obesity   . Osteoarthritis   . Systolic heart failure ([place]) 01/2019   EF 40% by echo done 01/17/2019    Current Outpatient Medications  Medication Sig Dispense Refill  . acetaminophen (TYLENOL) 500 MG tablet Take 500 mg by mouth 2 (two) times a day.    Marland Kitchen apixaban (ELIQUIS) 5 MG TABS tablet Take 5 mg by mouth 2 (two) times daily.     . calcium carbonate (OS-CAL) 600 MG tablet Take 600 mg by mouth daily.    Marland Kitchen ENTRESTO 24-26 MG TAKE 1 TABLET BY MOUTH TWICE DAILY. 60 tablet 0  . Famotidine (PEPCID AC MAXIMUM STRENGTH) 20 MG CHEW Chew 1 tablet by mouth at bedtime.    . furosemide (LASIX) 40 MG tablet Take 1 tablet (40 mg total) by mouth every Monday, Wednesday, and Friday. 30 tablet 3  . levothyroxine (SYNTHROID) 75 MCG tablet Take 75 mcg by mouth daily before breakfast.    . Liniments (PAIN RELIEF EX) Apply 1 application topically 3 (three) times daily as needed (back pain.). [place] Gel - White Active Ingredients  Camphor 3.00% Menthol 5.00%    . metoprolol succinate (TOPROL-XL) 25 MG 24 hr tablet Take 3 tablets (75 mg total) by mouth at bedtime. (Patient taking  differently: Take 25 mg by mouth at bedtime. ) 90 tablet 3  . Multiple Vitamins-Minerals (EMERGEN-C VITAMIN C PO) Take by mouth.    . potassium chloride SA (K-DUR) 20 MEQ tablet Take 1 tablet (20 mEq total) by mouth daily. 30 tablet 6  . spironolactone (ALDACTONE) 25 MG tablet Take 0.5 tablets (12.5 mg total) by mouth daily. 45 tablet 3   No current facility-administered medications for this encounter.    No Known Allergies    Social History   Socioeconomic History  . Marital status: Married    Spouse name: Not on file  . Number of children: Not on file  . Years of education: Not on file  . Highest education level: Not on file  Occupational History  . Not on file  Tobacco Use  . Smoking status:  Never Smoker  . Smokeless tobacco: Never Used  Substance and Sexual Activity  . Alcohol use: Yes    Comment: occasionally has wine  . Drug use: Never  . Sexual activity: Not on file  Other Topics Concern  . Not on file  Social History Narrative  . Not on file   Social Determinants of Health   Financial Resource Strain:   . Difficulty of Paying Living Expenses: Not on file  Food Insecurity:   . Worried About Charity fundraiser in the Last Year: Not on file  . Ran Out of Food in the Last Year: Not on file  Transportation Needs:   . Lack of Transportation (Medical): Not on file  . Lack of Transportation (Non-Medical): Not on file  Physical Activity:   . Days of Exercise per Week: Not on file  . Minutes of Exercise per Session: Not on file  Stress:   . Feeling of Stress : Not on file  Social Connections:   . Frequency of Communication with Friends and Family: Not on file  . Frequency of Social Gatherings with Friends and Family: Not on file  . Attends Religious Services: Not on file  . Active Member of Clubs or Organizations: Not on file  . Attends Archivist Meetings: Not on file  . Marital Status: Not on file  Intimate Partner Violence:   . Fear of Current or Ex-Partner: Not on file  . Emotionally Abused: Not on file  . Physically Abused: Not on file  . Sexually Abused: Not on file      Family History  Problem Relation Age of Onset  . Ovarian cancer Mother   . Coronary artery disease Father   . AAA (abdominal aortic aneurysm) Father 54       cause of death  . Alzheimer's disease Sister   . Alzheimer's disease Brother   . Heart defect Son        VSD, casued death at 25 m old  . Alzheimer's disease Brother   . Alzheimer's disease Brother     Vitals:   08/15/19 0925  BP: 110/66  Pulse: (!) 112  SpO2: 98%  Weight: 95.2 kg (209 lb 12.8 oz)    PHYSICAL EXAM: General:  Well appearing. No resp difficulty HEENT: normal Neck: supple. no JVD. Carotids  2+ bilat; no bruits. No lymphadenopathy or thryomegaly appreciated. Cor: PMI nondisplaced. Irregular rate & rhythm. No rubs, gallops or murmurs. Lungs: clear Abdomen: soft, nontender, nondistended. No hepatosplenomegaly. No bruits or masses. Good bowel sounds. Extremities: no cyanosis, clubbing, rash, trace edema Neuro: alert & orientedx3, cranial nerves grossly intact. moves all 4 extremities w/o difficulty. Affect  pleasant   ECG: AF 83. LVH LAFB IVCD 120ms  Personally reviewed  ASSESSMENT & PLAN:  1. Acute systolic HF due to NICM - Echo 6/20 [facility] EF 40% with anterior and AS HK, mild MR/TR. + marked biatrial enlargement.  - Echo 7/20 EF 30% with anterior and AS HK, mild MR/TR + marked biatrial enlargement. - Cath 7/20 No CAD. EF 30-35% - Echo 10/20  EF 30-35% - NYHA II-early III - Volume status looks ok - Continue Entresto 49/51 bid (BP a bit soft to titrate) - Increase Toprol back to 100 mg daily - Continue lasix to 40 mg M/W/F.  - Increase spiro to 25 - cMRI reviewed. LVEF 33%. No LGE - Etiology of CM remains unclear. AF rate likely not fast enough to cause Cm. cMRI ok. Only mild OSA.  - Will continue to titrate GDMT - Has appt with EP for ICD eval on Monday - Labs today  2. Persistent AF - by echo report seems like she may have restrictive CM with severe biatrial enlargement. Suspect this may be cause of AF - Rate improved on Toprol but still a bit fast on Zio monitor. Will increase Toprol to 100 daily again - Continue Apixaban - Will not pursue DC-CV at this point as unlikely to hold NSR with LAE.   3. HTN - Blood pressure well controlled. Continue current regimen.  4. Fatigue/snoring - Sleep study complete. Mild OSA 8.5.  - Suggest weight loss   Total time spent 40 minutes. Over half that time spent discussing above.   Sara Bickers, MD  9:40 AM

## 2019-08-16 ENCOUNTER — Other Ambulatory Visit (HOSPITAL_COMMUNITY): Payer: Self-pay | Admitting: *Deleted

## 2019-08-16 MED ORDER — METOPROLOL SUCCINATE ER 100 MG PO TB24: 100.0000 mg | ORAL_TABLET | Freq: Every day | ORAL | 5 refills | Status: DC

## 2019-08-16 MED ORDER — SPIRONOLACTONE 25 MG PO TABS: 25.0000 mg | ORAL_TABLET | Freq: Every day | ORAL | 3 refills | Status: DC

## 2019-08-20 ENCOUNTER — Other Ambulatory Visit: Payer: Self-pay

## 2019-08-20 ENCOUNTER — Ambulatory Visit: Payer: Medicare Other | Admitting: Internal Medicine

## 2019-08-20 ENCOUNTER — Encounter: Payer: Self-pay | Admitting: Internal Medicine

## 2019-08-20 VITALS — BP 122/68 | HR 77 | Ht 66.0 in | Wt 209.6 lb

## 2019-08-20 DIAGNOSIS — I5022 Chronic systolic (congestive) heart failure: Secondary | ICD-10-CM

## 2019-08-20 DIAGNOSIS — I4819 Other persistent atrial fibrillation: Secondary | ICD-10-CM | POA: Diagnosis not present

## 2019-08-20 DIAGNOSIS — I428 Other cardiomyopathies: Secondary | ICD-10-CM

## 2019-08-20 NOTE — Progress Notes (Signed)
ELECTROPHYSIOLOGY CONSULT NOTE  Patient ID: Sara Leach, MRN: [id number], DOB/AGE: 1941/07/30 79 y.o. Admit date: (Not on file) Date of Consult: 08/20/2019  Primary Physician: Curlene Labrum, MD Primary Cardiologist: DB     Sara Leach is a 79 y.o. female who is being seen today for the evaluation of ICD  at the request of DB.    HPI Sara Leach is a 79 y.o. female referred for consideration of an ICD.  She was found 4/20 to be in atrial fibrillation.  Echocardiogram 6/20 demonstrated depressed left ventricular function  She has a history of remote breast cancer 2009 (radiation therapy and partial mastectomy)-right-sided  She is largely limited in her exercise capacity because of her knees for which she underwent knee replacement just a few months ago.  There is now much less pain but her mobility is still not nearly optimal.  She denies nocturnal dyspnea.  She has chronic bilateral peripheral edema.  No orthopnea or PND.  No syncope.  DATE TEST EF   6/20 Echo   40 %   7/20 Echo   30 % BAE-severe  7/20 LHC 30-35% Cors w/o obstruction  10/20 Echo  30-35%   12/20 cMRI  33% LGE -         Date Cr K Hgb  10/20 1.24 4.4 14.1            Event Recorder 9/20 personnally reviewed  HR Avg 100 *(mean HR often > 100); efforts to increase rate control with higher doses of metoprolol were not tolerated initially but by changing it to nighttime she has not tolerated higher doses.. ECG QRSd 110 msec   Past Medical History:  Diagnosis Date  . Atrial fibrillation ([place]) 01/2019  . Breast cancer ([place])   . Cataracts, bilateral   . GERD (gastroesophageal reflux disease)   . Hyperlipidemia   . Hypertension   . Hyperthyroidism   . Obesity   . Osteoarthritis   . Systolic heart failure ([place]) 01/2019   EF 40% by echo done 01/17/2019      Surgical History:  Past Surgical History:  Procedure Laterality Date  . BREAST LUMPECTOMY  07/09/2008  .  MASTECTOMY, PARTIAL  07/09/2008  . REPLACEMENT TOTAL KNEE Right 2007  . RIGHT/LEFT HEART CATH AND CORONARY ANGIOGRAPHY N/A 03/07/2019   Procedure: RIGHT/LEFT HEART CATH AND CORONARY ANGIOGRAPHY;  Surgeon: Jolaine Artist, MD;  Location: [facility];  Service: Cardiovascular;  Laterality: N/A;  . TOTAL KNEE ARTHROPLASTY Left 06/15/2019   Procedure: TOTAL KNEE ARTHROPLASTY WITH ADDUCTOR CANAL;  Surgeon: Sydnee Cabal, MD;  Location: [facility];  Service: Orthopedics;  Laterality: Left;     Home Meds: Current Meds  Medication Sig  . acetaminophen (TYLENOL) 500 MG tablet Take 500 mg by mouth 2 (two) times a day.  Marland Kitchen apixaban (ELIQUIS) 5 MG TABS tablet Take 5 mg by mouth 2 (two) times daily.   . calcium carbonate (OS-CAL) 600 MG tablet Take 600 mg by mouth daily.  Marland Kitchen ENTRESTO 24-26 MG TAKE 1 TABLET BY MOUTH TWICE DAILY.  . Famotidine (PEPCID AC MAXIMUM STRENGTH) 20 MG CHEW Chew 1 tablet by mouth at bedtime.  . furosemide (LASIX) 40 MG tablet Take 1 tablet (40 mg total) by mouth every Monday, Wednesday, and Friday.  . levothyroxine (SYNTHROID) 75 MCG tablet Take 75 mcg by mouth daily before breakfast.  . Liniments (PAIN RELIEF EX) Apply 1 application topically 3 (three) times daily as needed (back  pain.). [place] Gel - White Active Ingredients  Camphor 3.00% Menthol 5.00%  . metoprolol succinate (TOPROL-XL) 100 MG 24 hr tablet Take 1 tablet (100 mg total) by mouth at bedtime.  . Multiple Vitamins-Minerals (EMERGEN-C VITAMIN C PO) Take by mouth.  . potassium chloride SA (K-DUR) 20 MEQ tablet Take 1 tablet (20 mEq total) by mouth daily.  Marland Kitchen spironolactone (ALDACTONE) 25 MG tablet Take 1 tablet (25 mg total) by mouth daily.    Allergies: No Known Allergies  Social History   Socioeconomic History  . Marital status: Married    Spouse name: Not on file  . Number of children: Not on file  . Years of education: Not on file  . Highest education level: Not on file  Occupational History  . Not  on file  Tobacco Use  . Smoking status: Never Smoker  . Smokeless tobacco: Never Used  Substance and Sexual Activity  . Alcohol use: Yes    Comment: occasionally has wine  . Drug use: Never  . Sexual activity: Not on file  Other Topics Concern  . Not on file  Social History Narrative  . Not on file   Social Determinants of Health   Financial Resource Strain:   . Difficulty of Paying Living Expenses: Not on file  Food Insecurity:   . Worried About Charity fundraiser in the Last Year: Not on file  . Ran Out of Food in the Last Year: Not on file  Transportation Needs:   . Lack of Transportation (Medical): Not on file  . Lack of Transportation (Non-Medical): Not on file  Physical Activity:   . Days of Exercise per Week: Not on file  . Minutes of Exercise per Session: Not on file  Stress:   . Feeling of Stress : Not on file  Social Connections:   . Frequency of Communication with Friends and Family: Not on file  . Frequency of Social Gatherings with Friends and Family: Not on file  . Attends Religious Services: Not on file  . Active Member of Clubs or Organizations: Not on file  . Attends Archivist Meetings: Not on file  . Marital Status: Not on file  Intimate Partner Violence:   . Fear of Current or Ex-Partner: Not on file  . Emotionally Abused: Not on file  . Physically Abused: Not on file  . Sexually Abused: Not on file     Family History  Problem Relation Age of Onset  . Ovarian cancer Mother   . Coronary artery disease Father   . AAA (abdominal aortic aneurysm) Father 76       cause of death  . Alzheimer's disease Sister   . Alzheimer's disease Brother   . Heart defect Son        VSD, casued death at 55 m old  . Alzheimer's disease Brother   . Alzheimer's disease Brother      ROS:  Please see the history of present illness.     All other systems reviewed and negative.    Physical Exam:  Blood pressure 122/68, pulse 77, height 5\' 6"  (1.676 m),  weight 209 lb 9.6 oz (95.1 kg), SpO2 97 %. General: Well developed, well nourished female in no acute distress. Head: Normocephalic, atraumatic, sclera non-icteric, no xanthomas, nares are without discharge. EENT: normal  Lymph Nodes:  none Neck: Negative for carotid bruits. JVD 8 cm  Back:without scoliosis kyphosis  Lungs: Clear bilaterally to auscultation without wheezes, rales, or rhonchi. Breathing is  unlabored. Heart: Irregularly irregular rate and rhythm  with S1 S2. No  murmur . No rubs, or gallops appreciated. Abdomen: Soft, non-tender, non-distended with normoactive bowel sounds. No hepatomegaly. No rebound/guarding. No obvious abdominal masses. Msk:  Strength and tone appear normal for age. Extremities: No clubbing or cyanosis.  3+  edema.  Distal pedal pulses are 2+ and equal bilaterally. Skin: Warm and Dry Neuro: Alert and oriented X 3. CN III-XII intact Grossly normal sensory and motor function . Psych:  Responds to questions appropriately with a normal affect.      Labs: Cardiac Enzymes No results for input(s): CKTOTAL, CKMB, TROPONINI in the last 72 hours. CBC Lab Results  Component Value Date   WBC 5.8 06/06/2019   HGB 14.1 06/06/2019   HCT 43.1 06/06/2019   MCV 96.4 06/06/2019   PLT 303 06/06/2019   PROTIME: No results for input(s): LABPROT, INR in the last 72 hours. Chemistry No results for input(s): NA, K, CL, CO2, BUN, CREATININE, CALCIUM, PROT, BILITOT, ALKPHOS, ALT, AST, GLUCOSE in the last 168 hours.  Invalid input(s): LABALBU Lipids No results found for: CHOL, HDL, LDLCALC, TRIG BNP No results found for: PROBNP Thyroid Function Tests: No results for input(s): TSH, T4TOTAL, T3FREE, THYROIDAB in the last 72 hours.  Invalid input(s): FREET3 Miscellaneous No results found for: DDIMER  Radiology/Studies:  No results found.  EKG: Atrial fibrillation at 83 -/11/37 Right axis deviation 104 12/6-personally reviewed   Assessment and Plan:  Atrial  fibrillation-permanent  Cardiomyopathy-nonischemic  Congestive heart failure-chronic-systolic   The patient has a nonischemic cardiomyopathy the cause of which is not clear but the possibility of rate related myopathy has been raised.  Event recording 9/20 had an average heart rate of 100.  Subsequent to that efforts to increase metoprolol were off again and on again and she has tolerating it well at this point.  Resting heart rates have been in the 80s and 90s.  Repeat Holter monitoring would be of value to assess the average heart rate to see if we can implicate rate related to her cardiomyopathy.  Her maximal heart rate excursion was significantly high on that monitor; her exercise has been largely limited by knees for which she recently underwent knee replacement and is regaining her mobility.  We will need to assess functional status going forward as her mobility improves.  In this regard I wonder whether digoxin may not have some role for augmented rate control if necessary.  Given her comorbidities and her age, I think the benefit of an ICD given competing causes of mortality is not sufficiently high to justify its implantation.  There may be a role for AV junction ablation and pacing and at that time, we could consider an ICD as an alternative generator given the reviews from 2018 from meta-analysis supporting implantation elderly cohort.  This is true not withstanding DANISH but certainly ambivalence and trepidation about implantation for primary prevention is informed by that study. On Anticoagulation;  No bleeding issues    Virl Axe

## 2019-08-20 NOTE — Patient Instructions (Signed)
Medication Instructions:  Your physician recommends that you continue on your current medications as directed. Please refer to the Current Medication list given to you today.  *If you need a refill on your cardiac medications before your next appointment, please call your pharmacy*  Lab Work: None ordered.  If you have labs (blood work) drawn today and your tests are completely normal, you will receive your results only by: Marland Kitchen MyChart Message (if you have MyChart) OR . A paper copy in the mail If you have any lab test that is abnormal or we need to change your treatment, we will call you to review the results.  Testing/Procedures: None ordered.   Follow-Up: At [facility], you and your health needs are our priority.  As part of our continuing mission to provide you with exceptional heart care, we have created designated Provider Care Teams.  These Care Teams include your primary Cardiologist (physician) and Advanced Practice Providers (APPs -  Physician Assistants and Nurse Practitioners) who all work together to provide you with the care you need, when you need it.  Your next appointment:  As needed with Dr Caryl Comes

## 2019-08-24 ENCOUNTER — Telehealth (HOSPITAL_COMMUNITY): Payer: Self-pay | Admitting: *Deleted

## 2019-08-24 DIAGNOSIS — I4819 Other persistent atrial fibrillation: Secondary | ICD-10-CM

## 2019-08-24 NOTE — Telephone Encounter (Signed)
-----   Message from Jolaine Artist, MD sent at 08/24/2019 11:39 AM EST -----   ----- Message ----- From: Scarlette Calico, RN Sent: 08/24/2019   8:34 AM EST To: Jolaine Artist, MD  You want me to just order another Zio for her??? If so how long you want her to wear it? ----- Message ----- From: Deboraha Sprang, MD Sent: 08/21/2019   9:40 AM EST To: Jolaine Artist, MD, Scarlette Calico, RN  Guys  Thought a repeat monitor now on higher dose toprol would be helpful and amybe dig if needs more HR support thnks

## 2019-08-24 NOTE — Progress Notes (Signed)
Yes. Please repeat zio

## 2019-08-24 NOTE — Telephone Encounter (Signed)
Editor: Bensimhon, Shaune Pascal, MD (Physician)     Show:Clear all [x] Manual[] Template[] Copied  Added by: [x] Bensimhon, Shaune Pascal, MD  [] Hover for details Yes. Please repeat zio       Spoke w/pt, she is aware and agreeable, will repeat 7 day Zio.  Pt prefers to come into the office and get it placed rather than having to place it herself at home, appt sch for Mon 1/18 at 11 am.

## 2019-08-27 ENCOUNTER — Ambulatory Visit (HOSPITAL_COMMUNITY)
Admission: RE | Admit: 2019-08-27 | Discharge: 2019-08-27 | Disposition: A | Discharge status: Home / Self Care | Payer: Medicare Other | Source: Ambulatory Visit | Attending: Internal Medicine | Admitting: Internal Medicine

## 2019-08-27 ENCOUNTER — Other Ambulatory Visit (HOSPITAL_COMMUNITY): Payer: Self-pay | Admitting: Internal Medicine

## 2019-08-27 ENCOUNTER — Other Ambulatory Visit: Payer: Self-pay

## 2019-08-27 DIAGNOSIS — I4819 Other persistent atrial fibrillation: Secondary | ICD-10-CM

## 2019-09-03 DIAGNOSIS — Z96652 Presence of left artificial knee joint: Secondary | ICD-10-CM | POA: Diagnosis not present

## 2019-09-03 DIAGNOSIS — Z471 Aftercare following joint replacement surgery: Secondary | ICD-10-CM | POA: Diagnosis not present

## 2019-09-04 DIAGNOSIS — M47816 Spondylosis without myelopathy or radiculopathy, lumbar region: Secondary | ICD-10-CM | POA: Diagnosis not present

## 2019-09-04 DIAGNOSIS — M5442 Lumbago with sciatica, left side: Secondary | ICD-10-CM | POA: Diagnosis not present

## 2019-09-04 DIAGNOSIS — M9903 Segmental and somatic dysfunction of lumbar region: Secondary | ICD-10-CM | POA: Diagnosis not present

## 2019-09-05 ENCOUNTER — Encounter (HOSPITAL_COMMUNITY): Payer: Self-pay | Admitting: *Deleted

## 2019-09-05 NOTE — Progress Notes (Signed)
Received fax from [facility], they need clinical documentation that supports the order/prescrption of the [place] service.  Dr Olin Pia OV note from 08/20/19 which states reason for repeat monitor faxed to them at [phone] [facility]

## 2019-09-06 NOTE — Telephone Encounter (Signed)
Reached out to patient to schedule her itamar sleep study and she wants to talk to Dr Haroldine Laws first at her next appointment and if he thinks she still needs the study she wants him to order it for her.

## 2019-09-07 DIAGNOSIS — M47816 Spondylosis without myelopathy or radiculopathy, lumbar region: Secondary | ICD-10-CM | POA: Diagnosis not present

## 2019-09-07 DIAGNOSIS — M5442 Lumbago with sciatica, left side: Secondary | ICD-10-CM | POA: Diagnosis not present

## 2019-09-07 DIAGNOSIS — M9903 Segmental and somatic dysfunction of lumbar region: Secondary | ICD-10-CM | POA: Diagnosis not present

## 2019-09-07 NOTE — Progress Notes (Addendum)
Referring Physician: [facility] Primary Care: Dr. Dalia Heading HF Cardiologist: Dr. Haroldine Laws  HPI:  Sara Leach is a 79 y/o woman with h/o obesity, breast CA (2009) - s/p XRT and partial mastectomy (no chemo), HTN , HL who is referred by Dr. Robin Searing for further evaluation of recent onset AF and systolic HF.   First found to be in AF during regular physical exam in 4/20. In June got echo at [facility] showed EF 40% with anterior and AS HK, mild MR/TR. + marked biatrial enlargement. Started on lisinopril and Toprol and Eliquis. Referred for further eval.    We saw her for the first time in 7/20 and repeat echo .with EF with anterior and anteroseptal WMA. Underwent cath as below with normal coronary arteries. EF 30-35%. Normal RHC  Zio placed to assess adequacy of rate control with chronic AF (10/20) 1. Continuous atrial fibrillation rates ranging from 58-181 bpm (avg of 100 bpm).  2. Rare PVCs (< 1.0%)  Sleep study  AHI 8.5/hr   Echo 10/20 30-35%  cMRI 07/20/19 1. Normal left ventricular size, thickness and severely decreased systolic function (LVEF = 33%). There is diffuse hypokinesis and no late gadolinium enhancement in the left ventricular myocardium. ECV is elevated at 32%. (T1 pre contrast: 1012 ms, T1 post contrast 1762 Ms). 2. RV is normal   Recently returned to HF Clinic with Dr. Haroldine Laws on 08/14/2018. At that visit, she reported feeling good. Could do all ADLs without problem. Mild DOE with exertion. Had L TKA in 11/20 and just released from rehab. No orthopnea or PND. No bleeding with Eliquis.   Today she returns to HF clinic for pharmacist medication titration. At last visit with MD, metoprolol succinate was increased to 100 mg daily and spironolactone was increased to 25 mg daily. Overall she is doing well with those changes. No dizziness, lightheadedness, chest pain or palpitations. She complains of some fatigue but this is not limiting and is not worse since  increasing metoprolol. She gets SOB walking over short distances - would have to stop briefly when walking from the clinic to the elevator. Her weight has been stable and she weighs herself daily (normal weight range 204-206 lbs). She takes furosemide 40 mg MWF and has not needed any extra. She has some extra fluid in her left lower leg, but that is stable since her knee surgery. She wears compression stockings. No PND or orthopnea. Her appetite is good and she has been following a low sodium diet.     . Shortness of breath/dyspnea on exertion? yes - with mild activity . Orthopnea/PND? no . Edema? no . Lightheadedness/dizziness? no . Daily weights at home? yes . Blood pressure/heart rate monitoring at home? no . Following low-sodium/fluid-restricted diet? yes  HF Medications: Metoprolol succinate 100 mg daily Entresto 24/26 mg BID Spironolactone 25 mg daily Furosemide 40 mg MWF  Has the patient been experiencing any side effects to the medications prescribed?  no  Does the patient have any problems obtaining medications due to transportation or finances?   States she does not have issues affording medications until she goes in the "donut hole" later in the year. I signed her and her husband up for a [employer] foundation grant today to help decrease the copay of both of their Entresto.   Understanding of regimen: good Understanding of indications: good Potential of compliance: good Patient understands to avoid NSAIDs. Patient understands to avoid decongestants.    Pertinent Lab Values: . Serum creatinine 1.51, BUN 41, Potassium  5.2, Sodium 132, BNP 278.3   Vital Signs: . Weight: 206.8 lbs (last clinic weight: 209.6 lbs) . Blood pressure: 116/78  . Heart rate: 60   Assessment: 1. Acute systolic HF due to NICM - Echo 6/20 [facility] EF 40% with anterior and AS HK, mild MR/TR. + marked biatrial enlargement.  - Echo 7/20 EF 30% with anterior and AS HK, mild MR/TR + marked biatrial  enlargement. - Cath 7/20 No CAD. EF 30-35% - Echo 10/20  EF 30-35% - NYHA II-early III,  Volume status looks ok today - Vitals: BP 116/78, HR 60 - Labs: Scr 1.51, K elevated at 5.2. Will have her stop potassium supplements (it is unclear if she was still taking them or not, although still on med list) and start a low K diet. Repeat BMET in 1 week.  - Continue furosemide 40 mg M/W/F.  - Continue metoprolol succinate 100 mg daily - Continue Entresto 24/26 mg BID  - Continue spironolactone 25 mg daily - Start empagliflozin (Jardiance) 10 mg daily. Repeat BMET in 1 week. Counseled on possible side effects of hypotension, hypovolemia and risk of genital yeast infections.  - cMRI reviewed previously. LVEF 33%. No LGE. - Etiology of CM remains unclear. AF rate likely not fast enough to cause Cm. cMRI ok. Only mild OSA.  - Will continue to titrate GDMT  2. Persistent AF - by echo report seems like she may have restrictive CM with severe biatrial enlargement. Suspect this may be cause of AF - Continue metoprolol succinate 100 mg daily - Continue Apixaban - Will not pursue DC-CV at this point as unlikely to hold NSR with LAE.   3. HTN - Blood pressure well controlled. Continue current regimen.  4. Fatigue/snoring - Sleep study complete. Mild OSA 8.5.  - Suggest weight loss   Plan: 1) Medication changes: Based on clinical presentation, vital signs and recent labs will start Jardiance 10 mg daily. Repeat BMET in 1 week to recheck potassium.  2) Labs: Scr 1.51, K 5.2 3) Follow-up: 4 weeks in HF Clinic for [place], PharmD, BCPS, [facility], CPP Heart Failure Clinic Pharmacist [phone]

## 2019-09-12 ENCOUNTER — Ambulatory Visit (HOSPITAL_COMMUNITY)
Admission: RE | Admit: 2019-09-12 | Discharge: 2019-09-12 | Disposition: A | Discharge status: Home / Self Care | Payer: Medicare Other | Source: Ambulatory Visit | Attending: Internal Medicine | Admitting: Internal Medicine

## 2019-09-12 ENCOUNTER — Other Ambulatory Visit: Payer: Self-pay

## 2019-09-12 ENCOUNTER — Telehealth (HOSPITAL_COMMUNITY): Payer: Self-pay | Admitting: Pharmacist

## 2019-09-12 VITALS — BP 116/78 | HR 60 | Wt 206.8 lb

## 2019-09-12 DIAGNOSIS — R0683 Snoring: Secondary | ICD-10-CM | POA: Insufficient documentation

## 2019-09-12 DIAGNOSIS — I493 Ventricular premature depolarization: Secondary | ICD-10-CM | POA: Diagnosis not present

## 2019-09-12 DIAGNOSIS — I5022 Chronic systolic (congestive) heart failure: Secondary | ICD-10-CM | POA: Diagnosis not present

## 2019-09-12 DIAGNOSIS — Z79899 Other long term (current) drug therapy: Secondary | ICD-10-CM | POA: Insufficient documentation

## 2019-09-12 DIAGNOSIS — R0602 Shortness of breath: Secondary | ICD-10-CM | POA: Diagnosis not present

## 2019-09-12 DIAGNOSIS — E669 Obesity, unspecified: Secondary | ICD-10-CM | POA: Diagnosis not present

## 2019-09-12 DIAGNOSIS — I4819 Other persistent atrial fibrillation: Secondary | ICD-10-CM | POA: Diagnosis not present

## 2019-09-12 DIAGNOSIS — R5383 Other fatigue: Secondary | ICD-10-CM | POA: Diagnosis not present

## 2019-09-12 DIAGNOSIS — Z7901 Long term (current) use of anticoagulants: Secondary | ICD-10-CM | POA: Insufficient documentation

## 2019-09-12 DIAGNOSIS — I110 Hypertensive heart disease with heart failure: Secondary | ICD-10-CM | POA: Diagnosis not present

## 2019-09-12 LAB — BASIC METABOLIC PANEL
Anion gap: 11 (ref 5–15)
BUN: 41 mg/dL — ABNORMAL HIGH (ref 8–23)
CO2: 23 mmol/L (ref 22–32)
Calcium: 9.9 mg/dL (ref 8.9–10.3)
Chloride: 98 mmol/L (ref 98–111)
Creatinine, Ser: 1.51 mg/dL — ABNORMAL HIGH (ref 0.44–1.00)
GFR calc Af Amer: 38 mL/min — ABNORMAL LOW (ref >60)
GFR calc non Af Amer: 33 mL/min — ABNORMAL LOW (ref >60)
Glucose, Bld: 103 mg/dL — ABNORMAL HIGH (ref 70–99)
Potassium: 5.2 mmol/L — ABNORMAL HIGH (ref 3.5–5.1)
Sodium: 132 mmol/L — ABNORMAL LOW (ref 135–145)

## 2019-09-12 LAB — BRAIN NATRIURETIC PEPTIDE: B Natriuretic Peptide: 278.3 pg/mL — ABNORMAL HIGH (ref 0.0–100.0)

## 2019-09-12 MED ORDER — EMPAGLIFLOZIN 10 MG PO TABS: 10.0000 mg | ORAL_TABLET | Freq: Every day | ORAL | 11 refills | Status: DC

## 2019-09-12 NOTE — Telephone Encounter (Signed)
Obtained [employer] grant to assist with Entresto copay.   Member ID: [id number] Group ID: [id number] RxBin ID: [id number] PCN: [id number] Eligibility Start Date: 06/14/2019 Eligibility End Date: 09/10/2020 Assistance Amount: $1,000.00  Audry Riles, PharmD, BCPS, BCCP, CPP Heart Failure Clinic Pharmacist [phone]

## 2019-09-12 NOTE — Patient Instructions (Signed)
It was a pleasure seeing you today!  MEDICATIONS: -We are changing your medications today -Start Jardiance 10 mg (1 tablet) daily -Call if you have questions about your medications.  LABS: -We will call you if your labs need attention.  NEXT APPOINTMENT: Return to clinic in 1 month with Pharmacy Clinic.  In general, to take care of your heart failure: -Limit your fluid intake to 2 Liters (half-gallon) per day.   -Limit your salt intake to ideally 2-3 grams (2000-3000 mg) per day. -Weigh yourself daily and record, and bring that "weight diary" to your next appointment.  (Weight gain of 2-3 pounds in 1 day typically means fluid weight.) -The medications for your heart are to help your heart and help you live longer.   -Please contact us before stopping any of your heart medications.  Call the clinic at [phone] with questions or to reschedule future appointments.

## 2019-09-13 DIAGNOSIS — M5442 Lumbago with sciatica, left side: Secondary | ICD-10-CM | POA: Diagnosis not present

## 2019-09-13 DIAGNOSIS — M47816 Spondylosis without myelopathy or radiculopathy, lumbar region: Secondary | ICD-10-CM | POA: Diagnosis not present

## 2019-09-13 DIAGNOSIS — M9903 Segmental and somatic dysfunction of lumbar region: Secondary | ICD-10-CM | POA: Diagnosis not present

## 2019-09-17 ENCOUNTER — Telehealth (HOSPITAL_COMMUNITY): Payer: Self-pay | Admitting: Cardiology

## 2019-09-17 DIAGNOSIS — M545 Low back pain, unspecified: Secondary | ICD-10-CM

## 2019-09-17 NOTE — Telephone Encounter (Signed)
Patients daughter called to report since patient has started jardiance, patient has c/o back pain near kidney area. Would like to have urine checked for uti at lab appt 2/9

## 2019-09-17 NOTE — Telephone Encounter (Signed)
Per VO Amy Clegg,NP Ok to check urine at appt

## 2019-09-18 ENCOUNTER — Other Ambulatory Visit: Payer: Self-pay

## 2019-09-18 ENCOUNTER — Other Ambulatory Visit (HOSPITAL_COMMUNITY): Payer: Self-pay

## 2019-09-18 ENCOUNTER — Ambulatory Visit (HOSPITAL_COMMUNITY)
Admission: RE | Admit: 2019-09-18 | Discharge: 2019-09-18 | Disposition: A | Discharge status: Home / Self Care | Payer: Medicare Other | Source: Ambulatory Visit | Attending: Cardiology | Admitting: Cardiology

## 2019-09-18 DIAGNOSIS — M545 Low back pain, unspecified: Secondary | ICD-10-CM

## 2019-09-18 DIAGNOSIS — I5022 Chronic systolic (congestive) heart failure: Secondary | ICD-10-CM | POA: Diagnosis not present

## 2019-09-18 LAB — URINALYSIS, ROUTINE W REFLEX MICROSCOPIC
Bilirubin Urine: NEGATIVE
Glucose, UA: 150 mg/dL — AB
Hgb urine dipstick: NEGATIVE
Ketones, ur: NEGATIVE mg/dL
Nitrite: NEGATIVE
Protein, ur: NEGATIVE mg/dL
Specific Gravity, Urine: 1.014 (ref 1.005–1.030)
WBC, UA: >50 WBC/hpf — ABNORMAL HIGH (ref 0–5)
pH: 7 (ref 5.0–8.0)

## 2019-09-18 LAB — BASIC METABOLIC PANEL
Anion gap: 11 (ref 5–15)
BUN: 41 mg/dL — ABNORMAL HIGH (ref 8–23)
CO2: 24 mmol/L (ref 22–32)
Calcium: 9.8 mg/dL (ref 8.9–10.3)
Chloride: 95 mmol/L — ABNORMAL LOW (ref 98–111)
Creatinine, Ser: 1.45 mg/dL — ABNORMAL HIGH (ref 0.44–1.00)
GFR calc Af Amer: 40 mL/min — ABNORMAL LOW (ref >60)
GFR calc non Af Amer: 34 mL/min — ABNORMAL LOW (ref >60)
Glucose, Bld: 100 mg/dL — ABNORMAL HIGH (ref 70–99)
Potassium: 4.1 mmol/L (ref 3.5–5.1)
Sodium: 130 mmol/L — ABNORMAL LOW (ref 135–145)

## 2019-09-18 NOTE — Progress Notes (Signed)
bmet per [employer]

## 2019-09-19 ENCOUNTER — Telehealth (HOSPITAL_COMMUNITY): Payer: Self-pay

## 2019-09-19 LAB — URINE CULTURE

## 2019-09-19 MED ORDER — CIPROFLOXACIN HCL 250 MG PO TABS: 250.0000 mg | ORAL_TABLET | Freq: Two times a day (BID) | ORAL | 0 refills | Status: AC

## 2019-09-19 NOTE — Telephone Encounter (Signed)
Pts granddaughter emily returned call.  Advised of urine results and recommendations to start cipro 250mg  bidx5 days. She reports patient has f/u with primary next week.

## 2019-09-19 NOTE — Telephone Encounter (Signed)
-----   Message from Jolaine Artist, MD sent at 09/18/2019  4:33 PM EST ----- She has UTI.  Please give cipro 250 bid x 5 days and have her f/u with PCP

## 2019-09-21 DIAGNOSIS — I10 Essential (primary) hypertension: Secondary | ICD-10-CM | POA: Diagnosis not present

## 2019-09-21 DIAGNOSIS — E876 Hypokalemia: Secondary | ICD-10-CM | POA: Diagnosis not present

## 2019-09-21 DIAGNOSIS — K219 Gastro-esophageal reflux disease without esophagitis: Secondary | ICD-10-CM | POA: Diagnosis not present

## 2019-09-21 DIAGNOSIS — E039 Hypothyroidism, unspecified: Secondary | ICD-10-CM | POA: Diagnosis not present

## 2019-09-24 DIAGNOSIS — M47816 Spondylosis without myelopathy or radiculopathy, lumbar region: Secondary | ICD-10-CM | POA: Diagnosis not present

## 2019-09-24 DIAGNOSIS — M5442 Lumbago with sciatica, left side: Secondary | ICD-10-CM | POA: Diagnosis not present

## 2019-09-24 DIAGNOSIS — M9903 Segmental and somatic dysfunction of lumbar region: Secondary | ICD-10-CM | POA: Diagnosis not present

## 2019-09-28 NOTE — Progress Notes (Signed)
Referring Physician: [facility] Primary Care: Dr. Dalia Heading HF Cardiologist: Dr. Haroldine Laws  HPI:  Sara Leach is a 79 y/o woman with h/o obesity, breast CA (2009) - s/p XRT and partial mastectomy (no chemo), HTN , HL who is referred by Dr. Robin Searing for further evaluation of recent onset AF and systolic HF.   First found to be in AF during regular physical exam in 4/20. In June got echo at [facility] showed EF 40% with anterior and AS HK, mild MR/TR. + marked biatrial enlargement. Started on lisinopril and Toprol and Eliquis. Referred for further eval.    We saw her for the first time in 7/20 and repeat echo .with EF with anterior and anteroseptal WMA. Underwent cath as below with normal coronary arteries. EF 30-35%. Normal RHC  Zio placed to assess adequacy of rate control with chronic AF (10/20) 1. Continuous atrial fibrillation rates ranging from 58-181 bpm (avg of 100 bpm).  2. Rare PVCs (< 1.0%)  Sleep study  AHI 8.5/hr   Echo 10/20 30-35%  cMRI 07/20/19 1. Normal left ventricular size, thickness and severely decreased systolic function (LVEF = 33%). There is diffuse hypokinesis and no late gadolinium enhancement in the left ventricular myocardium. ECV is elevated at 32%. (T1 pre contrast: 1012 ms, T1 post contrast 1762 Ms). 2. RV is normal   Recently returned to HF Clinic with Dr. Haroldine Laws on 08/14/2018. At that visit, she reported feeling good. Could do all ADLs without problem. Mild DOE with exertion. Had L TKA in 11/20 and just released from rehab. No orthopnea or PND. No bleeding with Eliquis.   Recently returned to HF Clinic for pharmacist medication titration.  Overall she was doing well. No dizziness, lightheadedness, chest pain or palpitations. She complained of some fatigue but this was not limiting and was not worse since increasing metoprolol. She reported getting SOB while walking over short distances - would have to stop briefly when walking from the clinic  to the elevator. Her weight had been stable and she weighs herself daily (normal weight range 204-206 lbs). She takes furosemide 40 mg MWF and had not needed any extra. She had some extra fluid in her left lower leg, but that was stable since her knee surgery. She wears compression stockings. No PND or orthopnea. Her appetite was good and she has been following a low sodium diet.   Today she returns to HF clinic for pharmacist medication titration. At recent visits to clinic, metoprolol succinate was increased to 100 mg daily, spironolactone was increased to 25 mg daily and Jardiance 10 mg daily was added. She did report a UTI a few weeks after starting Jardiance which resolved with ciprofloxacin. Overall she is feeling well today. She did bake 4 cakes on Monday, which tired her out, but she is recovering well. No dizziness, lightheadedness, chest pains or palpitations. She is trying to increase her activity level by walking to the end of the street and back (~2/10ths of a mile) daily. Most of her movement is limited by her knee. Her weight has been stable at home at 205 lbs. She takes furosemide 40 mg MWF. No LEE, PND, orthopnea. Her appetite is good. Taking all medications as prescribed. She received the COVID vaccine a few weeks ago and is scheduled to received the second dose this Saturday.    HF Medications: Metoprolol succinate 100 mg daily Entresto 24/26 mg BID Spironolactone 25 mg daily Empagliflozin (Jardiance) 10 mg daily) Furosemide 40 mg MWF  Has the patient been  experiencing any side effects to the medications prescribed?  Had a UTI with Jardiance. Resolved with course of ciprofloxacin. Will continue to monitor.   Does the patient have any problems obtaining medications due to transportation or finances?   States she does not have issues affording medications until she goes in the "donut hole" later in the year. Has PAN grant for [employer].   Understanding of regimen: good Understanding  of indications: good Potential of compliance: good Patient understands to avoid NSAIDs. Patient understands to avoid decongestants.    Pertinent Lab Values (09/18/19): Marland Kitchen Serum creatinine 1.45, BUN 41, Potassium 4.1, Sodium 130, BNP 278.3 (09/12/19)  Vital Signs: . Weight: 205.2 lbs (last clinic weight: 206.8 lbs) . Blood pressure: 116/78  . Heart rate: 83   Assessment: 1. Acute systolic HF due to NICM - Echo 6/20 [facility] EF 40% with anterior and AS HK, mild MR/TR. + marked biatrial enlargement.  - Echo 7/20 EF 30% with anterior and AS HK, mild MR/TR + marked biatrial enlargement. - Cath 7/20 No CAD. EF 30-35% - Echo 10/20  EF 30-35% - NYHA II-early III,  euvolemic on exam.  - Vitals: BP 116/78, HR 83 - Continue furosemide 40 mg M/W/F.  - Continue metoprolol succinate 100 mg daily - Continue Entresto 24/26 mg BID.   - Continue spironolactone 25 mg daily - Continue empagliflozin (Jardiance) 10 mg daily. Counseled on possible side effects of hypotension, hypovolemia and risk of genital yeast infections. Experienced 1 UTI in early February which resolved with ciprofloxacin. No recurrence.  -Patient is stable and doing well. Will make no medication changes today as BP is on the lower side and she is at a high fall risk with her knee (TKA on left knee 06/2019). - cMRI reviewed previously. LVEF 33%. No LGE. - Etiology of CM remains unclear. AF rate likely not fast enough to cause Cm. cMRI ok. Only mild OSA.  - Will continue to titrate GDMT  2. Persistent AF - by echo report seems like she may have restrictive CM with severe biatrial enlargement. Suspect this may be cause of AF - Continue metoprolol succinate 100 mg daily - Continue Apixaban - Will not pursue DC-CV at this point as unlikely to hold NSR with LAE.   3. HTN - Blood pressure well controlled. Continue current regimen.  4. Fatigue/snoring - Sleep study complete. Mild OSA 8.5.  - Suggest weight loss    Plan: 1) Medication changes: Based on clinical presentation, vital signs and recent labs will make no medication changes today as BP is on the lower side and she is at a high fall risk (TKA on left knee 06/2019). 4) Follow-up: 4 weeks in HF Clinic with Dr. Haroldine Laws.   Audry Riles, PharmD, BCPS, BCCP, CPP Heart Failure Clinic Pharmacist [phone]

## 2019-10-01 DIAGNOSIS — M5442 Lumbago with sciatica, left side: Secondary | ICD-10-CM | POA: Diagnosis not present

## 2019-10-01 DIAGNOSIS — M9903 Segmental and somatic dysfunction of lumbar region: Secondary | ICD-10-CM | POA: Diagnosis not present

## 2019-10-01 DIAGNOSIS — M47816 Spondylosis without myelopathy or radiculopathy, lumbar region: Secondary | ICD-10-CM | POA: Diagnosis not present

## 2019-10-02 DIAGNOSIS — I5022 Chronic systolic (congestive) heart failure: Secondary | ICD-10-CM | POA: Diagnosis not present

## 2019-10-02 DIAGNOSIS — I480 Paroxysmal atrial fibrillation: Secondary | ICD-10-CM | POA: Diagnosis not present

## 2019-10-02 DIAGNOSIS — M1712 Unilateral primary osteoarthritis, left knee: Secondary | ICD-10-CM | POA: Diagnosis not present

## 2019-10-02 DIAGNOSIS — Z96652 Presence of left artificial knee joint: Secondary | ICD-10-CM | POA: Diagnosis not present

## 2019-10-02 DIAGNOSIS — I10 Essential (primary) hypertension: Secondary | ICD-10-CM | POA: Diagnosis not present

## 2019-10-04 DIAGNOSIS — M5442 Lumbago with sciatica, left side: Secondary | ICD-10-CM | POA: Diagnosis not present

## 2019-10-04 DIAGNOSIS — M9903 Segmental and somatic dysfunction of lumbar region: Secondary | ICD-10-CM | POA: Diagnosis not present

## 2019-10-04 DIAGNOSIS — M47816 Spondylosis without myelopathy or radiculopathy, lumbar region: Secondary | ICD-10-CM | POA: Diagnosis not present

## 2019-10-08 DIAGNOSIS — M47816 Spondylosis without myelopathy or radiculopathy, lumbar region: Secondary | ICD-10-CM | POA: Diagnosis not present

## 2019-10-08 DIAGNOSIS — M9903 Segmental and somatic dysfunction of lumbar region: Secondary | ICD-10-CM | POA: Diagnosis not present

## 2019-10-08 DIAGNOSIS — M5442 Lumbago with sciatica, left side: Secondary | ICD-10-CM | POA: Diagnosis not present

## 2019-10-10 ENCOUNTER — Other Ambulatory Visit: Payer: Self-pay

## 2019-10-10 ENCOUNTER — Ambulatory Visit (HOSPITAL_COMMUNITY)
Admission: RE | Admit: 2019-10-10 | Discharge: 2019-10-10 | Disposition: A | Discharge status: Home / Self Care | Payer: Medicare Other | Source: Ambulatory Visit | Attending: Cardiology | Admitting: Cardiology

## 2019-10-10 VITALS — BP 116/78 | HR 83 | Wt 205.2 lb

## 2019-10-10 DIAGNOSIS — I4819 Other persistent atrial fibrillation: Secondary | ICD-10-CM | POA: Diagnosis not present

## 2019-10-10 DIAGNOSIS — I5020 Unspecified systolic (congestive) heart failure: Secondary | ICD-10-CM | POA: Diagnosis present

## 2019-10-10 DIAGNOSIS — Z96652 Presence of left artificial knee joint: Secondary | ICD-10-CM | POA: Diagnosis not present

## 2019-10-10 DIAGNOSIS — E669 Obesity, unspecified: Secondary | ICD-10-CM | POA: Insufficient documentation

## 2019-10-10 DIAGNOSIS — Z923 Personal history of irradiation: Secondary | ICD-10-CM | POA: Diagnosis not present

## 2019-10-10 DIAGNOSIS — G4733 Obstructive sleep apnea (adult) (pediatric): Secondary | ICD-10-CM | POA: Diagnosis not present

## 2019-10-10 DIAGNOSIS — I5021 Acute systolic (congestive) heart failure: Secondary | ICD-10-CM | POA: Diagnosis not present

## 2019-10-10 DIAGNOSIS — Z853 Personal history of malignant neoplasm of breast: Secondary | ICD-10-CM | POA: Diagnosis not present

## 2019-10-10 DIAGNOSIS — I5022 Chronic systolic (congestive) heart failure: Secondary | ICD-10-CM

## 2019-10-10 DIAGNOSIS — I110 Hypertensive heart disease with heart failure: Secondary | ICD-10-CM | POA: Diagnosis not present

## 2019-10-10 DIAGNOSIS — I428 Other cardiomyopathies: Secondary | ICD-10-CM | POA: Insufficient documentation

## 2019-10-10 DIAGNOSIS — Z7984 Long term (current) use of oral hypoglycemic drugs: Secondary | ICD-10-CM | POA: Diagnosis not present

## 2019-10-10 DIAGNOSIS — Z79899 Other long term (current) drug therapy: Secondary | ICD-10-CM | POA: Diagnosis not present

## 2019-10-10 NOTE — Patient Instructions (Signed)
It was a pleasure seeing you today!  MEDICATIONS: -No medication changes today -Call if you have questions about your medications.  NEXT APPOINTMENT: Return to clinic in 4 week with Dr. Haroldine Laws.  In general, to take care of your heart failure: -Limit your fluid intake to 2 Liters (half-gallon) per day.   -Limit your salt intake to ideally 2-3 grams (2000-3000 mg) per day. -Weigh yourself daily and record, and bring that "weight diary" to your next appointment.  (Weight gain of 2-3 pounds in 1 day typically means fluid weight.) -The medications for your heart are to help your heart and help you live longer.   -Please contact us before stopping any of your heart medications.  Call the clinic at [phone] with questions or to reschedule future appointments.

## 2019-10-15 DIAGNOSIS — M5442 Lumbago with sciatica, left side: Secondary | ICD-10-CM | POA: Diagnosis not present

## 2019-10-15 DIAGNOSIS — M9903 Segmental and somatic dysfunction of lumbar region: Secondary | ICD-10-CM | POA: Diagnosis not present

## 2019-10-15 DIAGNOSIS — M47816 Spondylosis without myelopathy or radiculopathy, lumbar region: Secondary | ICD-10-CM | POA: Diagnosis not present

## 2019-10-19 DIAGNOSIS — M9903 Segmental and somatic dysfunction of lumbar region: Secondary | ICD-10-CM | POA: Diagnosis not present

## 2019-10-19 DIAGNOSIS — M47816 Spondylosis without myelopathy or radiculopathy, lumbar region: Secondary | ICD-10-CM | POA: Diagnosis not present

## 2019-10-19 DIAGNOSIS — M5442 Lumbago with sciatica, left side: Secondary | ICD-10-CM | POA: Diagnosis not present

## 2019-10-22 DIAGNOSIS — M5416 Radiculopathy, lumbar region: Secondary | ICD-10-CM | POA: Diagnosis not present

## 2019-10-22 DIAGNOSIS — M545 Low back pain: Secondary | ICD-10-CM | POA: Diagnosis not present

## 2019-10-23 DIAGNOSIS — M5442 Lumbago with sciatica, left side: Secondary | ICD-10-CM | POA: Diagnosis not present

## 2019-10-23 DIAGNOSIS — M9903 Segmental and somatic dysfunction of lumbar region: Secondary | ICD-10-CM | POA: Diagnosis not present

## 2019-10-23 DIAGNOSIS — M47816 Spondylosis without myelopathy or radiculopathy, lumbar region: Secondary | ICD-10-CM | POA: Diagnosis not present

## 2019-10-26 ENCOUNTER — Telehealth (HOSPITAL_COMMUNITY): Payer: Self-pay | Admitting: *Deleted

## 2019-10-26 DIAGNOSIS — M9903 Segmental and somatic dysfunction of lumbar region: Secondary | ICD-10-CM | POA: Diagnosis not present

## 2019-10-26 DIAGNOSIS — M5442 Lumbago with sciatica, left side: Secondary | ICD-10-CM | POA: Diagnosis not present

## 2019-10-26 DIAGNOSIS — M47816 Spondylosis without myelopathy or radiculopathy, lumbar region: Secondary | ICD-10-CM | POA: Diagnosis not present

## 2019-10-26 NOTE — Telephone Encounter (Signed)
That's fine

## 2019-10-26 NOTE — Telephone Encounter (Signed)
Pt's granddaughter Raquel Sarna called to let us know pt is sch for an epidural injection for her back on Sat 3/27 and they have advised her to hold her Eliquis starting Wed 3/24.  She wanted to make sure we were ok with that.  Advised normal hold time is 2 days, will discuss w/Dr Bensimhon and call her back.

## 2019-10-29 NOTE — Telephone Encounter (Signed)
Sara Leach is aware and agreeable

## 2019-10-30 DIAGNOSIS — M9903 Segmental and somatic dysfunction of lumbar region: Secondary | ICD-10-CM | POA: Diagnosis not present

## 2019-10-30 DIAGNOSIS — M5442 Lumbago with sciatica, left side: Secondary | ICD-10-CM | POA: Diagnosis not present

## 2019-10-30 DIAGNOSIS — M47816 Spondylosis without myelopathy or radiculopathy, lumbar region: Secondary | ICD-10-CM | POA: Diagnosis not present

## 2019-11-02 DIAGNOSIS — M47816 Spondylosis without myelopathy or radiculopathy, lumbar region: Secondary | ICD-10-CM | POA: Diagnosis not present

## 2019-11-02 DIAGNOSIS — M9903 Segmental and somatic dysfunction of lumbar region: Secondary | ICD-10-CM | POA: Diagnosis not present

## 2019-11-02 DIAGNOSIS — M5442 Lumbago with sciatica, left side: Secondary | ICD-10-CM | POA: Diagnosis not present

## 2019-11-03 DIAGNOSIS — M5136 Other intervertebral disc degeneration, lumbar region: Secondary | ICD-10-CM | POA: Diagnosis not present

## 2019-11-07 DIAGNOSIS — M549 Dorsalgia, unspecified: Secondary | ICD-10-CM | POA: Diagnosis not present

## 2019-11-07 DIAGNOSIS — M5442 Lumbago with sciatica, left side: Secondary | ICD-10-CM | POA: Diagnosis not present

## 2019-11-07 DIAGNOSIS — M47816 Spondylosis without myelopathy or radiculopathy, lumbar region: Secondary | ICD-10-CM | POA: Diagnosis not present

## 2019-11-07 DIAGNOSIS — M9903 Segmental and somatic dysfunction of lumbar region: Secondary | ICD-10-CM | POA: Diagnosis not present

## 2019-11-12 DIAGNOSIS — M9903 Segmental and somatic dysfunction of lumbar region: Secondary | ICD-10-CM | POA: Diagnosis not present

## 2019-11-12 DIAGNOSIS — M5442 Lumbago with sciatica, left side: Secondary | ICD-10-CM | POA: Diagnosis not present

## 2019-11-12 DIAGNOSIS — M47816 Spondylosis without myelopathy or radiculopathy, lumbar region: Secondary | ICD-10-CM | POA: Diagnosis not present

## 2019-11-14 ENCOUNTER — Ambulatory Visit (HOSPITAL_COMMUNITY)
Admission: RE | Admit: 2019-11-14 | Discharge: 2019-11-14 | Disposition: A | Discharge status: Home / Self Care | Payer: Medicare Other | Source: Ambulatory Visit | Attending: Internal Medicine | Admitting: Internal Medicine

## 2019-11-14 ENCOUNTER — Encounter (HOSPITAL_COMMUNITY): Payer: Self-pay | Admitting: Internal Medicine

## 2019-11-14 VITALS — BP 90/70 | HR 78 | Wt 205.4 lb

## 2019-11-14 DIAGNOSIS — Z923 Personal history of irradiation: Secondary | ICD-10-CM | POA: Insufficient documentation

## 2019-11-14 DIAGNOSIS — I081 Rheumatic disorders of both mitral and tricuspid valves: Secondary | ICD-10-CM | POA: Insufficient documentation

## 2019-11-14 DIAGNOSIS — Z853 Personal history of malignant neoplasm of breast: Secondary | ICD-10-CM | POA: Insufficient documentation

## 2019-11-14 DIAGNOSIS — G4733 Obstructive sleep apnea (adult) (pediatric): Secondary | ICD-10-CM | POA: Diagnosis not present

## 2019-11-14 DIAGNOSIS — I428 Other cardiomyopathies: Secondary | ICD-10-CM | POA: Insufficient documentation

## 2019-11-14 DIAGNOSIS — I110 Hypertensive heart disease with heart failure: Secondary | ICD-10-CM | POA: Insufficient documentation

## 2019-11-14 DIAGNOSIS — Z820 Family history of epilepsy and other diseases of the nervous system: Secondary | ICD-10-CM | POA: Insufficient documentation

## 2019-11-14 DIAGNOSIS — Z8041 Family history of malignant neoplasm of ovary: Secondary | ICD-10-CM | POA: Insufficient documentation

## 2019-11-14 DIAGNOSIS — E785 Hyperlipidemia, unspecified: Secondary | ICD-10-CM | POA: Diagnosis not present

## 2019-11-14 DIAGNOSIS — Z7901 Long term (current) use of anticoagulants: Secondary | ICD-10-CM | POA: Diagnosis not present

## 2019-11-14 DIAGNOSIS — I4891 Unspecified atrial fibrillation: Secondary | ICD-10-CM | POA: Diagnosis not present

## 2019-11-14 DIAGNOSIS — Z79890 Hormone replacement therapy (postmenopausal): Secondary | ICD-10-CM | POA: Diagnosis not present

## 2019-11-14 DIAGNOSIS — Z6833 Body mass index (BMI) 33.0-33.9, adult: Secondary | ICD-10-CM | POA: Insufficient documentation

## 2019-11-14 DIAGNOSIS — I5021 Acute systolic (congestive) heart failure: Secondary | ICD-10-CM | POA: Diagnosis not present

## 2019-11-14 DIAGNOSIS — M1990 Unspecified osteoarthritis, unspecified site: Secondary | ICD-10-CM | POA: Diagnosis not present

## 2019-11-14 DIAGNOSIS — Z7984 Long term (current) use of oral hypoglycemic drugs: Secondary | ICD-10-CM | POA: Diagnosis not present

## 2019-11-14 DIAGNOSIS — K219 Gastro-esophageal reflux disease without esophagitis: Secondary | ICD-10-CM | POA: Insufficient documentation

## 2019-11-14 DIAGNOSIS — I5022 Chronic systolic (congestive) heart failure: Secondary | ICD-10-CM | POA: Diagnosis not present

## 2019-11-14 DIAGNOSIS — I4819 Other persistent atrial fibrillation: Secondary | ICD-10-CM | POA: Diagnosis not present

## 2019-11-14 DIAGNOSIS — Z79899 Other long term (current) drug therapy: Secondary | ICD-10-CM | POA: Insufficient documentation

## 2019-11-14 DIAGNOSIS — E669 Obesity, unspecified: Secondary | ICD-10-CM | POA: Insufficient documentation

## 2019-11-14 DIAGNOSIS — Z8249 Family history of ischemic heart disease and other diseases of the circulatory system: Secondary | ICD-10-CM | POA: Insufficient documentation

## 2019-11-14 LAB — BASIC METABOLIC PANEL
Anion gap: 9 (ref 5–15)
BUN: 46 mg/dL — ABNORMAL HIGH (ref 8–23)
CO2: 23 mmol/L (ref 22–32)
Calcium: 9.8 mg/dL (ref 8.9–10.3)
Chloride: 100 mmol/L (ref 98–111)
Creatinine, Ser: 1.3 mg/dL — ABNORMAL HIGH (ref 0.44–1.00)
GFR calc Af Amer: 46 mL/min — ABNORMAL LOW (ref >60)
GFR calc non Af Amer: 39 mL/min — ABNORMAL LOW (ref >60)
Glucose, Bld: 102 mg/dL — ABNORMAL HIGH (ref 70–99)
Potassium: 4.4 mmol/L (ref 3.5–5.1)
Sodium: 132 mmol/L — ABNORMAL LOW (ref 135–145)

## 2019-11-14 LAB — CBC
HCT: 41.6 % (ref 36.0–46.0)
Hemoglobin: 13.4 g/dL (ref 12.0–15.0)
MCH: 29.8 pg (ref 26.0–34.0)
MCHC: 32.2 g/dL (ref 30.0–36.0)
MCV: 92.4 fL (ref 80.0–100.0)
Platelets: 299 10*3/uL (ref 150–400)
RBC: 4.5 MIL/uL (ref 3.87–5.11)
RDW: 15.3 % (ref 11.5–15.5)
WBC: 5.8 10*3/uL (ref 4.0–10.5)
nRBC: 0 % (ref 0.0–0.2)

## 2019-11-14 LAB — BRAIN NATRIURETIC PEPTIDE: B Natriuretic Peptide: 204.8 pg/mL — ABNORMAL HIGH (ref 0.0–100.0)

## 2019-11-14 NOTE — Patient Instructions (Signed)
Labs done today, we will contact you for abnormal results  Your physician has requested that you have an echocardiogram. Echocardiography is a painless test that uses sound waves to create images of your heart. It provides your doctor with information about the size and shape of your heart and how well your heart's chambers and valves are working. This procedure takes approximately one hour. There are no restrictions for this procedure.  Your physician recommends that you schedule a follow-up appointment in: 4 months  If you have any questions or concerns before your next appointment please send [place] a message through [place] or call our office at [phone].  At the [facility], you and your health needs are our priority. As part of our continuing mission to provide you with exceptional heart care, we have created designated Provider Care Teams. These Care Teams include your primary Cardiologist (physician) and Advanced Practice Providers (APPs- Physician Assistants and Nurse Practitioners) who all work together to provide you with the care you need, when you need it.   You may see any of the following providers on your designated Care Team at your next follow up: Marland Kitchen Dr Glori Bickers . Dr Loralie Champagne . Darrick Grinder, NP . Lyda Jester, PA . Audry Riles, PharmD   Please be sure to bring in all your medications bottles to every appointment.

## 2019-11-14 NOTE — Progress Notes (Signed)
ADVANCED HF CLINIC  NOTE  Referring Physician: [facility] Primary Care: Dr. Dalia Heading Primary Cardiologist: New  HPI:  Sara Leach is a 79 y/o woman with h/o obesity, breast CA (2009) - s/p XRT and partial mastectomy (no chemo), HTN , HL who is referred by Dr. Robin Searing for further evaluation of recent onset AF and systolic HF.   First found to be in AF during regular physical exam in 4/20. In June got echo at [facility] showed EF 40% with anterior and AS HK, mild MR/TR. + marked biatrial enlargement. Started on lisinopril and Toprol and Eliquis. Referred for further eval.    We saw her for the first time in 7/20 and repeat echo .with EF with anterior and anteroseptal WMA. Underwent cath as below with normal coronary arteries. EF 30-35%. Normal RHC  Zio in 1/21 1. Continue atrial fibrillation (100% burden) - ventricular rates ranging from 54- 165 bpm (avg of 97 bpm).  2. Rare PVCs (< 1.0%)    Zio placed to assess adequacy of rate control with chronic AF (10/20) 1. Continuous atrial fibrillation rates ranging from 58-181 bpm (avg of 100 bpm).  2. Rare PVCs (< 1.0%)  Sleep study  AHI 8.5/hr   cMRI 07/20/19 1. Normal left ventricular size, thickness and severely decreased systolic function (LVEF = 33%). There is diffuse hypokinesis and no late gadolinium enhancement in the left ventricular myocardium. ECV is elevated at 32%. (T1 pre contrast: 1012 ms, T1 post contrast 1762 Ms). 2. RV is normal   She returns today for routine follow-up. Feels pretty good. No CP or SOB. No edema. TKA doing well. Saw Dr. Caryl Comes and placed 7-day zio to assess AF rate control. He was not overly enthusiastic about ICD for primary prevention.    Echo 10/20 30-35%   Cath 03/07/19  Ao = 135/79 (103) LV =  140/7 RA =  3  RV =  26/6 PA =  27/9 (19) PCW = 9 Fick cardiac output/index = 5.0/2.4 PVR = 2.0 WU SVR = 1587 Ao sat = 100% PA sat = 68%, 71%  Assessment: 1. Normal coronaries 2.  NICM EF 30-35% 3. Well-compensated hemodynamics. No evidence of PAH   Past Medical History:  Diagnosis Date  . Atrial fibrillation ([place]) 01/2019  . Breast cancer ([place])   . Cataracts, bilateral   . GERD (gastroesophageal reflux disease)   . Hyperlipidemia   . Hypertension   . Hyperthyroidism   . Obesity   . Osteoarthritis   . Systolic heart failure ([place]) 01/2019   EF 40% by echo done 01/17/2019    Current Outpatient Medications  Medication Sig Dispense Refill  . acetaminophen (TYLENOL) 500 MG tablet Take 500 mg by mouth 2 (two) times a day.    Marland Kitchen apixaban (ELIQUIS) 5 MG TABS tablet Take 5 mg by mouth 2 (two) times daily.     . empagliflozin (JARDIANCE) 10 MG TABS tablet Take 10 mg by mouth daily before breakfast. 30 tablet 11  . ENTRESTO 24-26 MG TAKE 1 TABLET BY MOUTH TWICE DAILY. 60 tablet 3  . Famotidine (PEPCID AC MAXIMUM STRENGTH) 20 MG CHEW Chew 1 tablet by mouth at bedtime.    . furosemide (LASIX) 40 MG tablet Take 1 tablet (40 mg total) by mouth every Monday, Wednesday, and Friday. 30 tablet 3  . levothyroxine (SYNTHROID) 75 MCG tablet Take 75 mcg by mouth daily before breakfast.    . Liniments (PAIN RELIEF EX) Apply 1 application topically 3 (three) times daily as  needed (back pain.). [place] Gel - White Active Ingredients  Camphor 3.00% Menthol 5.00%    . metoprolol succinate (TOPROL-XL) 100 MG 24 hr tablet Take 1 tablet (100 mg total) by mouth at bedtime. 30 tablet 5  . Multiple Vitamins-Minerals (EMERGEN-C VITAMIN C PO) Take by mouth.    . spironolactone (ALDACTONE) 25 MG tablet Take 1 tablet (25 mg total) by mouth daily. 90 tablet 3  . hydrochlorothiazide (HYDRODIURIL) 25 MG tablet Take 25 mg by mouth daily.     No current facility-administered medications for this encounter.    No Known Allergies    Social History   Socioeconomic History  . Marital status: Married    Spouse name: Not on file  . Number of children: Not on file  . Years of education: Not on  file  . Highest education level: Not on file  Occupational History  . Not on file  Tobacco Use  . Smoking status: Never Smoker  . Smokeless tobacco: Never Used  Substance and Sexual Activity  . Alcohol use: Yes    Comment: occasionally has wine  . Drug use: Never  . Sexual activity: Not on file  Other Topics Concern  . Not on file  Social History Narrative  . Not on file   Social Determinants of Health   Financial Resource Strain:   . Difficulty of Paying Living Expenses:   Food Insecurity:   . Worried About Charity fundraiser in the Last Year:   . Arboriculturist in the Last Year:   Transportation Needs:   . Film/video editor (Medical):   Marland Kitchen Lack of Transportation (Non-Medical):   Physical Activity:   . Days of Exercise per Week:   . Minutes of Exercise per Session:   Stress:   . Feeling of Stress :   Social Connections:   . Frequency of Communication with Friends and Family:   . Frequency of Social Gatherings with Friends and Family:   . Attends Religious Services:   . Active Member of Clubs or Organizations:   . Attends Archivist Meetings:   Marland Kitchen Marital Status:   Intimate Partner Violence:   . Fear of Current or Ex-Partner:   . Emotionally Abused:   Marland Kitchen Physically Abused:   . Sexually Abused:       Family History  Problem Relation Age of Onset  . Ovarian cancer Mother   . Coronary artery disease Father   . AAA (abdominal aortic aneurysm) Father 48       cause of death  . Alzheimer's disease Sister   . Alzheimer's disease Brother   . Heart defect Son        VSD, casued death at 51 m old  . Alzheimer's disease Brother   . Alzheimer's disease Brother     Vitals:   11/14/19 1056  BP: 90/70  Pulse: 78  SpO2: 99%  Weight: 93.2 kg (205 lb 6.4 oz)    PHYSICAL EXAM: General:  Well appearing. No resp difficulty HEENT: normal Neck: supple. no JVD. Carotids 2+ bilat; no bruits. No lymphadenopathy or thryomegaly appreciated. Cor: PMI  nondisplaced. Irrgular rate & rhythm. No rubs, gallops or murmurs. Lungs: clear Abdomen: soft, nontender, nondistended. No hepatosplenomegaly. No bruits or masses. Good bowel sounds. Extremities: no cyanosis, clubbing, rash, mild edema LLE (leg with recent TKA) Neuro: alert & orientedx3, cranial nerves grossly intact. moves all 4 extremities w/o difficulty. Affect pleasant   ECG: AF 83. LVH LAFB IVCD 113ms  Personally reviewed  ASSESSMENT & PLAN:  1. Acute systolic HF due to NICM - Echo 6/20 [facility] EF 40% with anterior and AS HK, mild MR/TR. + marked biatrial enlargement.  - Echo 7/20 EF 30% with anterior and AS HK, mild MR/TR + marked biatrial enlargement. - Cath 7/20 No CAD. EF 30-35% - Echo 10/20  EF 30-35% - NYHA II-early III. Mostly limited by back pain - Volume status looks ok - Continue Entresto 49/51 bid  - Continue Toprol 100 mg daily - Continue lasix to 40 mg M/W/F.  -  Continue spiro to 25 mg daily - BP too low to titrate GDMT - cMRI reviewed. LVEF 33%. No LGE - Etiology of CM remains unclear. Have suspected this may be due to AF with suboptimal rate control. Recent zio reviewed. Mean VR 97 bpm. BP too low to increase Toprol further - Has seen Dr. Caryl Comes. He was not eager about ICD but felt she might need AVN ablation to control AF.  - Will repeat echo and reassess EF    2. Persistent AF - by echo report seems like she may have restrictive CM with severe biatrial enlargement. Suspect this may be cause of AF - Continue Apixaban - Etiology of CM remains unclear. Have suspected this may be due to AF with suboptimal rate control. Recent zio reviewed. Mean VR 97 bpm. BP too low to increase Toprol further - Has seen Dr. Caryl Comes. He was not eager about ICD but felt she might need AVN ablation to control AF. She has never had a chance at [facility] so may be worth considering before AVN abaltion but given LA size and duration of AF likely will not be successful.   3. HTN - BP  low. Asymptomatic  4. Fatigue/snoring - Sleep study complete. Mild OSA 8.5.  - Suggest weight loss   - No change   Glori Bickers, MD  11:49 AM

## 2019-11-15 DIAGNOSIS — M546 Pain in thoracic spine: Secondary | ICD-10-CM | POA: Diagnosis not present

## 2019-11-19 DIAGNOSIS — M47816 Spondylosis without myelopathy or radiculopathy, lumbar region: Secondary | ICD-10-CM | POA: Diagnosis not present

## 2019-11-19 DIAGNOSIS — M9903 Segmental and somatic dysfunction of lumbar region: Secondary | ICD-10-CM | POA: Diagnosis not present

## 2019-11-19 DIAGNOSIS — M5442 Lumbago with sciatica, left side: Secondary | ICD-10-CM | POA: Diagnosis not present

## 2019-11-26 DIAGNOSIS — M47816 Spondylosis without myelopathy or radiculopathy, lumbar region: Secondary | ICD-10-CM | POA: Diagnosis not present

## 2019-11-26 DIAGNOSIS — M5442 Lumbago with sciatica, left side: Secondary | ICD-10-CM | POA: Diagnosis not present

## 2019-11-26 DIAGNOSIS — M9903 Segmental and somatic dysfunction of lumbar region: Secondary | ICD-10-CM | POA: Diagnosis not present

## 2019-12-03 DIAGNOSIS — M47816 Spondylosis without myelopathy or radiculopathy, lumbar region: Secondary | ICD-10-CM | POA: Diagnosis not present

## 2019-12-03 DIAGNOSIS — M5442 Lumbago with sciatica, left side: Secondary | ICD-10-CM | POA: Diagnosis not present

## 2019-12-03 DIAGNOSIS — Z96652 Presence of left artificial knee joint: Secondary | ICD-10-CM | POA: Diagnosis not present

## 2019-12-03 DIAGNOSIS — M9903 Segmental and somatic dysfunction of lumbar region: Secondary | ICD-10-CM | POA: Diagnosis not present

## 2019-12-03 DIAGNOSIS — Z471 Aftercare following joint replacement surgery: Secondary | ICD-10-CM | POA: Diagnosis not present

## 2019-12-03 DIAGNOSIS — M79672 Pain in left foot: Secondary | ICD-10-CM | POA: Diagnosis not present

## 2019-12-07 DIAGNOSIS — I5022 Chronic systolic (congestive) heart failure: Secondary | ICD-10-CM | POA: Diagnosis not present

## 2019-12-07 DIAGNOSIS — I480 Paroxysmal atrial fibrillation: Secondary | ICD-10-CM | POA: Diagnosis not present

## 2019-12-07 DIAGNOSIS — I10 Essential (primary) hypertension: Secondary | ICD-10-CM | POA: Diagnosis not present

## 2019-12-10 DIAGNOSIS — M5442 Lumbago with sciatica, left side: Secondary | ICD-10-CM | POA: Diagnosis not present

## 2019-12-10 DIAGNOSIS — M47816 Spondylosis without myelopathy or radiculopathy, lumbar region: Secondary | ICD-10-CM | POA: Diagnosis not present

## 2019-12-10 DIAGNOSIS — M9903 Segmental and somatic dysfunction of lumbar region: Secondary | ICD-10-CM | POA: Diagnosis not present

## 2019-12-12 ENCOUNTER — Other Ambulatory Visit: Payer: Self-pay

## 2019-12-12 ENCOUNTER — Ambulatory Visit (INDEPENDENT_AMBULATORY_CARE_PROVIDER_SITE_OTHER): Payer: Medicare Other

## 2019-12-12 DIAGNOSIS — I5022 Chronic systolic (congestive) heart failure: Secondary | ICD-10-CM | POA: Diagnosis not present

## 2019-12-17 DIAGNOSIS — M5442 Lumbago with sciatica, left side: Secondary | ICD-10-CM | POA: Diagnosis not present

## 2019-12-17 DIAGNOSIS — M47816 Spondylosis without myelopathy or radiculopathy, lumbar region: Secondary | ICD-10-CM | POA: Diagnosis not present

## 2019-12-17 DIAGNOSIS — M9903 Segmental and somatic dysfunction of lumbar region: Secondary | ICD-10-CM | POA: Diagnosis not present

## 2019-12-24 DIAGNOSIS — E876 Hypokalemia: Secondary | ICD-10-CM | POA: Diagnosis not present

## 2019-12-24 DIAGNOSIS — M47816 Spondylosis without myelopathy or radiculopathy, lumbar region: Secondary | ICD-10-CM | POA: Diagnosis not present

## 2019-12-24 DIAGNOSIS — E871 Hypo-osmolality and hyponatremia: Secondary | ICD-10-CM | POA: Diagnosis not present

## 2019-12-24 DIAGNOSIS — K219 Gastro-esophageal reflux disease without esophagitis: Secondary | ICD-10-CM | POA: Diagnosis not present

## 2019-12-24 DIAGNOSIS — M5442 Lumbago with sciatica, left side: Secondary | ICD-10-CM | POA: Diagnosis not present

## 2019-12-24 DIAGNOSIS — I10 Essential (primary) hypertension: Secondary | ICD-10-CM | POA: Diagnosis not present

## 2019-12-24 DIAGNOSIS — M9903 Segmental and somatic dysfunction of lumbar region: Secondary | ICD-10-CM | POA: Diagnosis not present

## 2019-12-25 DIAGNOSIS — M5134 Other intervertebral disc degeneration, thoracic region: Secondary | ICD-10-CM | POA: Diagnosis not present

## 2019-12-26 ENCOUNTER — Other Ambulatory Visit (HOSPITAL_COMMUNITY): Payer: Self-pay | Admitting: Internal Medicine

## 2019-12-28 ENCOUNTER — Telehealth (HOSPITAL_COMMUNITY): Payer: Self-pay | Admitting: *Deleted

## 2019-12-28 DIAGNOSIS — I480 Paroxysmal atrial fibrillation: Secondary | ICD-10-CM | POA: Diagnosis not present

## 2019-12-28 DIAGNOSIS — Z96652 Presence of left artificial knee joint: Secondary | ICD-10-CM | POA: Diagnosis not present

## 2019-12-28 DIAGNOSIS — I5022 Chronic systolic (congestive) heart failure: Secondary | ICD-10-CM | POA: Diagnosis not present

## 2019-12-28 DIAGNOSIS — I10 Essential (primary) hypertension: Secondary | ICD-10-CM | POA: Diagnosis not present

## 2019-12-28 NOTE — Telephone Encounter (Signed)
Angela Nevin with [facility] left VM requesting a return call about pt. She stated she need a call about some hypotension issues.  I called Carla back at [phone] and was told today was her half day and that she would be back in the office Monday. I will try Angela Nevin again Monday. I called pt to check on her and see if she had any information and I kept getting a busy tone.

## 2019-12-31 ENCOUNTER — Telehealth (HOSPITAL_COMMUNITY): Payer: Self-pay | Admitting: *Deleted

## 2019-12-31 NOTE — Telephone Encounter (Signed)
Agree 

## 2019-12-31 NOTE — Telephone Encounter (Signed)
Sara Leach with [facility] called to report pts bp 88/60 during office visit on Friday. Pt was asymptomatic Dr.Burdine had pt hold spironolactone and keep a bp log. Pt is to call and report her readings to Dr.Burdine this week they will fax [place] her readings as well. Dr.Burdine would like to know if Dr.Bensimhon had any other recommendations.  Routed to [place]

## 2020-01-01 NOTE — Telephone Encounter (Signed)
Carla with [facility] aware.

## 2020-01-07 DIAGNOSIS — I480 Paroxysmal atrial fibrillation: Secondary | ICD-10-CM | POA: Diagnosis not present

## 2020-01-07 DIAGNOSIS — I5022 Chronic systolic (congestive) heart failure: Secondary | ICD-10-CM | POA: Diagnosis not present

## 2020-01-07 DIAGNOSIS — E876 Hypokalemia: Secondary | ICD-10-CM | POA: Diagnosis not present

## 2020-01-07 DIAGNOSIS — I110 Hypertensive heart disease with heart failure: Secondary | ICD-10-CM | POA: Diagnosis not present

## 2020-01-08 DIAGNOSIS — M47816 Spondylosis without myelopathy or radiculopathy, lumbar region: Secondary | ICD-10-CM | POA: Diagnosis not present

## 2020-01-08 DIAGNOSIS — M5442 Lumbago with sciatica, left side: Secondary | ICD-10-CM | POA: Diagnosis not present

## 2020-01-08 DIAGNOSIS — M9903 Segmental and somatic dysfunction of lumbar region: Secondary | ICD-10-CM | POA: Diagnosis not present

## 2020-01-15 ENCOUNTER — Other Ambulatory Visit (HOSPITAL_COMMUNITY): Payer: Self-pay | Admitting: Internal Medicine

## 2020-01-22 DIAGNOSIS — M5442 Lumbago with sciatica, left side: Secondary | ICD-10-CM | POA: Diagnosis not present

## 2020-01-22 DIAGNOSIS — M47816 Spondylosis without myelopathy or radiculopathy, lumbar region: Secondary | ICD-10-CM | POA: Diagnosis not present

## 2020-01-22 DIAGNOSIS — M9903 Segmental and somatic dysfunction of lumbar region: Secondary | ICD-10-CM | POA: Diagnosis not present

## 2020-02-05 DIAGNOSIS — M9903 Segmental and somatic dysfunction of lumbar region: Secondary | ICD-10-CM | POA: Diagnosis not present

## 2020-02-05 DIAGNOSIS — M47816 Spondylosis without myelopathy or radiculopathy, lumbar region: Secondary | ICD-10-CM | POA: Diagnosis not present

## 2020-02-05 DIAGNOSIS — M5442 Lumbago with sciatica, left side: Secondary | ICD-10-CM | POA: Diagnosis not present

## 2020-02-19 DIAGNOSIS — M9903 Segmental and somatic dysfunction of lumbar region: Secondary | ICD-10-CM | POA: Diagnosis not present

## 2020-02-19 DIAGNOSIS — M47816 Spondylosis without myelopathy or radiculopathy, lumbar region: Secondary | ICD-10-CM | POA: Diagnosis not present

## 2020-02-19 DIAGNOSIS — M5442 Lumbago with sciatica, left side: Secondary | ICD-10-CM | POA: Diagnosis not present

## 2020-03-06 DIAGNOSIS — M47816 Spondylosis without myelopathy or radiculopathy, lumbar region: Secondary | ICD-10-CM | POA: Diagnosis not present

## 2020-03-06 DIAGNOSIS — M5442 Lumbago with sciatica, left side: Secondary | ICD-10-CM | POA: Diagnosis not present

## 2020-03-06 DIAGNOSIS — M9903 Segmental and somatic dysfunction of lumbar region: Secondary | ICD-10-CM | POA: Diagnosis not present

## 2020-03-07 DIAGNOSIS — I110 Hypertensive heart disease with heart failure: Secondary | ICD-10-CM | POA: Diagnosis not present

## 2020-03-07 DIAGNOSIS — E876 Hypokalemia: Secondary | ICD-10-CM | POA: Diagnosis not present

## 2020-03-07 DIAGNOSIS — I5022 Chronic systolic (congestive) heart failure: Secondary | ICD-10-CM | POA: Diagnosis not present

## 2020-03-07 DIAGNOSIS — I480 Paroxysmal atrial fibrillation: Secondary | ICD-10-CM | POA: Diagnosis not present

## 2020-03-11 ENCOUNTER — Other Ambulatory Visit (HOSPITAL_COMMUNITY): Payer: Self-pay | Admitting: Internal Medicine

## 2020-03-18 NOTE — Progress Notes (Addendum)
ADVANCED HF CLINIC  NOTE  Referring Physician: [place] Primary Care: Dr. Dalia Heading Primary Cardiologist: New  HPI:  Ms. Bauers is a 79 y/o woman with h/o obesity, breast CA (2009) - s/p XRT and partial mastectomy (no chemo), HTN , HL who is referred by Dr. Robin Searing for further evaluation of recent onset AF and systolic HF.   First found to be in AF during regular physical exam in 4/20. In June got echo at [facility] showed EF 40% with anterior and AS HK, mild MR/TR. + marked biatrial enlargement. Started on lisinopril and Toprol and Eliquis. Referred for further eval.    We saw her for the first time in 7/20 and repeat echo .with EF with anterior and anteroseptal WMA. Underwent cath as below with normal coronary arteries. EF 30-35%. Normal RHC  Saw Dr. Caryl Comes. Decided against ICD.   F/u echo 5/21 EF 45% (I felt 35-40%)  Here for routine f/u. Feels more tired recently. Says she gets more fatigued easier and more SOB. Feels it is related to increased Toprol dose. Mild LE edema L>R (due to TKA). No orthopnea or PND. PCP stopped spiro due to low BP.   Cardiac studies:  Zio in 1/21 1. Continuous atrial fibrillation (100% burden) - ventricular rates ranging from 54- 165 bpm (avg of 97 bpm).  2. Rare PVCs (< 1.0%)   Zio placed to assess adequacy of rate control with chronic AF (10/20) 1. Continuous atrial fibrillation rates ranging from 58-181 bpm (avg of 100 bpm).  2. Rare PVCs (< 1.0%)  Sleep study  AHI 8.5/hr   cMRI 07/20/19 1. Normal left ventricular size, thickness and severely decreased systolic function (LVEF = 33%). There is diffuse hypokinesis and no late gadolinium enhancement in the left ventricular myocardium. ECV is elevated at 32%. (T1 pre contrast: 1012 ms, T1 post contrast 1762 Ms). 2. RV is normal   Echo 10/20 30-35%  Cath 03/07/19  Ao = 135/79 (103) LV =  140/7 RA =  3  RV =  26/6 PA =  27/9 (19) PCW = 9 Fick cardiac output/index = 5.0/2.4 PVR  = 2.0 WU SVR = 1587 Ao sat = 100% PA sat = 68%, 71%  Assessment: 1. Normal coronaries 2. NICM EF 30-35% 3. Well-compensated hemodynamics. No evidence of PAH   Past Medical History:  Diagnosis Date  . Atrial fibrillation ([place]) 01/2019  . Breast cancer ([place])   . Cataracts, bilateral   . GERD (gastroesophageal reflux disease)   . Hyperlipidemia   . Hypertension   . Hyperthyroidism   . Obesity   . Osteoarthritis   . Systolic heart failure ([place]) 01/2019   EF 40% by echo done 01/17/2019    Current Outpatient Medications  Medication Sig Dispense Refill  . acetaminophen (TYLENOL) 500 MG tablet Take 500 mg by mouth 2 (two) times a day.    Marland Kitchen apixaban (ELIQUIS) 5 MG TABS tablet Take 5 mg by mouth 2 (two) times daily.     . cephALEXin (KEFLEX) 500 MG capsule TAKE FOUR CAPSULES BY MOUTH ONE HOUR PRIOR TO ALL DENTAL APPOINTMENTS    . empagliflozin (JARDIANCE) 10 MG TABS tablet Take 10 mg by mouth daily before breakfast. 30 tablet 11  . ENTRESTO 24-26 MG TAKE 1 TABLET BY MOUTH TWICE DAILY. 180 tablet 3  . Famotidine (PEPCID AC MAXIMUM STRENGTH) 20 MG CHEW Chew 1 tablet by mouth at bedtime.    . furosemide (LASIX) 40 MG tablet TAKE (1) TABLET BY MOUTH EVERY MONDAY,  [facility] AND FRIDAY. 14 tablet 0  . hydrochlorothiazide (HYDRODIURIL) 25 MG tablet Take 25 mg by mouth daily.    Marland Kitchen levothyroxine (SYNTHROID) 75 MCG tablet Take 75 mcg by mouth daily before breakfast.    . Liniments (PAIN RELIEF EX) Apply 1 application topically 3 (three) times daily as needed (back pain.). [place] Gel - White Active Ingredients  Camphor 3.00% Menthol 5.00%    . metoprolol succinate (TOPROL-XL) 100 MG 24 hr tablet TAKE 1 TABLET BY MOUTH AT BEDTIME 90 tablet 3  . Multiple Vitamins-Minerals (EMERGEN-C VITAMIN C PO) Take by mouth.     No current facility-administered medications for this encounter.    No Known Allergies    Social History   Socioeconomic History  . Marital status: Married    Spouse name:  Not on file  . Number of children: Not on file  . Years of education: Not on file  . Highest education level: Not on file  Occupational History  . Not on file  Tobacco Use  . Smoking status: Never Smoker  . Smokeless tobacco: Never Used  Substance and Sexual Activity  . Alcohol use: Yes    Comment: occasionally has wine  . Drug use: Never  . Sexual activity: Not on file  Other Topics Concern  . Not on file  Social History Narrative  . Not on file   Social Determinants of Health   Financial Resource Strain:   . Difficulty of Paying Living Expenses:   Food Insecurity:   . Worried About Charity fundraiser in the Last Year:   . Arboriculturist in the Last Year:   Transportation Needs:   . Film/video editor (Medical):   Marland Kitchen Lack of Transportation (Non-Medical):   Physical Activity:   . Days of Exercise per Week:   . Minutes of Exercise per Session:   Stress:   . Feeling of Stress :   Social Connections:   . Frequency of Communication with Friends and Family:   . Frequency of Social Gatherings with Friends and Family:   . Attends Religious Services:   . Active Member of Clubs or Organizations:   . Attends Archivist Meetings:   Marland Kitchen Marital Status:   Intimate Partner Violence:   . Fear of Current or Ex-Partner:   . Emotionally Abused:   Marland Kitchen Physically Abused:   . Sexually Abused:       Family History  Problem Relation Age of Onset  . Ovarian cancer Mother   . Coronary artery disease Father   . AAA (abdominal aortic aneurysm) Father 51       cause of death  . Alzheimer's disease Sister   . Alzheimer's disease Brother   . Heart defect Son        VSD, casued death at 70 m old  . Alzheimer's disease Brother   . Alzheimer's disease Brother     Vitals:   03/19/20 1116  BP: 130/90  Pulse: 98  SpO2: 96%  Weight: 97.8 kg (215 lb 9.6 oz)    PHYSICAL EXAM: General:  Elderly. No resp difficulty HEENT: normal Neck: supple. no JVD. Carotids 2+ bilat; no  bruits. No lymphadenopathy or thryomegaly appreciated. Cor: PMI nondisplaced. Irregular rate & rhythm. No rubs, gallops or murmurs. Lungs: clear Abdomen: obese soft, nontender, nondistended. No hepatosplenomegaly. No bruits or masses. Good bowel sounds. Extremities: no cyanosis, clubbing, rash, 1+ L>R edema Neuro: alert & orientedx3, cranial nerves grossly intact. moves all 4 extremities w/o difficulty.  Affect pleasant  ECG: AF 86. LAFB IVCD 121ms  Personally reviewed  ASSESSMENT & PLAN:  1. Acute systolic HF due to NICM - Echo 6/20 [facility] EF 40% with anterior and AS HK, mild MR/TR. + marked biatrial enlargement.  - Echo 7/20 EF 30% with anterior and AS HK, mild MR/TR + marked biatrial enlargement. - Cath 7/20 No CAD. EF 30-35% - Echo 10/20  EF 30-35% - cMRI 12/20. LVEF 33%. No LGE - Echo 5/21 EF 45% (I feel it is 35-40%) - Worse NYHA III with more fatigue. Not sure if it is related to her HF or b-blocker. - Volume status mildly elevated - Continue Entresto 49/51 bid  - Decrease Toprol to 50 mg daily to see if helps with fatigue - Continue lasix to 40 mg M/W/F.  - Add Farxiga 10 - discussed risks of vaginal yeast infections - Off spiro due to low BP  - BP too low to titrate GDMT - Etiology of CM remains unclear. Have suspected this may be due to AF with suboptimal rate control. Recent zio reviewed. Mean VR 97 bpm. - Has seen Dr. Caryl Comes. He was not eager about ICD but felt she might need AVN ablation to control AF.  - I suspect worsening symptoms related to combination of HF and high-dose b-blocker. Will cut b-blocker but suspect rate will go back up > 100. I think she is heading toward AVN ablation to control her AF and possibly allow for LV recovery.    2. Persistent AF - by echo report seems like she may have restrictive CM with severe biatrial enlargement. Suspect this may be cause of AF - Continue Apixaban - Etiology of CM remains unclear. Have suspected this may be due  to AF with suboptimal rate control. Recent zio reviewed. Mean VR 97 bpm.  - Need to decrease Toprol. See plan above  - Has seen Dr. Caryl Comes. He was not eager about ICD but felt she might need AVN ablation to control AF. She has never had a chance at [facility] so may be worth considering before AVN abaltion but given LA size and duration of AF likely will not be successful.   3. HTN - BP stable   4. Fatigue/snoring - Sleep study complete. Mild OSA 8.5.  - Suggest weight loss   - No change  Total time spent 45 minutes. Over half that time spent discussing above.    Glori Bickers, MD  11:54 AM

## 2020-03-19 ENCOUNTER — Ambulatory Visit (HOSPITAL_COMMUNITY)
Admission: RE | Admit: 2020-03-19 | Discharge: 2020-03-19 | Disposition: A | Discharge status: Home / Self Care | Payer: Medicare Other | Source: Ambulatory Visit | Attending: Internal Medicine | Admitting: Internal Medicine

## 2020-03-19 ENCOUNTER — Other Ambulatory Visit: Payer: Self-pay

## 2020-03-19 VITALS — BP 130/90 | HR 98 | Wt 215.6 lb

## 2020-03-19 DIAGNOSIS — Z853 Personal history of malignant neoplasm of breast: Secondary | ICD-10-CM | POA: Insufficient documentation

## 2020-03-19 DIAGNOSIS — I5022 Chronic systolic (congestive) heart failure: Secondary | ICD-10-CM

## 2020-03-19 DIAGNOSIS — Z7901 Long term (current) use of anticoagulants: Secondary | ICD-10-CM | POA: Insufficient documentation

## 2020-03-19 DIAGNOSIS — K219 Gastro-esophageal reflux disease without esophagitis: Secondary | ICD-10-CM | POA: Diagnosis not present

## 2020-03-19 DIAGNOSIS — I5023 Acute on chronic systolic (congestive) heart failure: Secondary | ICD-10-CM | POA: Insufficient documentation

## 2020-03-19 DIAGNOSIS — Z7984 Long term (current) use of oral hypoglycemic drugs: Secondary | ICD-10-CM | POA: Insufficient documentation

## 2020-03-19 DIAGNOSIS — I4819 Other persistent atrial fibrillation: Secondary | ICD-10-CM | POA: Diagnosis not present

## 2020-03-19 DIAGNOSIS — Z791 Long term (current) use of non-steroidal anti-inflammatories (NSAID): Secondary | ICD-10-CM | POA: Insufficient documentation

## 2020-03-19 DIAGNOSIS — I428 Other cardiomyopathies: Secondary | ICD-10-CM | POA: Insufficient documentation

## 2020-03-19 DIAGNOSIS — Z79890 Hormone replacement therapy (postmenopausal): Secondary | ICD-10-CM | POA: Diagnosis not present

## 2020-03-19 DIAGNOSIS — I10 Essential (primary) hypertension: Secondary | ICD-10-CM

## 2020-03-19 DIAGNOSIS — Z8249 Family history of ischemic heart disease and other diseases of the circulatory system: Secondary | ICD-10-CM | POA: Insufficient documentation

## 2020-03-19 DIAGNOSIS — E785 Hyperlipidemia, unspecified: Secondary | ICD-10-CM | POA: Insufficient documentation

## 2020-03-19 DIAGNOSIS — I110 Hypertensive heart disease with heart failure: Secondary | ICD-10-CM | POA: Insufficient documentation

## 2020-03-19 DIAGNOSIS — E669 Obesity, unspecified: Secondary | ICD-10-CM | POA: Insufficient documentation

## 2020-03-19 DIAGNOSIS — M1990 Unspecified osteoarthritis, unspecified site: Secondary | ICD-10-CM | POA: Diagnosis not present

## 2020-03-19 DIAGNOSIS — G4733 Obstructive sleep apnea (adult) (pediatric): Secondary | ICD-10-CM | POA: Insufficient documentation

## 2020-03-19 DIAGNOSIS — E0590 Thyrotoxicosis, unspecified without thyrotoxic crisis or storm: Secondary | ICD-10-CM | POA: Insufficient documentation

## 2020-03-19 DIAGNOSIS — Z79899 Other long term (current) drug therapy: Secondary | ICD-10-CM | POA: Diagnosis not present

## 2020-03-19 DIAGNOSIS — Z9010 Acquired absence of unspecified breast and nipple: Secondary | ICD-10-CM | POA: Diagnosis not present

## 2020-03-19 LAB — BASIC METABOLIC PANEL
Anion gap: 10 (ref 5–15)
BUN: 25 mg/dL — ABNORMAL HIGH (ref 8–23)
CO2: 23 mmol/L (ref 22–32)
Calcium: 9.6 mg/dL (ref 8.9–10.3)
Chloride: 101 mmol/L (ref 98–111)
Creatinine, Ser: 1.04 mg/dL — ABNORMAL HIGH (ref 0.44–1.00)
GFR calc Af Amer: 59 mL/min — ABNORMAL LOW (ref >60)
GFR calc non Af Amer: 51 mL/min — ABNORMAL LOW (ref >60)
Glucose, Bld: 102 mg/dL — ABNORMAL HIGH (ref 70–99)
Potassium: 3.7 mmol/L (ref 3.5–5.1)
Sodium: 134 mmol/L — ABNORMAL LOW (ref 135–145)

## 2020-03-19 LAB — CBC
HCT: 41.7 % (ref 36.0–46.0)
Hemoglobin: 13.5 g/dL (ref 12.0–15.0)
MCH: 29.6 pg (ref 26.0–34.0)
MCHC: 32.4 g/dL (ref 30.0–36.0)
MCV: 91.4 fL (ref 80.0–100.0)
Platelets: 282 10*3/uL (ref 150–400)
RBC: 4.56 MIL/uL (ref 3.87–5.11)
RDW: 14.2 % (ref 11.5–15.5)
WBC: 5.2 10*3/uL (ref 4.0–10.5)
nRBC: 0 % (ref 0.0–0.2)

## 2020-03-19 LAB — BRAIN NATRIURETIC PEPTIDE: B Natriuretic Peptide: 198.9 pg/mL — ABNORMAL HIGH (ref 0.0–100.0)

## 2020-03-19 MED ORDER — METOPROLOL SUCCINATE ER 50 MG PO TB24: 50.0000 mg | ORAL_TABLET | Freq: Every day | ORAL | 3 refills | Status: DC

## 2020-03-19 MED ORDER — DAPAGLIFLOZIN PROPANEDIOL 10 MG PO TABS
10.0000 mg | ORAL_TABLET | Freq: Every day | ORAL | 11 refills | Status: DC
Start: 2020-03-19 — End: 2020-03-20

## 2020-03-19 NOTE — Patient Instructions (Signed)
Labs done today. We will contact you only if your labs are abnormal.  DECREASE Metoprolol 50mg (1 tablet) by mouth daily.  START Farxiga 10mg (1 tablet) by mouth daily.  No other medication changes were made. Please continue all other medications as prescribed.  Your physician recommends that you schedule a follow-up appointment in: 2 months with an echo prior to your exam.  If you have any questions or concerns before your next appointment please send [place] a message through [place] or call our office at [phone].    TO LEAVE A MESSAGE FOR THE NURSE SELECT OPTION 2, PLEASE LEAVE A MESSAGE INCLUDING: . YOUR NAME . DATE OF BIRTH . CALL BACK NUMBER . REASON FOR CALL**this is important as we prioritize the call backs  YOU WILL RECEIVE A CALL BACK THE SAME DAY AS LONG AS YOU CALL BEFORE 4:00 PM   Do the following things EVERYDAY: 1) Weigh yourself in the morning before breakfast. Write it down and keep it in a log. 2) Take your medicines as prescribed 3) Eat low salt foods--Limit salt (sodium) to 2000 mg per day.  4) Stay as active as you can everyday 5) Limit all fluids for the day to less than 2 liters   Your physician has requested that you have an echocardiogram. Echocardiography is a painless test that uses sound waves to create images of your heart. It provides your doctor with information about the size and shape of your heart and how well your heart's chambers and valves are working. This procedure takes approximately one hour. There are no restrictions for this procedure.  At the [facility], you and your health needs are our priority. As part of our continuing mission to provide you with exceptional heart care, we have created designated Provider Care Teams. These Care Teams include your primary Cardiologist (physician) and Advanced Practice Providers (APPs- Physician Assistants and Nurse Practitioners) who all work together to provide you with the care you need,  when you need it.   You may see any of the following providers on your designated Care Team at your next follow up: Marland Kitchen Dr Glori Bickers . Dr Loralie Champagne . Darrick Grinder, NP . Lyda Jester, PA . Audry Riles, PharmD   Please be sure to bring in all your medications bottles to every appointment.

## 2020-03-20 ENCOUNTER — Telehealth (HOSPITAL_COMMUNITY): Payer: Self-pay | Admitting: Pharmacy Technician

## 2020-03-20 NOTE — Addendum Note (Signed)
Encounter addended by: Scarlette Calico, RN on: 03/20/2020 10:57 AM  Actions taken: Clinical Note Signed, Order list changed

## 2020-03-20 NOTE — Telephone Encounter (Signed)
Received a notification that the patient needed a PA for [place]. Upon investigating, the patient is on Jardiance and should not be on both [place]. We did discontinue the [place]. Called and informed the patient.  Charlann Boxer, CPhT

## 2020-03-20 NOTE — Progress Notes (Signed)
Per pt's pharmacy, pt has been on Jardiance since Feb, per review of chart this was started 09/12/19 by Audry Riles, pharm D. Pt will continue the jardiance, Farxiga removed from med list.

## 2020-03-21 ENCOUNTER — Telehealth: Payer: Self-pay | Admitting: *Deleted

## 2020-03-21 NOTE — Telephone Encounter (Signed)
° °  [facility] Pre-operative Risk Assessment    HEARTCARE STAFF: - Please ensure there is not already an duplicate clearance open for this procedure. - Under Visit Info/Reason for Call, type in Other and utilize the format Clearance MM/DD/YY or Clearance TBD. Do not use dashes or single digits. - If request is for dental extraction, please clarify the # of teeth to be extracted.  Request for surgical clearance:  1. What type of surgery is being performed? LUMBAR ESI   2. When is this surgery scheduled? 04/03/20   3. What type of clearance is required (medical clearance vs. Pharmacy clearance to hold med vs. Both)? BOTH  4. Are there any medications that need to be held prior to surgery and how long? ELIQUIS x 3 DAYS PRIOR TO INJECTION   5. Practice name and name of physician performing surgery? Marisa Sprinkles; MD IS NOT LISTED   6. What is the office phone number? [phone]   7.   What is the office fax number? [phone]  8.   Anesthesia type (None, local, MAC, general) ? NONE LISTED   Julaine Hua 03/21/2020, 4:18 PM  _________________________________________________________________   (provider comments below)

## 2020-03-24 NOTE — Telephone Encounter (Signed)
   Primary Cardiologist: Fransico Him, MD  Chart reviewed as part of pre-operative protocol coverage. Given past medical history and time since last visit, based on ACC/AHA guidelines, Sara Leach would be at acceptable risk for the planned procedure without further cardiovascular testing.   Patient with diagnosis of afib on Eliquis for anticoagulation.    Procedure: LUMBAR ESI Date of procedure: 04/03/20  CHADS2-VASc score of  5 (CHF, HTN, AGE, AGE, female)  CrCl 51.66ml/min  Per office protocol, patient can hold Eliquis for 3 days prior to procedure.  I will route this recommendation to the requesting party via Epic fax function and remove from pre-op pool.  Please call with questions.  [name]. [name] NP-C    03/24/2020, 7:20 AM [place] [address] Office [phone] Fax [phone]

## 2020-03-24 NOTE — Telephone Encounter (Signed)
Patient with diagnosis of afib on Eliquis for anticoagulation.    Procedure: LUMBAR ESI  Date of procedure: 04/03/20  CHADS2-VASc score of  5 (CHF, HTN, AGE, AGE, female)  CrCl 51.15ml/min  Per office protocol, patient can hold Eliquis for 3 days prior to procedure.

## 2020-03-25 DIAGNOSIS — E559 Vitamin D deficiency, unspecified: Secondary | ICD-10-CM | POA: Diagnosis not present

## 2020-03-25 DIAGNOSIS — E871 Hypo-osmolality and hyponatremia: Secondary | ICD-10-CM | POA: Diagnosis not present

## 2020-03-25 DIAGNOSIS — K219 Gastro-esophageal reflux disease without esophagitis: Secondary | ICD-10-CM | POA: Diagnosis not present

## 2020-03-25 DIAGNOSIS — E039 Hypothyroidism, unspecified: Secondary | ICD-10-CM | POA: Diagnosis not present

## 2020-03-25 IMAGING — MR MR CARD MORPHOLOGY WO/W CM
32 of 34 series · 36 of 40 positions shown · IV contrast (Contrast agent)
Comparison: none

CLINICAL DATA: 78-year-old female with h/o PAF, LBBB, non-ischemic
cardiomyopathy of unknown etiology.

EXAM:
CARDIAC MRI
TECHNIQUE: The patient was scanned on a 1.5 Tesla GE magnet. A dedicated
cardiac coil was used. Functional imaging was done using Fiesta
sequences. [DATE], and 4 chamber views were done to assess for RWMA's.
Modified Calap rule using a short axis stack was used to
calculate an ejection fraction on a dedicated work station using
Circle software. The patient received 8 cc of Gadavist. After 10
minutes inversion recovery sequences were used to assess for
infiltration and scar tissue.
CONTRAST:  8 cc  of Gadavist

[Series 6: bSSFP · oblique · 8.0mm · 1.61mm/px · 1 of 25 slices shown (1 of 19)]
[im 1/25]
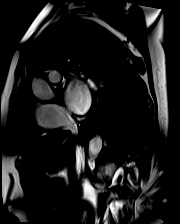

[Series 6: bSSFP · oblique · 8.0mm · 1.61mm/px · 2 of 25 slices shown (2 of 19)]
[im 1/25]
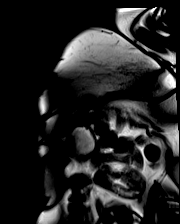
[im 25/25]
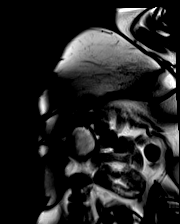

[Series 6: bSSFP · oblique · 8.0mm · 1.61mm/px · 2 of 25 slices shown (3 of 19)]
[im 1/25]
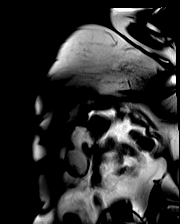
[im 25/25]
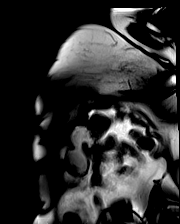

[Series 6: bSSFP · oblique · 8.0mm · 1.61mm/px · 1 of 25 slices shown (4 of 19)]
[im 1/25]
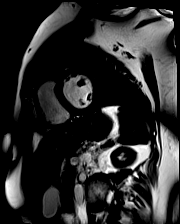

[Series 6: bSSFP · oblique · 8.0mm · 1.61mm/px · 1 of 25 slices shown (5 of 19)]
[im 1/25]
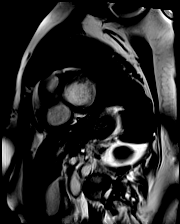

[Series 6: bSSFP · oblique · 8.0mm · 1.61mm/px · 1 of 25 slices shown (6 of 19)]
[im 1/25]
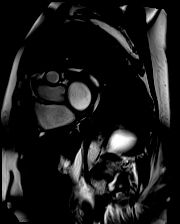

[Series 6: bSSFP · oblique · 8.0mm · 1.61mm/px · 1 of 25 slices shown (7 of 19)]
[im 1/25]
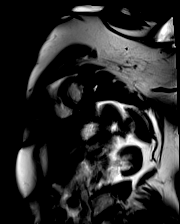

[Series 6: bSSFP · oblique · 8.0mm · 1.61mm/px · 1 of 25 slices shown (8 of 19)]
[im 1/25]
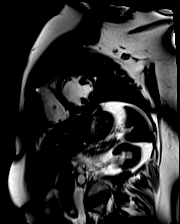

[Series 6: bSSFP · oblique · 8.0mm · 1.61mm/px · 1 of 25 slices shown (9 of 19)]
[im 1/25]
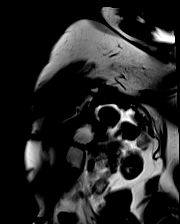

[Series 6: bSSFP · oblique · 8.0mm · 1.61mm/px · 1 of 25 slices shown (10 of 19)]
[im 1/25]
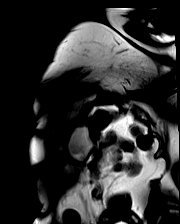

[Series 6: bSSFP · oblique · 8.0mm · 1.61mm/px · 1 of 25 slices shown (11 of 19)]
[im 1/25]
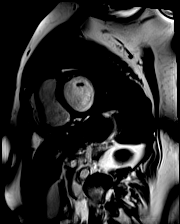

[Series 6: bSSFP · oblique · 8.0mm · 1.61mm/px · 1 of 25 slices shown (12 of 19)]
[im 1/25]
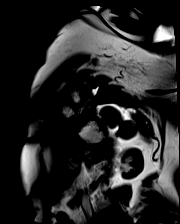

[Series 6: bSSFP · oblique · 8.0mm · 1.61mm/px · 1 of 25 slices shown (13 of 19)]
[im 1/25]
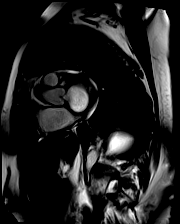

[Series 6: bSSFP · oblique · 8.0mm · 1.61mm/px · 1 of 25 slices shown (14 of 19)]
[im 1/25]
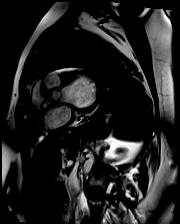

[Series 6: bSSFP · oblique · 8.0mm · 1.61mm/px · 1 of 25 slices shown (15 of 19)]
[im 1/25]
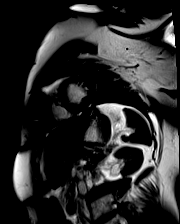

[Series 6: bSSFP · oblique · 8.0mm · 1.61mm/px · 1 of 25 slices shown (16 of 19)]
[im 1/25]
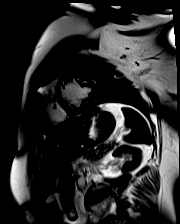

[Series 7: t2_stir_db_sax · oblique · 8.0mm · 1.73mm/px · 1 of 16 slices shown]
[im 1/16]
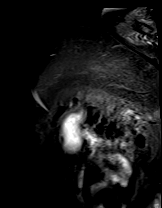

[Series 9: (id)_long_t1 · oblique · 8.0mm · 1.41mm/px · 1 of 24 slices shown]
[im 1/24]
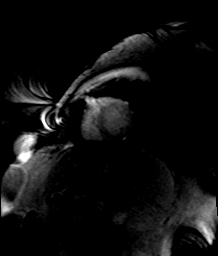

[Series 10: (id)_long_t1_moco · oblique · 8.0mm · 1.41mm/px · 1 of 24 slices shown]
[im 1/24]
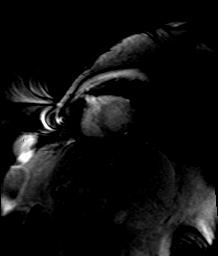

[Series 13: (id)_trufi · oblique · 8.0mm · 1.88mm/px · 1 of 9 slices shown]
[im 1/9]
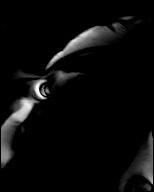

[Series 14: (id)_trufi_moco · oblique · 8.0mm · 1.88mm/px · 1 of 9 slices shown]
[im 1/9]
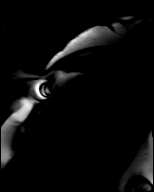

[Series 15: (id)_trufi_moco_t2 · oblique · 8.0mm · 1.88mm/px · 1 of 3 slices shown]
[im 1/3]
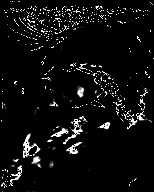

[Series 17: bSSFP · oblique · 6.0mm · 1.41mm/px · 1 of 25 slices shown (17 of 19)]
[im 1/25]
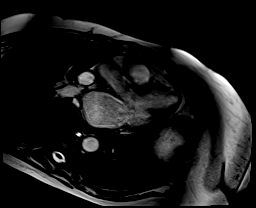

[Series 18: bSSFP · oblique · 6.0mm · 1.41mm/px · 1 of 25 slices shown (18 of 19)]
[im 1/25]
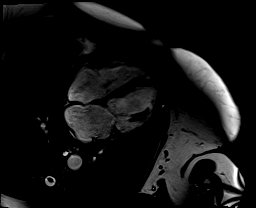

[Series 19: bSSFP · oblique · 6.0mm · 1.41mm/px · 1 of 25 slices shown (19 of 19)]
[im 1/25]
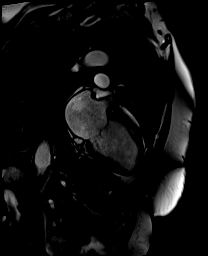

[Series 27: lge_single shot sa · oblique · 8.0mm · 1.88mm/px · 1 of 16 slices shown (1 of 2)]
[im 1/16]
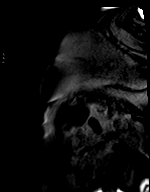

[Series 28: lge_single shot sa · oblique · 8.0mm · 1.88mm/px · 1 of 16 slices shown (2 of 2)]
[im 1/16]
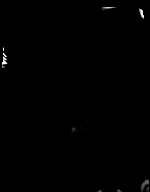

[Series 29: (id)_short_t1 · oblique · 8.0mm · 1.41mm/px · 2 of 27 slices shown]
[im 1/27]
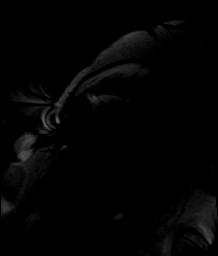
[im 27/27]
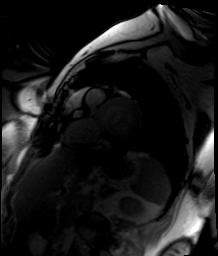

[Series 30: (id)_short_t1_moco · oblique · 8.0mm · 1.41mm/px · 2 of 27 slices shown]
[im 1/27]
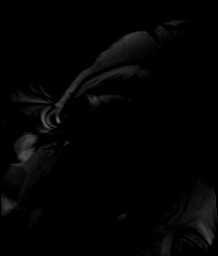
[im 27/27]
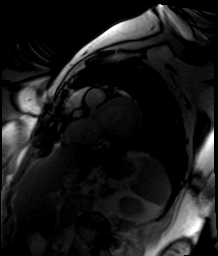

[Series 31: (id)_short_t1_moco_t1 · oblique · 8.0mm · 1.41mm/px · 1 of 6 slices shown]
[im 1/6]
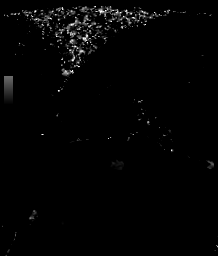

[Series 35: lge short axis_mag · oblique · 8.0mm · 1.61mm/px · 1 of 16 slices shown]
[im 1/16]
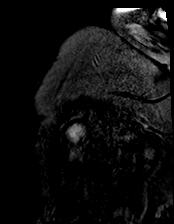

[Series 36: lge short axis_psir · oblique · 8.0mm · 1.61mm/px · 1 of 16 slices shown]
[im 1/16]
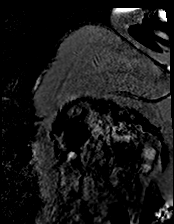

[36 of 40 positions shown; findings below may reference images not displayed]

FINDINGS: 1. Normal left ventricular size, thickness and severely decreased
systolic function (LVEF = 33%). There is diffuse hypokinesis and no
late gadolinium enhancement in the left ventricular myocardium. ECV
is elevated at 32%. (T1 pre contrast: 3732 ms, T1 post contrast 3545
ms).

LVEDD: 52 mm

LVESD: 40 mm

LVEDV: 117 ml

LVESV: 79 ml

SV: 38 ml

CO: 3.1 L/min

Myocardial mass: 90 g

2. Normal right ventricular size, thickness and systolic function
(LVEF = 52%). There are no regional wall motion abnormalities.

RVEDV: 76 ml

RVESV: 37 ml

SV: 39 ml

3.  Severe left atrial and moderate right atrial size.

4. Normal size of the aortic root, ascending aorta and pulmonary
artery.

5.  Mild mitral and mild to moderate tricuspid regurgitation.

6.  Normal pericardium.  No pericardial effusion.
IMPRESSION: 1. Normal left ventricular size, thickness and severely decreased
systolic function (LVEF = 33%). There is diffuse hypokinesis and no
late gadolinium enhancement in the left ventricular myocardium. ECV
is elevated at 32%. (T1 pre contrast: 3732 ms, T1 post contrast 3545
ms).

These findings are consistent with non-ischemic cardiomyopathy
possibly secondary to atrial fibrillation.

2. Normal right ventricular size, thickness and systolic function
(LVEF = 52%). There are no regional wall motion abnormalities.

3. Severe left atrial and moderate right atrial size.

4. Mild mitral and mild to moderate tricuspid regurgitation.

## 2020-03-28 DIAGNOSIS — Z0001 Encounter for general adult medical examination with abnormal findings: Secondary | ICD-10-CM | POA: Diagnosis not present

## 2020-03-28 DIAGNOSIS — I10 Essential (primary) hypertension: Secondary | ICD-10-CM | POA: Diagnosis not present

## 2020-03-28 DIAGNOSIS — I5022 Chronic systolic (congestive) heart failure: Secondary | ICD-10-CM | POA: Diagnosis not present

## 2020-03-28 DIAGNOSIS — E039 Hypothyroidism, unspecified: Secondary | ICD-10-CM | POA: Diagnosis not present

## 2020-04-03 DIAGNOSIS — M5136 Other intervertebral disc degeneration, lumbar region: Secondary | ICD-10-CM | POA: Diagnosis not present

## 2020-04-08 DIAGNOSIS — I480 Paroxysmal atrial fibrillation: Secondary | ICD-10-CM | POA: Diagnosis not present

## 2020-04-08 DIAGNOSIS — E876 Hypokalemia: Secondary | ICD-10-CM | POA: Diagnosis not present

## 2020-04-08 DIAGNOSIS — I110 Hypertensive heart disease with heart failure: Secondary | ICD-10-CM | POA: Diagnosis not present

## 2020-04-08 DIAGNOSIS — I5022 Chronic systolic (congestive) heart failure: Secondary | ICD-10-CM | POA: Diagnosis not present

## 2020-04-10 DIAGNOSIS — D3132 Benign neoplasm of left choroid: Secondary | ICD-10-CM | POA: Diagnosis not present

## 2020-04-10 DIAGNOSIS — H0102A Squamous blepharitis right eye, upper and lower eyelids: Secondary | ICD-10-CM | POA: Diagnosis not present

## 2020-04-10 DIAGNOSIS — H0102B Squamous blepharitis left eye, upper and lower eyelids: Secondary | ICD-10-CM | POA: Diagnosis not present

## 2020-04-10 DIAGNOSIS — H18592 Other hereditary corneal dystrophies, left eye: Secondary | ICD-10-CM | POA: Diagnosis not present

## 2020-04-28 DIAGNOSIS — M5136 Other intervertebral disc degeneration, lumbar region: Secondary | ICD-10-CM | POA: Diagnosis not present

## 2020-04-28 DIAGNOSIS — M5134 Other intervertebral disc degeneration, thoracic region: Secondary | ICD-10-CM | POA: Diagnosis not present

## 2020-05-07 ENCOUNTER — Other Ambulatory Visit (HOSPITAL_COMMUNITY): Payer: Self-pay | Admitting: Internal Medicine

## 2020-05-19 ENCOUNTER — Other Ambulatory Visit: Payer: Self-pay

## 2020-05-19 ENCOUNTER — Ambulatory Visit (HOSPITAL_BASED_OUTPATIENT_CLINIC_OR_DEPARTMENT_OTHER)
Admission: RE | Admit: 2020-05-19 | Discharge: 2020-05-19 | Disposition: A | Discharge status: Home / Self Care | Payer: Medicare Other | Source: Ambulatory Visit

## 2020-05-19 ENCOUNTER — Ambulatory Visit (HOSPITAL_COMMUNITY)
Admission: RE | Admit: 2020-05-19 | Discharge: 2020-05-19 | Disposition: A | Discharge status: Home / Self Care | Payer: Medicare Other | Source: Ambulatory Visit | Attending: Internal Medicine | Admitting: Internal Medicine

## 2020-05-19 VITALS — BP 122/78 | HR 88 | Wt 216.1 lb

## 2020-05-19 DIAGNOSIS — I10 Essential (primary) hypertension: Secondary | ICD-10-CM

## 2020-05-19 DIAGNOSIS — M1990 Unspecified osteoarthritis, unspecified site: Secondary | ICD-10-CM | POA: Diagnosis not present

## 2020-05-19 DIAGNOSIS — I5022 Chronic systolic (congestive) heart failure: Secondary | ICD-10-CM | POA: Diagnosis not present

## 2020-05-19 DIAGNOSIS — E669 Obesity, unspecified: Secondary | ICD-10-CM | POA: Insufficient documentation

## 2020-05-19 DIAGNOSIS — R5383 Other fatigue: Secondary | ICD-10-CM | POA: Insufficient documentation

## 2020-05-19 DIAGNOSIS — K219 Gastro-esophageal reflux disease without esophagitis: Secondary | ICD-10-CM | POA: Insufficient documentation

## 2020-05-19 DIAGNOSIS — Z79899 Other long term (current) drug therapy: Secondary | ICD-10-CM | POA: Insufficient documentation

## 2020-05-19 DIAGNOSIS — Z7901 Long term (current) use of anticoagulants: Secondary | ICD-10-CM | POA: Diagnosis not present

## 2020-05-19 DIAGNOSIS — I110 Hypertensive heart disease with heart failure: Secondary | ICD-10-CM | POA: Diagnosis not present

## 2020-05-19 DIAGNOSIS — E039 Hypothyroidism, unspecified: Secondary | ICD-10-CM | POA: Diagnosis not present

## 2020-05-19 DIAGNOSIS — Z8249 Family history of ischemic heart disease and other diseases of the circulatory system: Secondary | ICD-10-CM | POA: Insufficient documentation

## 2020-05-19 DIAGNOSIS — E785 Hyperlipidemia, unspecified: Secondary | ICD-10-CM | POA: Insufficient documentation

## 2020-05-19 DIAGNOSIS — I4819 Other persistent atrial fibrillation: Secondary | ICD-10-CM | POA: Insufficient documentation

## 2020-05-19 DIAGNOSIS — R0683 Snoring: Secondary | ICD-10-CM | POA: Insufficient documentation

## 2020-05-19 DIAGNOSIS — I428 Other cardiomyopathies: Secondary | ICD-10-CM | POA: Insufficient documentation

## 2020-05-19 DIAGNOSIS — G4733 Obstructive sleep apnea (adult) (pediatric): Secondary | ICD-10-CM | POA: Diagnosis not present

## 2020-05-19 LAB — ECHOCARDIOGRAM COMPLETE
AR max vel: 2.24 cm2
AV Area VTI: 2.21 cm2
AV Area mean vel: 2.3 cm2
AV Mean grad: 4 mmHg
AV Peak grad: 8.6 mmHg
Ao pk vel: 1.46 m/s
S' Lateral: 3.4 cm

## 2020-05-19 NOTE — Patient Instructions (Signed)
Please call our office in July 2022 to schedule your follow up appointment  If you have any questions or concerns before your next appointment please send [place] a message through [place] or call our office at [phone].    TO LEAVE A MESSAGE FOR THE NURSE SELECT OPTION 2, PLEASE LEAVE A MESSAGE INCLUDING: . YOUR NAME . DATE OF BIRTH . CALL BACK NUMBER . REASON FOR CALL**this is important as we prioritize the call backs  YOU WILL RECEIVE A CALL BACK THE SAME DAY AS LONG AS YOU CALL BEFORE 4:00 PM

## 2020-05-19 NOTE — Progress Notes (Signed)
  Echocardiogram 2D Echocardiogram has been performed.  Sara Leach 05/19/2020, 1:46 PM

## 2020-05-19 NOTE — Progress Notes (Signed)
ADVANCED HF CLINIC  NOTE  Referring Physician: Brudine Primary Care: Dr. Dalia Heading Primary Cardiologist: New  HPI:  Sara Leach is a 79 y/o woman with h/o obesity, breast CA (2009) - s/p XRT and partial mastectomy (no chemo), HTN , HL who is referred by Dr. Robin Searing for further evaluation of recent onset AF and systolic HF.   First found to be in AF during regular physical exam in 4/20. In June got echo at [facility] showed EF 40% with anterior and AS HK, mild MR/TR. + marked biatrial enlargement. Started on lisinopril and Toprol and Eliquis. Referred for further eval.    We saw her for the first time in 7/20 and repeat echo .with EF with anterior and anteroseptal WMA. Underwent cath as below with normal coronary arteries. EF 30-35%. Normal RHC  Saw Dr. Caryl Comes. Decided against ICD.   F/u echo 5/21 EF 45% (I felt 35-40%)  Here for routine f/u. At last visit Toprol decreased due to worsening fatigue. Feels a bit better. Very active around the house. Denies CP, edema, orthopnea or PND. On Eliquis. No bleeding.   Echo today 05/19/20 EF 45% RV ok. Personally reviewed   Cardiac studies:  Zio in 1/21 1. Continuous atrial fibrillation (100% burden) - ventricular rates ranging from 54- 165 bpm (avg of 97 bpm).  2. Rare PVCs (< 1.0%)   Zio placed to assess adequacy of rate control with chronic AF (10/20) 1. Continuous atrial fibrillation rates ranging from 58-181 bpm (avg of 100 bpm).  2. Rare PVCs (< 1.0%)  Sleep study  AHI 8.5/hr   cMRI 07/20/19 1. Normal left ventricular size, thickness and severely decreased systolic function (LVEF = 33%). There is diffuse hypokinesis and no late gadolinium enhancement in the left ventricular myocardium. ECV is elevated at 32%. (T1 pre contrast: 1012 ms, T1 post contrast 1762 Ms). 2. RV is normal   Echo 10/20 30-35%  Cath 03/07/19  Ao = 135/79 (103) LV =  140/7 RA =  3  RV =  26/6 PA =  27/9 (19) PCW = 9 Fick cardiac  output/index = 5.0/2.4 PVR = 2.0 WU SVR = 1587 Ao sat = 100% PA sat = 68%, 71%  Assessment: 1. Normal coronaries 2. NICM EF 30-35% 3. Well-compensated hemodynamics. No evidence of PAH   Past Medical History:  Diagnosis Date  . Atrial fibrillation ([place]) 01/2019  . Breast cancer ([place])   . Cataracts, bilateral   . GERD (gastroesophageal reflux disease)   . Hyperlipidemia   . Hypertension   . Hyperthyroidism   . Obesity   . Osteoarthritis   . Systolic heart failure ([place]) 01/2019   EF 40% by echo done 01/17/2019    Current Outpatient Medications  Medication Sig Dispense Refill  . acetaminophen (TYLENOL) 500 MG tablet Take 500 mg by mouth 2 (two) times a day.    Marland Kitchen apixaban (ELIQUIS) 5 MG TABS tablet Take 5 mg by mouth 2 (two) times daily.     . cephALEXin (KEFLEX) 500 MG capsule TAKE FOUR CAPSULES BY MOUTH ONE HOUR PRIOR TO ALL DENTAL APPOINTMENTS    . empagliflozin (JARDIANCE) 10 MG TABS tablet Take 10 mg by mouth daily before breakfast. 30 tablet 11  . ENTRESTO 24-26 MG TAKE 1 TABLET BY MOUTH TWICE DAILY. 180 tablet 3  . Famotidine (PEPCID AC MAXIMUM STRENGTH) 20 MG CHEW Chew 1 tablet by mouth at bedtime.    . furosemide (LASIX) 40 MG tablet TAKE (1) TABLET BY MOUTH EVERY MONDAY,  [facility] AND FRIDAY. 14 tablet 0  . hydrochlorothiazide (HYDRODIURIL) 25 MG tablet Take 25 mg by mouth daily.    Marland Kitchen levothyroxine (SYNTHROID) 75 MCG tablet Take 75 mcg by mouth daily before breakfast.    . Liniments (PAIN RELIEF EX) Apply 1 application topically 3 (three) times daily as needed (back pain.). [place] Gel - White Active Ingredients  Camphor 3.00% Menthol 5.00%    . metoprolol succinate (TOPROL-XL) 50 MG 24 hr tablet Take 1 tablet (50 mg total) by mouth at bedtime. Take with or immediately following a meal. 90 tablet 3  . Multiple Vitamins-Minerals (EMERGEN-C VITAMIN C PO) Take by mouth.     No current facility-administered medications for this encounter.    No Known Allergies      Social History   Socioeconomic History  . Marital status: Married    Spouse name: Not on file  . Number of children: Not on file  . Years of education: Not on file  . Highest education level: Not on file  Occupational History  . Not on file  Tobacco Use  . Smoking status: Never Smoker  . Smokeless tobacco: Never Used  Substance and Sexual Activity  . Alcohol use: Yes    Comment: occasionally has wine  . Drug use: Never  . Sexual activity: Not on file  Other Topics Concern  . Not on file  Social History Narrative  . Not on file   Social Determinants of Health   Financial Resource Strain:   . Difficulty of Paying Living Expenses: Not on file  Food Insecurity:   . Worried About Charity fundraiser in the Last Year: Not on file  . Ran Out of Food in the Last Year: Not on file  Transportation Needs:   . Lack of Transportation (Medical): Not on file  . Lack of Transportation (Non-Medical): Not on file  Physical Activity:   . Days of Exercise per Week: Not on file  . Minutes of Exercise per Session: Not on file  Stress:   . Feeling of Stress : Not on file  Social Connections:   . Frequency of Communication with Friends and Family: Not on file  . Frequency of Social Gatherings with Friends and Family: Not on file  . Attends Religious Services: Not on file  . Active Member of Clubs or Organizations: Not on file  . Attends Archivist Meetings: Not on file  . Marital Status: Not on file  Intimate Partner Violence:   . Fear of Current or Ex-Partner: Not on file  . Emotionally Abused: Not on file  . Physically Abused: Not on file  . Sexually Abused: Not on file      Family History  Problem Relation Age of Onset  . Ovarian cancer Mother   . Coronary artery disease Father   . AAA (abdominal aortic aneurysm) Father 80       cause of death  . Alzheimer's disease Sister   . Alzheimer's disease Brother   . Heart defect Son        VSD, casued death at 11 m old  .  Alzheimer's disease Brother   . Alzheimer's disease Brother     Vitals:   05/19/20 1353  BP: 122/78  Pulse: 88  SpO2: 99%  Weight: 98 kg (216 lb 2 oz)    PHYSICAL EXAM: General:  Well appearing. No resp difficulty HEENT: normal Neck: supple. no JVD. Carotids 2+ bilat; no bruits. No lymphadenopathy or thryomegaly appreciated. Cor: PMI  nondisplaced. Irregular rate & rhythm. No rubs, gallops or murmurs. Lungs: clear Abdomen: obese soft, nontender, nondistended. No hepatosplenomegaly. No bruits or masses. Good bowel sounds. Extremities: no cyanosis, clubbing, rash, tr edema L >R Neuro: alert & orientedx3, cranial nerves grossly intact. moves all 4 extremities w/o difficulty. Affect pleasant   ECG: AF 83. LAFB IVCD 186ms Personally reviewed  ASSESSMENT & PLAN:  1. Chronic systolic HF due to NICM - Echo 6/20 [facility] EF 40% with anterior and AS HK, mild MR/TR. + marked biatrial enlargement.  - Echo 7/20 EF 30% with anterior and AS HK, mild MR/TR + marked biatrial enlargement. - Cath 7/20 No CAD. EF 30-35% - Echo 10/20  EF 30-35% - cMRI 12/20. LVEF 33%. No LGE - Echo 5/21 EF 45% (I felt 35-40%) - Echo today 05/19/20 EF 45-50% RV ok. Personally reviewed - Doing well. Improved NYHA II - Volume status looks good  - Continue Entresto 49/51 bid  - Toprol decreased to 50 mg daily to see if helps with fatigue - Continue lasix to 40 mg M/W/F.  - Continue Jardiance 10 - Off spiro due to low BP  - BP too low to titrate GDMT - Etiology of CM remains unclear. Have suspected this may be due to AF with suboptimal rate control. Recent zio reviewed. Mean VR 97 bpm. - Has seen Dr. Caryl Comes. He was not eager about ICD but felt she might need AVN ablation to control AF.  - I suspect worsening symptoms related to combination of HF and high-dose b-blocker. Will cut b-blocker but suspect rate will go back up > 100. I think she is heading toward AVN ablation to control her AF and possibly allow for  LV recovery.    2. Persistent AF - by echo report seems like she may have restrictive CM with severe biatrial enlargement. Suspect this may be cause of AF - Continue Apixaban. No bleeding - Etiology of CM remains unclear. Have suspected this may be due to AF with suboptimal rate control. Recent zio reviewed. Mean VR 97 bpm.  - HR now well controlled. Continue current regime - Has seen Dr. Caryl Comes. He was not eager about ICD but felt she might need AVN ablation to control AF.   3. HTN - BP stable   4. Fatigue/snoring - Sleep study complete. Mild OSA 8.5.  - Suggest weight loss   - No change    Glori Bickers, MD  2:17 PM

## 2020-05-19 NOTE — Addendum Note (Signed)
Encounter addended by: Malena Edman, RN on: 05/19/2020 2:40 PM  Actions taken: Clinical Note Signed

## 2020-06-07 DIAGNOSIS — I110 Hypertensive heart disease with heart failure: Secondary | ICD-10-CM | POA: Diagnosis not present

## 2020-06-07 DIAGNOSIS — I480 Paroxysmal atrial fibrillation: Secondary | ICD-10-CM | POA: Diagnosis not present

## 2020-06-07 DIAGNOSIS — I5022 Chronic systolic (congestive) heart failure: Secondary | ICD-10-CM | POA: Diagnosis not present

## 2020-06-09 ENCOUNTER — Other Ambulatory Visit (HOSPITAL_COMMUNITY): Payer: Self-pay | Admitting: Internal Medicine

## 2020-06-30 DIAGNOSIS — I10 Essential (primary) hypertension: Secondary | ICD-10-CM | POA: Diagnosis not present

## 2020-06-30 DIAGNOSIS — E039 Hypothyroidism, unspecified: Secondary | ICD-10-CM | POA: Diagnosis not present

## 2020-06-30 DIAGNOSIS — N183 Chronic kidney disease, stage 3 unspecified: Secondary | ICD-10-CM | POA: Diagnosis not present

## 2020-06-30 DIAGNOSIS — Z13220 Encounter for screening for lipoid disorders: Secondary | ICD-10-CM | POA: Diagnosis not present

## 2020-07-08 ENCOUNTER — Other Ambulatory Visit (HOSPITAL_COMMUNITY): Payer: Self-pay | Admitting: Internal Medicine

## 2020-07-08 DIAGNOSIS — I5022 Chronic systolic (congestive) heart failure: Secondary | ICD-10-CM | POA: Diagnosis not present

## 2020-07-08 DIAGNOSIS — I110 Hypertensive heart disease with heart failure: Secondary | ICD-10-CM | POA: Diagnosis not present

## 2020-07-08 DIAGNOSIS — I480 Paroxysmal atrial fibrillation: Secondary | ICD-10-CM | POA: Diagnosis not present

## 2020-07-15 DIAGNOSIS — E039 Hypothyroidism, unspecified: Secondary | ICD-10-CM | POA: Diagnosis not present

## 2020-07-15 DIAGNOSIS — I480 Paroxysmal atrial fibrillation: Secondary | ICD-10-CM | POA: Diagnosis not present

## 2020-07-15 DIAGNOSIS — I5022 Chronic systolic (congestive) heart failure: Secondary | ICD-10-CM | POA: Diagnosis not present

## 2020-07-15 DIAGNOSIS — I10 Essential (primary) hypertension: Secondary | ICD-10-CM | POA: Diagnosis not present

## 2020-08-05 ENCOUNTER — Other Ambulatory Visit (HOSPITAL_COMMUNITY): Payer: Self-pay | Admitting: Internal Medicine

## 2020-08-08 DIAGNOSIS — I110 Hypertensive heart disease with heart failure: Secondary | ICD-10-CM | POA: Diagnosis not present

## 2020-08-08 DIAGNOSIS — I5022 Chronic systolic (congestive) heart failure: Secondary | ICD-10-CM | POA: Diagnosis not present

## 2020-08-08 DIAGNOSIS — E876 Hypokalemia: Secondary | ICD-10-CM | POA: Diagnosis not present

## 2020-08-08 DIAGNOSIS — I480 Paroxysmal atrial fibrillation: Secondary | ICD-10-CM | POA: Diagnosis not present

## 2020-08-11 DIAGNOSIS — R0981 Nasal congestion: Secondary | ICD-10-CM | POA: Diagnosis not present

## 2020-08-12 ENCOUNTER — Telehealth: Payer: Self-pay | Admitting: Nurse Practitioner

## 2020-08-12 ENCOUNTER — Other Ambulatory Visit: Payer: Self-pay | Admitting: Unknown Physician Specialty

## 2020-08-12 DIAGNOSIS — I4891 Unspecified atrial fibrillation: Secondary | ICD-10-CM

## 2020-08-12 DIAGNOSIS — U071 COVID-19: Secondary | ICD-10-CM

## 2020-08-12 NOTE — Telephone Encounter (Signed)
Called to Discuss with patient about Covid symptoms and the use of the monoclonal antibody infusion for those with mild to moderate Covid symptoms and at a high risk of hospitalization.     Pt appears to qualify for this infusion due to co-morbid conditions and/or a member of an at-risk group in accordance with the FDA Emergency Use Authorization.   Symptom onset: 08/10/20 (Pending positive results)  Vaccinated: Yes  Qualified for Infusion: A-fib, CHF, advanced age, BMI   Patient would be a good candidate for Sotrovimab. Unable to transport for Remdesivir, contraindications for oral tx. Granddaughter is main contact and is aware someone will call to further schedule once qualified)  Sara Alma, NP [facility]  [phone]

## 2020-08-12 NOTE — Progress Notes (Signed)
I connected by phone with Sara Leach's grandaughter on 08/12/2020 at 11:29 AM to discuss the potential use of a new treatment for mild to moderate COVID-19 viral infection in non-hospitalized patients.  This patient is a 80 y.o. female that meets the FDA criteria for Emergency Use Authorization of COVID monoclonal antibody casirivimab/imdevimab, bamlanivimab/etesevimab, or sotrovimab.  Has a (+) direct SARS-CoV-2 viral test result  Has mild or moderate COVID-19   Is NOT hospitalized due to COVID-19  Is within 10 days of symptom onset  Has at least one of the high risk factor(s) for progression to severe COVID-19 and/or hospitalization as defined in EUA.  Specific high risk criteria : Older age (>/= 80 yo) and Cardiovascular disease or hypertension   I have spoken and communicated the following to the patient or parent/caregiver regarding COVID monoclonal antibody treatment:  1. FDA has authorized the emergency use for the treatment of mild to moderate COVID-19 in adults and pediatric patients with positive results of direct SARS-CoV-2 viral testing who are 94 years of age and older weighing at least 40 kg, and who are at high risk for progressing to severe COVID-19 and/or hospitalization.  2. The significant known and potential risks and benefits of COVID monoclonal antibody, and the extent to which such potential risks and benefits are unknown.  3. Information on available alternative treatments and the risks and benefits of those alternatives, including clinical trials.  4. Patients treated with COVID monoclonal antibody should continue to self-isolate and use infection control measures (e.g., wear mask, isolate, social distance, avoid sharing personal items, clean and disinfect "high touch" surfaces, and frequent handwashing) according to CDC guidelines.   5. The patient or parent/caregiver has the option to accept or refuse COVID monoclonal antibody treatment.  After  reviewing this information with the patient's grand daughter, the patient has agreed to receive one of the available covid 19 monoclonal antibodies and will be provided an appropriate fact sheet prior to infusion. Gabriel Cirri, NP 08/12/2020 11:29 AM  Fully vaccinated and boosted: Paxlovid contraindicated  Needs a positive test

## 2020-08-13 ENCOUNTER — Ambulatory Visit (HOSPITAL_COMMUNITY): Payer: Medicare Other

## 2020-09-05 ENCOUNTER — Other Ambulatory Visit (HOSPITAL_COMMUNITY): Payer: Self-pay | Admitting: Internal Medicine

## 2020-09-06 DIAGNOSIS — I5022 Chronic systolic (congestive) heart failure: Secondary | ICD-10-CM | POA: Diagnosis not present

## 2020-09-06 DIAGNOSIS — I110 Hypertensive heart disease with heart failure: Secondary | ICD-10-CM | POA: Diagnosis not present

## 2020-09-06 DIAGNOSIS — E876 Hypokalemia: Secondary | ICD-10-CM | POA: Diagnosis not present

## 2020-09-06 DIAGNOSIS — I480 Paroxysmal atrial fibrillation: Secondary | ICD-10-CM | POA: Diagnosis not present

## 2020-10-08 ENCOUNTER — Telehealth (HOSPITAL_COMMUNITY): Payer: Self-pay | Admitting: Pharmacy Technician

## 2020-10-08 ENCOUNTER — Other Ambulatory Visit (HOSPITAL_COMMUNITY): Payer: Self-pay | Admitting: Internal Medicine

## 2020-10-08 NOTE — Telephone Encounter (Addendum)
Received a message from the patient's daughter Raquel Sarna regarding her mom's PAN grant. The grant has expired and currently there are no grant options available. I confirmed that the patient could afford the $37 (30 day) co-pay. I advised the patient's daughter that if the medication becomes unaffordable, we could try assistance with [employer]. [employer] takes into account the patient's income and what is spent out of pocket for the year when making a determination. I am not sure if she would be approved.  Advised her that we could do the same for Jardiance.   Placed patient on the PAN HF wait list. Wait List ID: [id number]  Charlann Boxer, CPhT

## 2020-10-17 ENCOUNTER — Telehealth (HOSPITAL_COMMUNITY): Payer: Self-pay | Admitting: *Deleted

## 2020-10-17 NOTE — Telephone Encounter (Signed)
Received fax from [facility], pt needs clearance to hold Eliquis for lumbar injection  Per Dr Haroldine Laws ok to hold as needed  Note faxed back to them at [phone]

## 2020-11-04 DIAGNOSIS — M5416 Radiculopathy, lumbar region: Secondary | ICD-10-CM | POA: Diagnosis not present

## 2020-11-19 DIAGNOSIS — M5416 Radiculopathy, lumbar region: Secondary | ICD-10-CM | POA: Diagnosis not present

## 2020-12-06 DIAGNOSIS — I110 Hypertensive heart disease with heart failure: Secondary | ICD-10-CM | POA: Diagnosis not present

## 2020-12-06 DIAGNOSIS — E876 Hypokalemia: Secondary | ICD-10-CM | POA: Diagnosis not present

## 2020-12-06 DIAGNOSIS — I5022 Chronic systolic (congestive) heart failure: Secondary | ICD-10-CM | POA: Diagnosis not present

## 2020-12-06 DIAGNOSIS — I480 Paroxysmal atrial fibrillation: Secondary | ICD-10-CM | POA: Diagnosis not present

## 2021-01-06 ENCOUNTER — Other Ambulatory Visit (HOSPITAL_COMMUNITY): Payer: Self-pay | Admitting: Internal Medicine

## 2021-01-07 ENCOUNTER — Telehealth (HOSPITAL_COMMUNITY): Payer: Self-pay | Admitting: Internal Medicine

## 2021-01-07 MED ORDER — ENTRESTO 24-26 MG PO TABS: 1.0000 | ORAL_TABLET | Freq: Two times a day (BID) | ORAL | 3 refills | Status: DC

## 2021-01-07 NOTE — Telephone Encounter (Signed)
Pt stated the pharmacy has sent over a request for refill, pt has ran out of Entresto 24-26, please send script to [facility].  Thanks

## 2021-01-07 NOTE — Telephone Encounter (Signed)
Done

## 2021-01-08 DIAGNOSIS — E559 Vitamin D deficiency, unspecified: Secondary | ICD-10-CM | POA: Diagnosis not present

## 2021-01-08 DIAGNOSIS — E039 Hypothyroidism, unspecified: Secondary | ICD-10-CM | POA: Diagnosis not present

## 2021-01-08 DIAGNOSIS — R5383 Other fatigue: Secondary | ICD-10-CM | POA: Diagnosis not present

## 2021-01-08 DIAGNOSIS — Z13220 Encounter for screening for lipoid disorders: Secondary | ICD-10-CM | POA: Diagnosis not present

## 2021-01-12 DIAGNOSIS — E039 Hypothyroidism, unspecified: Secondary | ICD-10-CM | POA: Diagnosis not present

## 2021-01-12 DIAGNOSIS — Z0001 Encounter for general adult medical examination with abnormal findings: Secondary | ICD-10-CM | POA: Diagnosis not present

## 2021-01-12 DIAGNOSIS — I10 Essential (primary) hypertension: Secondary | ICD-10-CM | POA: Diagnosis not present

## 2021-01-12 DIAGNOSIS — I5022 Chronic systolic (congestive) heart failure: Secondary | ICD-10-CM | POA: Diagnosis not present

## 2021-02-11 ENCOUNTER — Encounter (HOSPITAL_COMMUNITY): Payer: Medicare Other | Admitting: Internal Medicine

## 2021-02-16 ENCOUNTER — Ambulatory Visit (HOSPITAL_COMMUNITY)
Admission: RE | Admit: 2021-02-16 | Discharge: 2021-02-16 | Disposition: A | Discharge status: Home / Self Care | Payer: Medicare Other | Source: Ambulatory Visit | Attending: Internal Medicine | Admitting: Internal Medicine

## 2021-02-16 ENCOUNTER — Other Ambulatory Visit: Payer: Self-pay

## 2021-02-16 VITALS — BP 106/62 | HR 87 | Ht 66.0 in | Wt 218.0 lb

## 2021-02-16 DIAGNOSIS — R0683 Snoring: Secondary | ICD-10-CM

## 2021-02-16 DIAGNOSIS — I10 Essential (primary) hypertension: Secondary | ICD-10-CM

## 2021-02-16 DIAGNOSIS — I4819 Other persistent atrial fibrillation: Secondary | ICD-10-CM

## 2021-02-16 DIAGNOSIS — I5022 Chronic systolic (congestive) heart failure: Secondary | ICD-10-CM

## 2021-02-16 NOTE — Progress Notes (Signed)
ADVANCED HF CLINIC  NOTE  Referring Physician: Brudine Primary Care: Dr. Dalia Heading Primary Cardiologist: Glori Bickers  HPI:  Sara Leach is a 80 y/o woman with h/o obesity, breast CA (2009) - s/p XRT and partial mastectomy (no chemo), HTN , HL who is referred by Dr. Robin Searing for further evaluation of recent onset AF and systolic HF.   First found to be in AF during regular physical exam in 4/20. In June got echo at [facility] showed EF 40% with anterior and AS HK, mild MR/TR. + marked biatrial enlargement. Started on lisinopril and Toprol and Eliquis. Referred for further eval.    We saw her for the first time in 7/20 and repeat echo .with EF with anterior and anteroseptal WMA. Underwent cath as below with normal coronary arteries. EF 30-35%. Normal RHC  Saw Dr. Caryl Comes. Decided against ICD.   F/u echo 5/21 EF 45% (I felt 35-40%)  Last visit doing well. Very active around the house. Denies CP, edema, orthopnea or PND. On Eliquis. No bleeding.  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  No dyspnea on exertion, walks with a cane due to back pain.  Denies chest pain, orthopnea, PND.  Not checking bp at home, HR good today at 87.  Has not been weighing herself, taking lasix 3x per week doesn't feel like she is retaining fluid. No palpitations. Rare dizziness if she gets up too quickly.   Echo  05/19/20 EF 45% RV ok. Personally reviewed   Cardiac studies:  Zio in 1/21 1. Continuous atrial fibrillation (100% burden) - ventricular rates ranging from 54- 165 bpm (avg of 97 bpm).  2. Rare PVCs (< 1.0%)   Zio placed to assess adequacy of rate control with chronic AF (10/20) 1. Continuous atrial fibrillation rates ranging from 58-181 bpm (avg of 100 bpm).  2. Rare PVCs (< 1.0%)  Sleep study  AHI 8.5/hr   cMRI 07/20/19 1. Normal left ventricular size, thickness and severely  decreased systolic function (LVEF = 33%). There is diffuse hypokinesis and no late gadolinium enhancement in the left ventricular myocardium. ECV is elevated at 32%. (T1 pre contrast: 1012 ms, T1 post contrast 1762 Ms). 2. RV is normal   Echo 10/20 30-35%  Cath 03/07/19  Ao = 135/79 (103) LV =  140/7 RA =  3  RV =  26/6 PA =  27/9 (19) PCW = 9 Fick cardiac output/index = 5.0/2.4 PVR = 2.0 WU SVR = 1587 Ao sat = 100% PA sat = 68%, 71%   Assessment: 1. Normal coronaries 2. NICM EF 30-35% 3. Well-compensated hemodynamics. No evidence of [place]   Past Medical History:  Diagnosis Date   Atrial fibrillation ([place]) 01/2019   Breast cancer ([place])    Cataracts, bilateral    GERD (gastroesophageal reflux disease)    Hyperlipidemia    Hypertension    Hyperthyroidism    Obesity    Osteoarthritis    Systolic heart failure ([place]) 01/2019   EF 40% by echo done 01/17/2019    Current Outpatient Medications  Medication Sig Dispense Refill   acetaminophen (TYLENOL) 500 MG tablet Take 500 mg by mouth 2 (two) times a day.     apixaban (ELIQUIS) 5 MG TABS tablet Take 5 mg by mouth 2 (two) times daily.      cephALEXin (KEFLEX) 500 MG capsule TAKE FOUR CAPSULES BY MOUTH ONE HOUR PRIOR TO ALL DENTAL APPOINTMENTS  dapagliflozin propanediol (FARXIGA) 10 MG TABS tablet Farxiga 10 mg tablet     empagliflozin (JARDIANCE) 10 MG TABS tablet Take 1 tablet (10 mg total) by mouth daily before breakfast. 30 tablet 4   Famotidine (PEPCID AC MAXIMUM STRENGTH) 20 MG CHEW Chew 1 tablet by mouth at bedtime.     furosemide (LASIX) 40 MG tablet TAKE (1) TABLET BY MOUTH EVERY MONDAY, WEDNESDAY AND FRIDAY. 12 tablet 6   hydrochlorothiazide (HYDRODIURIL) 25 MG tablet Take 25 mg by mouth daily.     levothyroxine (SYNTHROID) 75 MCG tablet Take 75 mcg by mouth daily before breakfast.     Liniments (PAIN RELIEF EX) Apply 1 application topically 3 (three) times daily as needed (back pain.). [place] Gel - White Active  Ingredients  Camphor 3.00% Menthol 5.00%     metoprolol succinate (TOPROL-XL) 50 MG 24 hr tablet TAKE 1 TABLET BY MOUTH AT BEDTIME 30 tablet 6   Multiple Vitamins-Minerals (EMERGEN-C VITAMIN C PO) Take 1 packet by mouth daily in the afternoon.     rosuvastatin (CRESTOR) 5 MG tablet Take 5 mg by mouth daily.     sacubitril-valsartan (ENTRESTO) 24-26 MG Take 1 tablet by mouth 2 (two) times daily. 180 tablet 3   VITAMIN D PO Vitamin D3- Take one tablet by mouth daily     No current facility-administered medications for this encounter.    No Known Allergies    Social History   Socioeconomic History   Marital status: Married    Spouse name: Not on file   Number of children: Not on file   Years of education: Not on file   Highest education level: Not on file  Occupational History   Not on file  Tobacco Use   Smoking status: Never   Smokeless tobacco: Never  Substance and Sexual Activity   Alcohol use: Yes    Comment: occasionally has wine   Drug use: Never   Sexual activity: Not on file  Other Topics Concern   Not on file  Social History Narrative   Not on file   Social Determinants of Health   Financial Resource Strain: Not on file  Food Insecurity: Not on file  Transportation Needs: Not on file  Physical Activity: Not on file  Stress: Not on file  Social Connections: Not on file  Intimate Partner Violence: Not on file      Family History  Problem Relation Age of Onset   Ovarian cancer Mother    Coronary artery disease Father    AAA (abdominal aortic aneurysm) Father 84       cause of death   Alzheimer's disease Sister    Alzheimer's disease Brother    Heart defect Son        VSD, casued death at 31 m old   Alzheimer's disease Brother    Alzheimer's disease Brother     Vitals:   02/16/21 1108  BP: 106/62  Pulse: 87  SpO2: 96%  Weight: 98.9 kg (218 lb)  Height: 5\' 6"  (1.676 m)    PHYSICAL EXAM: General:  Well appearing. No resp difficulty HEENT:  normal Neck: supple. no JVD. Carotids 2+ bilat; no bruits. No lymphadenopathy or thryomegaly appreciated. Cor: PMI nondisplaced. Irregular rate & rhythm. No rubs, gallops or murmurs. Lungs: clear Abdomen: obese soft, nontender, nondistended. No hepatosplenomegaly. No bruits or masses. Good bowel sounds. Extremities: no cyanosis, clubbing, rash, tr edema L >R Neuro: alert & orientedx3, cranial nerves grossly intact. moves all 4 extremities w/o difficulty. Affect  pleasant  ECG:  ASSESSMENT & PLAN:  1. Chronic systolic HF due to NICM - Echo 6/20 [facility] EF 40% with anterior and AS HK, mild MR/TR. + marked biatrial enlargement.  - Echo 7/20 EF 30% with anterior and AS HK, mild MR/TR + marked biatrial enlargement. - Cath 7/20 No CAD. EF 30-35% - Echo 10/20  EF 30-35% - cMRI 12/20. LVEF 33%. No LGE - Echo 5/21 EF 45% (I felt 35-40%) - Echo 05/19/20 EF 45-50% RV ok. Personally reviewed - Doing well. NYHA II - mildly volume up on exam but has not taken lasix yet today - Continue Entresto 49/51 bid  - Continue Toprol 50 mg daily (was decreased due to fatigue) - Continue lasix to 40 mg M/W/F.  - Continue Jardiance 10 - Off spiro due to low BP  - BP too low to titrate GDMT - Etiology of CM remains unclear. Have suspected this may be due to AF with suboptimal rate control and negative inotropy from bb. Rate control has improved even with reduced dose Toprol last EF 45%. - Has seen Dr. Caryl Comes. He was not eager about ICD but felt she might need AVN ablation to control AF.  - HR controlled on decreased toprol, last EF 45%, likely okay to continue rate control  2. Permanent AF - by echo report seems like she may have restrictive CM with severe biatrial enlargement. Suspect this may be cause of AF - Continue Apixaban. No bleeding - HR now well controlled. Continue current regimen - Has seen Dr. Caryl Comes. He was not eager about ICD but felt she might need AVN ablation to control AF but so far  has not.   3. HTN - BP stable   4. Fatigue/snoring - Sleep study complete. Mild OSA 8.5.  - Suggest weight loss   - No change  Katherine Roan, MD  12:02 PM  Patient seen and examined with the above-signed Advanced Practice Provider and/or Housestaff. I personally reviewed laboratory data, imaging studies and relevant notes. I independently examined the patient and formulated the important aspects of the plan. I have edited the note to reflect any of my changes or salient points. I have personally discussed the plan with the patient and/or family.  Overall doing very well. NYHA II. Volume status mildly elevated today but generally well controlled. AF rate is now well controlled Tolerating Eliquis without significant bleeding.   General:  Well appearing. No resp difficulty HEENT: normal Neck: supple. JVP 7. Carotids 2+ bilat; no bruits. No lymphadenopathy or thryomegaly appreciated. Cor: PMI nondisplaced. Irregular rate & rhythm. No rubs, gallops or murmurs. Lungs: clear Abdomen: soft, nontender, nondistended. No hepatosplenomegaly. No bruits or masses. Good bowel sounds. Extremities: no cyanosis, clubbing, rash, trace edema Neuro: alert & orientedx3, cranial nerves grossly intact. moves all 4 extremities w/o difficulty. Affect pleasant  Doing well. NYHA II. Volume slightly elevated. Reinforced need for daily weights and reviewed use of sliding scale diuretics. AF rate controlled and well tolerated. Check echo at next visit to assure stability of EF.   Glori Bickers, MD  11:47 PM

## 2021-02-16 NOTE — Patient Instructions (Signed)
Please call our office in February 2023 to schedule your follow up appointment and echocardiogram  If you have any questions or concerns before your next appointment please send [place] a message through [place] or call our office at [phone].    TO LEAVE A MESSAGE FOR THE NURSE SELECT OPTION 2, PLEASE LEAVE A MESSAGE INCLUDING: YOUR NAME DATE OF BIRTH CALL BACK NUMBER REASON FOR CALL**this is important as we prioritize the call backs  YOU WILL RECEIVE A CALL BACK THE SAME DAY AS LONG AS YOU CALL BEFORE 4:00 PM  At the [facility], you and your health needs are our priority. As part of our continuing mission to provide you with exceptional heart care, we have created designated Provider Care Teams. These Care Teams include your primary Cardiologist (physician) and Advanced Practice Providers (APPs- Physician Assistants and Nurse Practitioners) who all work together to provide you with the care you need, when you need it.   You may see any of the following providers on your designated Care Team at your next follow up: Dr Glori Bickers Dr Loralie Champagne Dr Patrice Paradise, NP Lyda Jester, [place] Ginnie Smart Audry Riles, PharmD   Please be sure to bring in all your medications bottles to every appointment.

## 2021-02-25 ENCOUNTER — Other Ambulatory Visit (HOSPITAL_COMMUNITY): Payer: Self-pay | Admitting: Internal Medicine

## 2021-04-03 ENCOUNTER — Other Ambulatory Visit (HOSPITAL_COMMUNITY): Payer: Self-pay | Admitting: Internal Medicine

## 2021-04-03 DIAGNOSIS — I5022 Chronic systolic (congestive) heart failure: Secondary | ICD-10-CM | POA: Diagnosis not present

## 2021-04-03 DIAGNOSIS — I4891 Unspecified atrial fibrillation: Secondary | ICD-10-CM | POA: Diagnosis not present

## 2021-04-03 DIAGNOSIS — Z0001 Encounter for general adult medical examination with abnormal findings: Secondary | ICD-10-CM | POA: Diagnosis not present

## 2021-04-03 DIAGNOSIS — N183 Chronic kidney disease, stage 3 unspecified: Secondary | ICD-10-CM | POA: Diagnosis not present

## 2021-04-23 DIAGNOSIS — Z1231 Encounter for screening mammogram for malignant neoplasm of breast: Secondary | ICD-10-CM | POA: Diagnosis not present

## 2021-05-06 DIAGNOSIS — R921 Mammographic calcification found on diagnostic imaging of breast: Secondary | ICD-10-CM | POA: Diagnosis not present

## 2021-05-06 DIAGNOSIS — R922 Inconclusive mammogram: Secondary | ICD-10-CM | POA: Diagnosis not present

## 2021-05-06 DIAGNOSIS — R928 Other abnormal and inconclusive findings on diagnostic imaging of breast: Secondary | ICD-10-CM | POA: Diagnosis not present

## 2021-05-06 DIAGNOSIS — Z9889 Other specified postprocedural states: Secondary | ICD-10-CM | POA: Diagnosis not present

## 2021-07-27 DIAGNOSIS — Z13220 Encounter for screening for lipoid disorders: Secondary | ICD-10-CM | POA: Diagnosis not present

## 2021-07-27 DIAGNOSIS — K219 Gastro-esophageal reflux disease without esophagitis: Secondary | ICD-10-CM | POA: Diagnosis not present

## 2021-07-27 DIAGNOSIS — N183 Chronic kidney disease, stage 3 unspecified: Secondary | ICD-10-CM | POA: Diagnosis not present

## 2021-07-27 DIAGNOSIS — E039 Hypothyroidism, unspecified: Secondary | ICD-10-CM | POA: Diagnosis not present

## 2021-07-30 DIAGNOSIS — I480 Paroxysmal atrial fibrillation: Secondary | ICD-10-CM | POA: Diagnosis not present

## 2021-07-30 DIAGNOSIS — E039 Hypothyroidism, unspecified: Secondary | ICD-10-CM | POA: Diagnosis not present

## 2021-07-30 DIAGNOSIS — I10 Essential (primary) hypertension: Secondary | ICD-10-CM | POA: Diagnosis not present

## 2021-07-30 DIAGNOSIS — I5022 Chronic systolic (congestive) heart failure: Secondary | ICD-10-CM | POA: Diagnosis not present

## 2021-08-24 ENCOUNTER — Telehealth: Payer: Self-pay | Admitting: *Deleted

## 2021-08-24 NOTE — Telephone Encounter (Signed)
° °  Pre-operative Risk Assessment    Patient Name: Sara Leach  DOB: [birth year] MRN: [id number]     PT IS FOLLOWED BY DR. [place]; I WILL FORWARD THE CLEARANCE NOTES TO DR. Haroldine Laws.  Request for Surgical Clearance    Procedure:   LUMBAR ESI  Date of Surgery:  Clearance 09/01/21                                 Surgeon:  MD NOT LISTED  Surgeon's Group or Practice Name:  [facility] Phone number:  [phone] Fax number:  [phone] ATTN: Glenna Fellows EXT [phone]   Type of Clearance Requested:   - Medical  - Pharmacy:  Hold Apixaban (Eliquis) x 3 DAYS PRIOR   Type of Anesthesia:  Not Indicated   Additional requests/questions:    Jiles Prows   08/24/2021, 1:58 PM

## 2021-08-25 NOTE — Telephone Encounter (Signed)
I will forward to requesting office Dr. Clayborne Dana clearance and recommendations. If any questions please reach out to Dr. Haroldine Laws.

## 2021-09-01 DIAGNOSIS — M5416 Radiculopathy, lumbar region: Secondary | ICD-10-CM | POA: Diagnosis not present

## 2021-09-21 DIAGNOSIS — M5134 Other intervertebral disc degeneration, thoracic region: Secondary | ICD-10-CM | POA: Diagnosis not present

## 2021-09-21 DIAGNOSIS — M5136 Other intervertebral disc degeneration, lumbar region: Secondary | ICD-10-CM | POA: Diagnosis not present

## 2021-10-26 ENCOUNTER — Other Ambulatory Visit (HOSPITAL_COMMUNITY): Payer: Self-pay | Admitting: Internal Medicine

## 2021-12-08 ENCOUNTER — Encounter (HOSPITAL_COMMUNITY): Payer: Self-pay | Admitting: Internal Medicine

## 2021-12-08 ENCOUNTER — Ambulatory Visit (HOSPITAL_COMMUNITY)
Admission: RE | Admit: 2021-12-08 | Discharge: 2021-12-08 | Disposition: A | Discharge status: Home / Self Care | Payer: Medicare Other | Source: Ambulatory Visit | Attending: Internal Medicine | Admitting: Internal Medicine

## 2021-12-08 ENCOUNTER — Ambulatory Visit (HOSPITAL_BASED_OUTPATIENT_CLINIC_OR_DEPARTMENT_OTHER)
Admission: RE | Admit: 2021-12-08 | Discharge: 2021-12-08 | Disposition: A | Discharge status: Home / Self Care | Payer: Medicare Other | Source: Ambulatory Visit | Attending: Internal Medicine | Admitting: Internal Medicine

## 2021-12-08 VITALS — BP 118/60 | HR 82 | Wt 216.0 lb

## 2021-12-08 DIAGNOSIS — I10 Essential (primary) hypertension: Secondary | ICD-10-CM

## 2021-12-08 DIAGNOSIS — K219 Gastro-esophageal reflux disease without esophagitis: Secondary | ICD-10-CM | POA: Diagnosis not present

## 2021-12-08 DIAGNOSIS — G4733 Obstructive sleep apnea (adult) (pediatric): Secondary | ICD-10-CM | POA: Insufficient documentation

## 2021-12-08 DIAGNOSIS — Z7984 Long term (current) use of oral hypoglycemic drugs: Secondary | ICD-10-CM | POA: Insufficient documentation

## 2021-12-08 DIAGNOSIS — I358 Other nonrheumatic aortic valve disorders: Secondary | ICD-10-CM | POA: Diagnosis not present

## 2021-12-08 DIAGNOSIS — Z7901 Long term (current) use of anticoagulants: Secondary | ICD-10-CM | POA: Insufficient documentation

## 2021-12-08 DIAGNOSIS — Z853 Personal history of malignant neoplasm of breast: Secondary | ICD-10-CM | POA: Diagnosis not present

## 2021-12-08 DIAGNOSIS — I4819 Other persistent atrial fibrillation: Secondary | ICD-10-CM

## 2021-12-08 DIAGNOSIS — I5022 Chronic systolic (congestive) heart failure: Secondary | ICD-10-CM | POA: Diagnosis not present

## 2021-12-08 DIAGNOSIS — Z923 Personal history of irradiation: Secondary | ICD-10-CM | POA: Diagnosis not present

## 2021-12-08 DIAGNOSIS — I110 Hypertensive heart disease with heart failure: Secondary | ICD-10-CM | POA: Diagnosis not present

## 2021-12-08 DIAGNOSIS — E669 Obesity, unspecified: Secondary | ICD-10-CM | POA: Diagnosis not present

## 2021-12-08 DIAGNOSIS — E785 Hyperlipidemia, unspecified: Secondary | ICD-10-CM | POA: Diagnosis not present

## 2021-12-08 DIAGNOSIS — I4821 Permanent atrial fibrillation: Secondary | ICD-10-CM | POA: Insufficient documentation

## 2021-12-08 DIAGNOSIS — Z79899 Other long term (current) drug therapy: Secondary | ICD-10-CM | POA: Insufficient documentation

## 2021-12-08 LAB — ECHOCARDIOGRAM COMPLETE
Area-P 1/2: 3.1 cm2
Calc EF: 57.3 %
S' Lateral: 2.9 cm
Single Plane A2C EF: 56.4 %
Single Plane A4C EF: 56.5 %

## 2021-12-08 NOTE — Progress Notes (Signed)
?  Echocardiogram ?2D Echocardiogram has been performed. ? Sara Leach ?12/08/2021, 11:30 AM ?

## 2021-12-08 NOTE — Patient Instructions (Signed)
Medication Changes: ? ?None. Continue your current regimen.  ? ?Lab Work: ? ?None today. ? ?Special Instructions // Education: ? ?Do the following things EVERYDAY: ?Weigh yourself in the morning before breakfast. Write it down and keep it in a log. ?Take your medicines as prescribed ?Eat low salt foods--Limit salt (sodium) to 2000 mg per day.  ?Stay as active as you can everyday ?Limit all fluids for the day to less than 2 liters ? ?Follow-Up in: 6 months (November 2023). **Please call our office September to schedule this appointment** ? ?At the [facility], you and your health needs are our priority. We have a designated team specialized in the treatment of Heart Failure. This Care Team includes your primary Heart Failure Specialized Cardiologist (physician), Advanced Practice Providers (APPs- Physician Assistants and Nurse Practitioners), and Pharmacist who all work together to provide you with the care you need, when you need it.  ? ?You may see any of the following providers on your designated Care Team at your next follow up: ? ?Dr Glori Bickers ?Dr Loralie Champagne ?Darrick Grinder, NP ?Lyda Jester, PA ?Jessica Milford,NP ?Marlyce Huge, PA ?Audry Riles, PharmD ? ? ?Please be sure to bring in all your medications bottles to every appointment.  ? ?Need to Contact us: ? ?If you have any questions or concerns before your next appointment please send [place] a message through [place] or call our office at [phone].   ? ?TO LEAVE A MESSAGE FOR THE NURSE SELECT OPTION 2, PLEASE LEAVE A MESSAGE INCLUDING: ?YOUR NAME ?DATE OF BIRTH ?CALL BACK NUMBER ?REASON FOR CALL**this is important as we prioritize the call backs ? ?YOU WILL RECEIVE A CALL BACK THE SAME DAY AS LONG AS YOU CALL BEFORE 4:00 PM ? ? ?

## 2021-12-08 NOTE — Progress Notes (Signed)
? ?ADVANCED HF CLINIC  NOTE ? ?Referring Physician: Brudine ?Primary Care: Dr. Dalia Heading ?Primary Cardiologist: Glori Bickers ? ?HPI: ? ?Ms. Hutto is a 81 y/o woman with h/o obesity, breast CA (2009) - s/p XRT and partial mastectomy (no chemo), HTN , HL who is referred by Dr. Robin Searing for further evaluation of recent onset AF and systolic HF.  ? ?First found to be in AF during regular physical exam in 4/20. In June got echo at [facility] showed EF 40% with anterior and AS HK, mild MR/TR. + marked biatrial enlargement. Started on lisinopril and Toprol and Eliquis. Referred for further eval.   ? ?We saw her for the first time in 7/20 and repeat echo .with EF with anterior and anteroseptal WMA. Underwent cath as below with normal coronary arteries. EF 30-35%. Normal RHC ? ?Saw Dr. Caryl Comes. Decided against ICD.  ? ?F/u echo 5/21 EF 45% (I felt 35-40%) ? ?Echo today 12/08/21 EF 55-60%  ? ?Here for f/u. Babysitting great grandkids. Still baking some. No SOB, orthopnea or PND. No edema.  ? ?Echo  05/19/20 EF 45% RV ok. Personally reviewed ? ? ?Cardiac studies: ? ?Zio in 1/21 ?1. Continuous atrial fibrillation (100% burden) - ventricular rates ranging from 54- ?165 bpm (avg of 97 bpm).  ?2. Rare PVCs (< 1.0%)  ? ?Zio placed to assess adequacy of rate control with chronic AF (10/20) ?1. Continuous atrial fibrillation rates ranging from 58-181 bpm (avg of 100 bpm).  ?2. Rare PVCs (< 1.0%) ? ?Sleep study  ?AHI 8.5/hr  ? ?cMRI 07/20/19 ?1. Normal left ventricular size, thickness and severely decreased ?systolic function (LVEF = 33%). There is diffuse hypokinesis and no ?late gadolinium enhancement in the left ventricular myocardium. ECV ?is elevated at 32%. (T1 pre contrast: 1012 ms, T1 post contrast 1762 ?Ms). ?2. RV is normal  ? ?Echo 10/20 30-35% ? ?Cath 03/07/19 ? ?Ao = 135/79 (103) ?LV =  140/7 ?RA =  3  ?RV =  26/6 ?PA =  27/9 (19) ?PCW = 9 ?Fick cardiac output/index = 5.0/2.4 ?PVR = 2.0 WU ?SVR = 1587 ?Ao sat =  100% ?PA sat = 68%, 71% ?  ?Assessment: ?1. Normal coronaries ?2. NICM EF 30-35% ?3. Well-compensated hemodynamics. No evidence of PAH ? ? ?Past Medical History:  ?Diagnosis Date  ? Atrial fibrillation ([place]) 01/2019  ? Breast cancer ([place])   ? Cataracts, bilateral   ? GERD (gastroesophageal reflux disease)   ? Hyperlipidemia   ? Hypertension   ? Hyperthyroidism   ? Obesity   ? Osteoarthritis   ? Systolic heart failure ([place]) 01/2019  ? EF 40% by echo done 01/17/2019  ? ? ?Current Outpatient Medications  ?Medication Sig Dispense Refill  ? acetaminophen (TYLENOL) 500 MG tablet Take 500 mg by mouth 2 (two) times a day.    ? apixaban (ELIQUIS) 5 MG TABS tablet Take 5 mg by mouth 2 (two) times daily.     ? cephALEXin (KEFLEX) 500 MG capsule TAKE FOUR CAPSULES BY MOUTH ONE HOUR PRIOR TO ALL DENTAL APPOINTMENTS    ? dapagliflozin propanediol (FARXIGA) 10 MG TABS tablet Farxiga 10 mg tablet    ? Famotidine (PEPCID AC MAXIMUM STRENGTH) 20 MG CHEW Chew 1 tablet by mouth at bedtime.    ? furosemide (LASIX) 40 MG tablet TAKE (1) TABLET BY MOUTH EVERY MONDAY, WEDNESDAY AND FRIDAY. 12 tablet 11  ? hydrochlorothiazide (HYDRODIURIL) 25 MG tablet Take 25 mg by mouth daily.    ? JARDIANCE 10 MG  TABS tablet TAKE 1 TABLET BY MOUTH DAILY BEFORE BREAKFAST. 30 tablet 11  ? levothyroxine (SYNTHROID) 75 MCG tablet Take 75 mcg by mouth daily before breakfast.    ? Liniments (PAIN RELIEF EX) Apply 1 application topically 3 (three) times daily as needed (back pain.). [place] Gel - White ?Active Ingredients  ?Camphor 3.00% ?Menthol 5.00%    ? metoprolol succinate (TOPROL-XL) 50 MG 24 hr tablet TAKE 1 TABLET BY MOUTH AT BEDTIME 30 tablet 11  ? Multiple Vitamins-Minerals (EMERGEN-C VITAMIN C PO) Take 1 packet by mouth daily in the afternoon.    ? rosuvastatin (CRESTOR) 5 MG tablet Take 5 mg by mouth daily.    ? sacubitril-valsartan (ENTRESTO) 24-26 MG Take 1 tablet by mouth 2 (two) times daily. 180 tablet 3  ? VITAMIN D PO Vitamin D3- Take one tablet  by mouth daily    ? ?No current facility-administered medications for this encounter.  ? ? ?No Known Allergies ? ?  ?Social History  ? ?Socioeconomic History  ? Marital status: Married  ?  Spouse name: Not on file  ? Number of children: Not on file  ? Years of education: Not on file  ? Highest education level: Not on file  ?Occupational History  ? Not on file  ?Tobacco Use  ? Smoking status: Never  ? Smokeless tobacco: Never  ?Substance and Sexual Activity  ? Alcohol use: Yes  ?  Comment: occasionally has wine  ? Drug use: Never  ? Sexual activity: Not on file  ?Other Topics Concern  ? Not on file  ?Social History Narrative  ? Not on file  ? ?Social Determinants of Health  ? ?Financial Resource Strain: Not on file  ?Food Insecurity: Not on file  ?Transportation Needs: Not on file  ?Physical Activity: Not on file  ?Stress: Not on file  ?Social Connections: Not on file  ?Intimate Partner Violence: Not on file  ? ? ?  ?Family History  ?Problem Relation Age of Onset  ? Ovarian cancer Mother   ? Coronary artery disease Father   ? AAA (abdominal aortic aneurysm) Father 25  ?     cause of death  ? Alzheimer's disease Sister   ? Alzheimer's disease Brother   ? Heart defect Son   ?     VSD, casued death at 36 m old  ? Alzheimer's disease Brother   ? Alzheimer's disease Brother   ? ? ?Vitals:  ? 12/08/21 1132  ?BP: 118/60  ?Pulse: 82  ?SpO2: 96%  ?Weight: 98 kg (216 lb)  ? ? ?PHYSICAL EXAM: ?General:  Well appearing. No resp difficulty ?HEENT: normal ?Neck: supple. no JVD. Carotids 2+ bilat; no bruits. No lymphadenopathy or thryomegaly appreciated. ?Cor: PMI nondisplaced. Irregular rate & rhythm. No rubs, gallops or murmurs. ?Lungs: clear ?Abdomen: obese soft, nontender, nondistended. No hepatosplenomegaly. No bruits or masses. Good bowel sounds. ?Extremities: no cyanosis, clubbing, rash, edema ?Neuro: alert & orientedx3, cranial nerves grossly intact. moves all 4 extremities w/o difficulty. Affect pleasant ? ?ECG: AF 76 LBBB  Personally reviewed ? ? ?ASSESSMENT & PLAN: ? ?1. Chronic systolic HF due to NICM ?- Echo 6/20 [facility] EF 40% with anterior and AS HK, mild MR/TR. + marked biatrial enlargement.  ?- Echo 7/20 EF 30% with anterior and AS HK, mild MR/TR + marked biatrial enlargement. ?- Cath 7/20 No CAD. EF 30-35% ?- Echo 10/20  EF 30-35% ?- cMRI 12/20. LVEF 33%. No LGE ?- Echo 5/21 EF 45% (I felt 35-40%) ?- Echo  05/19/20 EF 45-50% RV ok.  ?- Echo today 12/08/21 EF 55-60%  ?Arlina Robes great. NYHA I-II. EF normalized ?- Volume status looks good ?- Continue Entresto 49/51 bid  ?- Continue Toprol 50 mg daily (was decreased due to fatigue) ?- Continue lasix to 40 mg M/W/F.  ?- Continue Jardiance 10 ?- Off spiro due to low BP  ?- BP too low to titrate GDMT ? ?2. Permanent AF ?- by echo report seems like she may have restrictive CM with severe biatrial enlargement. Suspect this may be cause of AF ?- Continue Apixaban. No bleeding ?- Good rate control. -> EF has normalized ? ?3. HTN ?- Blood pressure well controlled. Continue current regimen. ? ?4. Fatigue/snoring ?- Sleep study complete. Mild OSA 8.5.  ?- Working on weight loss ?- No change ? ?Glori Bickers, MD  ?12:33 PM ? ? ? ? ? ?

## 2021-12-22 DIAGNOSIS — M5416 Radiculopathy, lumbar region: Secondary | ICD-10-CM | POA: Diagnosis not present

## 2021-12-30 ENCOUNTER — Other Ambulatory Visit (HOSPITAL_COMMUNITY): Payer: Self-pay | Admitting: Internal Medicine

## 2022-01-11 DIAGNOSIS — Z6836 Body mass index (BMI) 36.0-36.9, adult: Secondary | ICD-10-CM | POA: Diagnosis not present

## 2022-01-11 DIAGNOSIS — L03011 Cellulitis of right finger: Secondary | ICD-10-CM | POA: Diagnosis not present

## 2022-01-15 DIAGNOSIS — L03011 Cellulitis of right finger: Secondary | ICD-10-CM | POA: Diagnosis not present

## 2022-01-15 DIAGNOSIS — Z6836 Body mass index (BMI) 36.0-36.9, adult: Secondary | ICD-10-CM | POA: Diagnosis not present

## 2022-01-15 DIAGNOSIS — M19041 Primary osteoarthritis, right hand: Secondary | ICD-10-CM | POA: Diagnosis not present

## 2022-01-26 DIAGNOSIS — E039 Hypothyroidism, unspecified: Secondary | ICD-10-CM | POA: Diagnosis not present

## 2022-01-26 DIAGNOSIS — E876 Hypokalemia: Secondary | ICD-10-CM | POA: Diagnosis not present

## 2022-01-26 DIAGNOSIS — E559 Vitamin D deficiency, unspecified: Secondary | ICD-10-CM | POA: Diagnosis not present

## 2022-01-26 DIAGNOSIS — R5383 Other fatigue: Secondary | ICD-10-CM | POA: Diagnosis not present

## 2022-01-28 DIAGNOSIS — I10 Essential (primary) hypertension: Secondary | ICD-10-CM | POA: Diagnosis not present

## 2022-01-28 DIAGNOSIS — E039 Hypothyroidism, unspecified: Secondary | ICD-10-CM | POA: Diagnosis not present

## 2022-01-28 DIAGNOSIS — I5022 Chronic systolic (congestive) heart failure: Secondary | ICD-10-CM | POA: Diagnosis not present

## 2022-01-28 DIAGNOSIS — Z0001 Encounter for general adult medical examination with abnormal findings: Secondary | ICD-10-CM | POA: Diagnosis not present

## 2022-02-18 DIAGNOSIS — M810 Age-related osteoporosis without current pathological fracture: Secondary | ICD-10-CM | POA: Diagnosis not present

## 2022-02-18 DIAGNOSIS — Z780 Asymptomatic menopausal state: Secondary | ICD-10-CM | POA: Diagnosis not present

## 2022-02-23 ENCOUNTER — Other Ambulatory Visit (HOSPITAL_COMMUNITY): Payer: Self-pay | Admitting: Internal Medicine

## 2022-03-29 DIAGNOSIS — M5416 Radiculopathy, lumbar region: Secondary | ICD-10-CM | POA: Diagnosis not present

## 2022-03-29 DIAGNOSIS — M5136 Other intervertebral disc degeneration, lumbar region: Secondary | ICD-10-CM | POA: Diagnosis not present

## 2022-04-15 DIAGNOSIS — E871 Hypo-osmolality and hyponatremia: Secondary | ICD-10-CM | POA: Diagnosis not present

## 2022-04-15 DIAGNOSIS — N183 Chronic kidney disease, stage 3 unspecified: Secondary | ICD-10-CM | POA: Diagnosis not present

## 2022-04-15 DIAGNOSIS — E039 Hypothyroidism, unspecified: Secondary | ICD-10-CM | POA: Diagnosis not present

## 2022-04-22 DIAGNOSIS — I5022 Chronic systolic (congestive) heart failure: Secondary | ICD-10-CM | POA: Diagnosis not present

## 2022-04-22 DIAGNOSIS — E039 Hypothyroidism, unspecified: Secondary | ICD-10-CM | POA: Diagnosis not present

## 2022-04-22 DIAGNOSIS — I10 Essential (primary) hypertension: Secondary | ICD-10-CM | POA: Diagnosis not present

## 2022-04-22 DIAGNOSIS — I480 Paroxysmal atrial fibrillation: Secondary | ICD-10-CM | POA: Diagnosis not present

## 2022-05-07 DIAGNOSIS — Z1231 Encounter for screening mammogram for malignant neoplasm of breast: Secondary | ICD-10-CM | POA: Diagnosis not present

## 2022-05-12 DIAGNOSIS — R928 Other abnormal and inconclusive findings on diagnostic imaging of breast: Secondary | ICD-10-CM | POA: Diagnosis not present

## 2022-05-12 DIAGNOSIS — R922 Inconclusive mammogram: Secondary | ICD-10-CM | POA: Diagnosis not present

## 2022-05-20 DIAGNOSIS — H0102A Squamous blepharitis right eye, upper and lower eyelids: Secondary | ICD-10-CM | POA: Diagnosis not present

## 2022-05-20 DIAGNOSIS — H0102B Squamous blepharitis left eye, upper and lower eyelids: Secondary | ICD-10-CM | POA: Diagnosis not present

## 2022-05-20 DIAGNOSIS — H43823 Vitreomacular adhesion, bilateral: Secondary | ICD-10-CM | POA: Diagnosis not present

## 2022-05-20 DIAGNOSIS — H18592 Other hereditary corneal dystrophies, left eye: Secondary | ICD-10-CM | POA: Diagnosis not present

## 2022-07-14 ENCOUNTER — Encounter (HOSPITAL_COMMUNITY): Payer: Self-pay | Admitting: Internal Medicine

## 2022-07-14 ENCOUNTER — Ambulatory Visit (HOSPITAL_COMMUNITY)
Admission: RE | Admit: 2022-07-14 | Discharge: 2022-07-14 | Disposition: A | Discharge status: Home / Self Care | Payer: Medicare Other | Source: Ambulatory Visit | Attending: Internal Medicine | Admitting: Internal Medicine

## 2022-07-14 VITALS — BP 94/50 | HR 79 | Wt 216.4 lb

## 2022-07-14 DIAGNOSIS — I110 Hypertensive heart disease with heart failure: Secondary | ICD-10-CM | POA: Insufficient documentation

## 2022-07-14 DIAGNOSIS — E669 Obesity, unspecified: Secondary | ICD-10-CM | POA: Insufficient documentation

## 2022-07-14 DIAGNOSIS — Z923 Personal history of irradiation: Secondary | ICD-10-CM | POA: Insufficient documentation

## 2022-07-14 DIAGNOSIS — I482 Chronic atrial fibrillation, unspecified: Secondary | ICD-10-CM

## 2022-07-14 DIAGNOSIS — Z79899 Other long term (current) drug therapy: Secondary | ICD-10-CM | POA: Insufficient documentation

## 2022-07-14 DIAGNOSIS — I5022 Chronic systolic (congestive) heart failure: Secondary | ICD-10-CM | POA: Insufficient documentation

## 2022-07-14 DIAGNOSIS — I428 Other cardiomyopathies: Secondary | ICD-10-CM | POA: Diagnosis not present

## 2022-07-14 DIAGNOSIS — I4821 Permanent atrial fibrillation: Secondary | ICD-10-CM | POA: Diagnosis not present

## 2022-07-14 DIAGNOSIS — R634 Abnormal weight loss: Secondary | ICD-10-CM | POA: Diagnosis not present

## 2022-07-14 DIAGNOSIS — M549 Dorsalgia, unspecified: Secondary | ICD-10-CM | POA: Diagnosis not present

## 2022-07-14 DIAGNOSIS — R5381 Other malaise: Secondary | ICD-10-CM | POA: Diagnosis not present

## 2022-07-14 DIAGNOSIS — G4733 Obstructive sleep apnea (adult) (pediatric): Secondary | ICD-10-CM | POA: Insufficient documentation

## 2022-07-14 DIAGNOSIS — Z7901 Long term (current) use of anticoagulants: Secondary | ICD-10-CM | POA: Insufficient documentation

## 2022-07-14 DIAGNOSIS — Z853 Personal history of malignant neoplasm of breast: Secondary | ICD-10-CM | POA: Diagnosis not present

## 2022-07-14 LAB — BASIC METABOLIC PANEL
Anion gap: 11 (ref 5–15)
BUN: 31 mg/dL — ABNORMAL HIGH (ref 8–23)
CO2: 27 mmol/L (ref 22–32)
Calcium: 9.4 mg/dL (ref 8.9–10.3)
Chloride: 96 mmol/L — ABNORMAL LOW (ref 98–111)
Creatinine, Ser: 1.22 mg/dL — ABNORMAL HIGH (ref 0.44–1.00)
GFR, Estimated: 45 mL/min — ABNORMAL LOW (ref >60)
Glucose, Bld: 108 mg/dL — ABNORMAL HIGH (ref 70–99)
Potassium: 3.3 mmol/L — ABNORMAL LOW (ref 3.5–5.1)
Sodium: 134 mmol/L — ABNORMAL LOW (ref 135–145)

## 2022-07-14 LAB — BRAIN NATRIURETIC PEPTIDE: B Natriuretic Peptide: 157.3 pg/mL — ABNORMAL HIGH (ref 0.0–100.0)

## 2022-07-14 MED ORDER — ENTRESTO 24-26 MG PO TABS: 1.0000 | ORAL_TABLET | Freq: Two times a day (BID) | ORAL | 11 refills | Status: DC

## 2022-07-14 NOTE — Patient Instructions (Addendum)
DECREASE Entresto to 24/26 mg one tab twice daily  Labs today We will only contact you if something comes back abnormal or we need to make some changes. Otherwise no news is good news!  You have been referred to [facility] at [place] -they will be in contact with you   Your physician wants you to follow-up in: 6 months with Dr Haroldine Laws and echo. You will receive a reminder letter in the mail two months in advance. If you don't receive a letter, please call our office to schedule the follow-up appointment.   Your physician has requested that you have an echocardiogram. Echocardiography is a painless test that uses sound waves to create images of your heart. It provides your doctor with information about the size and shape of your heart and how well your heart's chambers and valves are working. This procedure takes approximately one hour. There are no restrictions for this procedure. Please do NOT wear cologne, perfume, aftershave, or lotions (deodorant is allowed). Please arrive 15 minutes prior to your appointment time.  Do the following things EVERYDAY: Weigh yourself in the morning before breakfast. Write it down and keep it in a log. Take your medicines as prescribed Eat low salt foods--Limit salt (sodium) to 2000 mg per day.  Stay as active as you can everyday Limit all fluids for the day to less than 2 liters  At the [facility], you and your health needs are our priority. As part of our continuing mission to provide you with exceptional heart care, we have created designated Provider Care Teams. These Care Teams include your primary Cardiologist (physician) and Advanced Practice Providers (APPs- Physician Assistants and Nurse Practitioners) who all work together to provide you with the care you need, when you need it.   You may see any of the following providers on your designated Care Team at your next follow up: Dr  Glori Bickers Dr Loralie Champagne Dr. Roxana Hires, NP Lyda Jester, [place] [facility] [place], [place] Forestine Na, NP Audry Riles, PharmD   Please be sure to bring in all your medications bottles to every appointment.

## 2022-07-14 NOTE — Progress Notes (Signed)
ADVANCED HF CLINIC  NOTE  Referring Physician: Brudine Primary Care: Dr. Dalia Heading Primary Cardiologist: Glori Bickers  HPI:  Sara Leach is a 81 y/o woman with h/o obesity, breast CA (2009) - s/p XRT and partial mastectomy (no chemo), HTN , HL who is referred by Dr. Robin Searing for further evaluation of recent onset AF and systolic HF.   First found to be in AF during regular physical exam in 4/20. In June got echo at [facility] showed EF 40% with anterior and AS HK, mild MR/TR. + marked biatrial enlargement. Started on lisinopril and Toprol and Eliquis. Referred for further eval.    We saw her for the first time in 7/20 and repeat echo .with EF with anterior and anteroseptal WMA. Underwent cath as below with normal coronary arteries. EF 30-35%. Normal RHC  Saw Dr. Caryl Comes. Decided against ICD.   F/u echo 5/21 EF 45% (I felt 35-40%)   Echo  05/19/20 EF 45% RV ok.   Echo  12/08/21 EF 55-60%   Here for f/u. Feels good. Struggles with back pain. Has LE edema but well controlled with diuretics. No CP or SOB Complaint with meds.   Cardiac studies:  Zio in 1/21 1. Continuous atrial fibrillation (100% burden) - ventricular rates ranging from 54- 165 bpm (avg of 97 bpm).  2. Rare PVCs (< 1.0%)   Zio placed to assess adequacy of rate control with chronic AF (10/20) 1. Continuous atrial fibrillation rates ranging from 58-181 bpm (avg of 100 bpm).  2. Rare PVCs (< 1.0%)  Sleep study  AHI 8.5/hr   cMRI 07/20/19 1. Normal left ventricular size, thickness and severely decreased systolic function (LVEF = 33%). There is diffuse hypokinesis and no late gadolinium enhancement in the left ventricular myocardium. ECV is elevated at 32%. (T1 pre contrast: 1012 ms, T1 post contrast 1762 Ms). 2. RV is normal   Echo 10/20 30-35%  Cath 03/07/19  Ao = 135/79 (103) LV =  140/7 RA =  3  RV =  26/6 PA =  27/9 (19) PCW = 9 Fick cardiac output/index = 5.0/2.4 PVR = 2.0 WU SVR =  1587 Ao sat = 100% PA sat = 68%, 71%   Assessment: 1. Normal coronaries 2. NICM EF 30-35% 3. Well-compensated hemodynamics. No evidence of [place]   Past Medical History:  Diagnosis Date   Atrial fibrillation ([place]) 01/2019   Breast cancer ([place])    Cataracts, bilateral    GERD (gastroesophageal reflux disease)    Hyperlipidemia    Hypertension    Hyperthyroidism    Obesity    Osteoarthritis    Systolic heart failure ([place]) 01/2019   EF 40% by echo done 01/17/2019    Current Outpatient Medications  Medication Sig Dispense Refill   acetaminophen (TYLENOL) 500 MG tablet Take 500 mg by mouth 2 (two) times a day.     apixaban (ELIQUIS) 5 MG TABS tablet Take 5 mg by mouth 2 (two) times daily.      cephALEXin (KEFLEX) 500 MG capsule TAKE FOUR CAPSULES BY MOUTH ONE HOUR PRIOR TO ALL DENTAL APPOINTMENTS     dapagliflozin propanediol (FARXIGA) 10 MG TABS tablet Farxiga 10 mg tablet     ENTRESTO 24-26 MG TAKE 1 TABLET BY MOUTH TWICE DAILY. 60 tablet 11   Famotidine (PEPCID AC MAXIMUM STRENGTH) 20 MG CHEW Chew 1 tablet by mouth at bedtime.     furosemide (LASIX) 40 MG tablet TAKE (1) TABLET BY MOUTH EVERY MONDAY, WEDNESDAY AND FRIDAY. [place]  tablet 3   hydrochlorothiazide (HYDRODIURIL) 25 MG tablet Take 25 mg by mouth daily.     JARDIANCE 10 MG TABS tablet TAKE 1 TABLET BY MOUTH DAILY BEFORE BREAKFAST. 30 tablet 11   levothyroxine (SYNTHROID) 75 MCG tablet Take 75 mcg by mouth daily before breakfast.     Liniments (PAIN RELIEF EX) Apply 1 application topically 3 (three) times daily as needed (back pain.). [place] Gel - White Active Ingredients  Camphor 3.00% Menthol 5.00%     metoprolol succinate (TOPROL-XL) 50 MG 24 hr tablet TAKE 1 TABLET BY MOUTH AT BEDTIME 90 tablet 3   Multiple Vitamins-Minerals (EMERGEN-C VITAMIN C PO) Take 1 packet by mouth daily in the afternoon.     rosuvastatin (CRESTOR) 5 MG tablet Take 5 mg by mouth daily.     VITAMIN D PO Vitamin D3- Take one tablet by mouth daily      No current facility-administered medications for this encounter.    No Known Allergies    Social History   Socioeconomic History   Marital status: Married    Spouse name: Not on file   Number of children: Not on file   Years of education: Not on file   Highest education level: Not on file  Occupational History   Not on file  Tobacco Use   Smoking status: Never   Smokeless tobacco: Never  Substance and Sexual Activity   Alcohol use: Yes    Comment: occasionally has wine   Drug use: Never   Sexual activity: Not on file  Other Topics Concern   Not on file  Social History Narrative   Not on file   Social Determinants of Health   Financial Resource Strain: Not on file  Food Insecurity: Not on file  Transportation Needs: Not on file  Physical Activity: Not on file  Stress: Not on file  Social Connections: Not on file  Intimate Partner Violence: Not on file      Family History  Problem Relation Age of Onset   Ovarian cancer Mother    Coronary artery disease Father    AAA (abdominal aortic aneurysm) Father 62       cause of death   Alzheimer's disease Sister    Alzheimer's disease Brother    Heart defect Son        VSD, casued death at 55 m old   Alzheimer's disease Brother    Alzheimer's disease Brother     Vitals:   07/14/22 0934  BP: (!) 94/50  Pulse: 79  SpO2: 96%  Weight: 98.2 kg (216 lb 6.4 oz)   Wt Readings from Last 3 Encounters:  07/14/22 98.2 kg (216 lb 6.4 oz)  12/08/21 98 kg (216 lb)  02/16/21 98.9 kg (218 lb)    PHYSICAL EXAM: General:  Elderly obese HEENT: normal Neck: supple. no JVD. Carotids 2+ bilat; no bruits. No lymphadenopathy or thryomegaly appreciated. Cor: PMI nondisplaced. Irregular rate & rhythm. No rubs, gallops or murmurs. Lungs: clear Abdomen: soft, nontender, nondistended. No hepatosplenomegaly. No bruits or masses. Good bowel sounds. Extremities: no cyanosis, clubbing, rash, tr edema R>L Neuro: alert & orientedx3,  cranial nerves grossly intact. moves all 4 extremities w/o difficulty. Affect pleasant  ECG: AF 76 LBBB Personally reviewed   ASSESSMENT & PLAN:  1. Chronic systolic HF due to NICM - Echo 6/20 [facility] EF 40% with anterior and AS HK, mild MR/TR. + marked biatrial enlargement.  - Echo 7/20 EF 30% with anterior and AS HK, mild MR/TR +  marked biatrial enlargement. - Cath 7/20 No CAD. EF 30-35% - Echo 10/20  EF 30-35% - cMRI 12/20. LVEF 33%. No LGE - Echo 5/21 EF 45% (I felt 35-40%) - Echo 05/19/20 EF 45-50% RV ok.  - Echo 12/08/21 EF 55-60%  - Stable NYHA II. Not very active - Volume status looks good - BP low. Decrease Entresto 49/51 bid  -> 24/26 bid - Continue Toprol 50 mg daily (was decreased due to fatigue) - Continue lasix to 40 mg M/W/F.  - Continue Jardiance 10 - Off spiro due to low BP   2. Permanent AF - Appears to have restrictive CM with severe biatrial enlargement. Suspect this may be cause of AF - Continue Apixaban. No bleeding - Good rate control. -> EF has normalized  3. HTN - Blood pressure low. Cut back entresto   4. Fatigue/snoring - Sleep study complete. Mild OSA 8.5.  - Working on weight loss - No change  5. Physical deconditioning - Refer to PT/OT  Glori Bickers, MD  10:31 AM

## 2022-07-14 NOTE — Progress Notes (Signed)
Amb ref to PT/OT-at the request of the patient Order faxed to [facility] at [phone] phone # [phone]  If they are unable to accept patient will refer to [facility] at [facility]

## 2022-07-19 ENCOUNTER — Other Ambulatory Visit (HOSPITAL_COMMUNITY): Payer: Self-pay | Admitting: Cardiology

## 2022-07-19 ENCOUNTER — Telehealth (HOSPITAL_COMMUNITY): Payer: Self-pay | Admitting: Cardiology

## 2022-07-19 DIAGNOSIS — I5022 Chronic systolic (congestive) heart failure: Secondary | ICD-10-CM

## 2022-07-19 MED ORDER — POTASSIUM CHLORIDE CRYS ER 20 MEQ PO TBCR: EXTENDED_RELEASE_TABLET | ORAL | 3 refills | Status: DC

## 2022-07-19 NOTE — Telephone Encounter (Signed)
-----   Message from Jolaine Artist, MD sent at 07/16/2022  4:14 PM EST ----- Start kcl 20 daily. Take 2 tabs on the first day only.   Repeat bmet next week

## 2022-07-19 NOTE — Telephone Encounter (Signed)
Patient called.  Patient aware via grand daughter   Pt will have labs repeated at [facility] placed and released

## 2022-07-27 DIAGNOSIS — M6281 Muscle weakness (generalized): Secondary | ICD-10-CM | POA: Diagnosis not present

## 2022-07-27 DIAGNOSIS — R5381 Other malaise: Secondary | ICD-10-CM | POA: Diagnosis not present

## 2022-07-29 DIAGNOSIS — I5022 Chronic systolic (congestive) heart failure: Secondary | ICD-10-CM | POA: Diagnosis not present

## 2022-07-30 LAB — BASIC METABOLIC PANEL
BUN/Creatinine Ratio: 18 (ref 12–28)
BUN: 17 mg/dL (ref 8–27)
CO2: 23 mmol/L (ref 20–29)
Calcium: 9.9 mg/dL (ref 8.7–10.3)
Chloride: 91 mmol/L — ABNORMAL LOW (ref 96–106)
Creatinine, Ser: 0.94 mg/dL (ref 0.57–1.00)
Glucose: 95 mg/dL (ref 70–99)
Potassium: 4.1 mmol/L (ref 3.5–5.2)
Sodium: 130 mmol/L — ABNORMAL LOW (ref 134–144)
eGFR: 61 mL/min/{1.73_m2} (ref >59)

## 2022-08-04 DIAGNOSIS — R5381 Other malaise: Secondary | ICD-10-CM | POA: Diagnosis not present

## 2022-08-04 DIAGNOSIS — M6281 Muscle weakness (generalized): Secondary | ICD-10-CM | POA: Diagnosis not present

## 2022-08-11 DIAGNOSIS — M6281 Muscle weakness (generalized): Secondary | ICD-10-CM | POA: Diagnosis not present

## 2022-08-11 DIAGNOSIS — R5381 Other malaise: Secondary | ICD-10-CM | POA: Diagnosis not present

## 2022-08-18 DIAGNOSIS — M6281 Muscle weakness (generalized): Secondary | ICD-10-CM | POA: Diagnosis not present

## 2022-08-18 DIAGNOSIS — R5381 Other malaise: Secondary | ICD-10-CM | POA: Diagnosis not present

## 2022-08-24 DIAGNOSIS — M6281 Muscle weakness (generalized): Secondary | ICD-10-CM | POA: Diagnosis not present

## 2022-08-24 DIAGNOSIS — R5381 Other malaise: Secondary | ICD-10-CM | POA: Diagnosis not present

## 2022-08-31 DIAGNOSIS — R5381 Other malaise: Secondary | ICD-10-CM | POA: Diagnosis not present

## 2022-08-31 DIAGNOSIS — M6281 Muscle weakness (generalized): Secondary | ICD-10-CM | POA: Diagnosis not present

## 2022-09-08 DIAGNOSIS — M6281 Muscle weakness (generalized): Secondary | ICD-10-CM | POA: Diagnosis not present

## 2022-09-08 DIAGNOSIS — R5381 Other malaise: Secondary | ICD-10-CM | POA: Diagnosis not present

## 2022-09-13 DIAGNOSIS — M6281 Muscle weakness (generalized): Secondary | ICD-10-CM | POA: Diagnosis not present

## 2022-09-13 DIAGNOSIS — R5381 Other malaise: Secondary | ICD-10-CM | POA: Diagnosis not present

## 2022-09-16 DIAGNOSIS — M6281 Muscle weakness (generalized): Secondary | ICD-10-CM | POA: Diagnosis not present

## 2022-09-16 DIAGNOSIS — R5381 Other malaise: Secondary | ICD-10-CM | POA: Diagnosis not present

## 2022-09-20 DIAGNOSIS — M6281 Muscle weakness (generalized): Secondary | ICD-10-CM | POA: Diagnosis not present

## 2022-09-20 DIAGNOSIS — R5381 Other malaise: Secondary | ICD-10-CM | POA: Diagnosis not present

## 2022-09-23 DIAGNOSIS — R5381 Other malaise: Secondary | ICD-10-CM | POA: Diagnosis not present

## 2022-09-23 DIAGNOSIS — M6281 Muscle weakness (generalized): Secondary | ICD-10-CM | POA: Diagnosis not present

## 2022-09-27 DIAGNOSIS — R5381 Other malaise: Secondary | ICD-10-CM | POA: Diagnosis not present

## 2022-09-27 DIAGNOSIS — M6281 Muscle weakness (generalized): Secondary | ICD-10-CM | POA: Diagnosis not present

## 2022-09-30 DIAGNOSIS — M6281 Muscle weakness (generalized): Secondary | ICD-10-CM | POA: Diagnosis not present

## 2022-09-30 DIAGNOSIS — R5381 Other malaise: Secondary | ICD-10-CM | POA: Diagnosis not present

## 2022-10-04 DIAGNOSIS — M6281 Muscle weakness (generalized): Secondary | ICD-10-CM | POA: Diagnosis not present

## 2022-10-04 DIAGNOSIS — R5381 Other malaise: Secondary | ICD-10-CM | POA: Diagnosis not present

## 2022-10-07 DIAGNOSIS — M6281 Muscle weakness (generalized): Secondary | ICD-10-CM | POA: Diagnosis not present

## 2022-10-07 DIAGNOSIS — R5381 Other malaise: Secondary | ICD-10-CM | POA: Diagnosis not present

## 2022-10-11 DIAGNOSIS — R5381 Other malaise: Secondary | ICD-10-CM | POA: Diagnosis not present

## 2022-10-11 DIAGNOSIS — M6281 Muscle weakness (generalized): Secondary | ICD-10-CM | POA: Diagnosis not present

## 2022-10-14 DIAGNOSIS — E876 Hypokalemia: Secondary | ICD-10-CM | POA: Diagnosis not present

## 2022-10-14 DIAGNOSIS — I10 Essential (primary) hypertension: Secondary | ICD-10-CM | POA: Diagnosis not present

## 2022-10-14 DIAGNOSIS — M6281 Muscle weakness (generalized): Secondary | ICD-10-CM | POA: Diagnosis not present

## 2022-10-14 DIAGNOSIS — K219 Gastro-esophageal reflux disease without esophagitis: Secondary | ICD-10-CM | POA: Diagnosis not present

## 2022-10-14 DIAGNOSIS — Z13220 Encounter for screening for lipoid disorders: Secondary | ICD-10-CM | POA: Diagnosis not present

## 2022-10-14 DIAGNOSIS — R5381 Other malaise: Secondary | ICD-10-CM | POA: Diagnosis not present

## 2022-10-14 DIAGNOSIS — E039 Hypothyroidism, unspecified: Secondary | ICD-10-CM | POA: Diagnosis not present

## 2022-10-14 DIAGNOSIS — D529 Folate deficiency anemia, unspecified: Secondary | ICD-10-CM | POA: Diagnosis not present

## 2022-10-21 DIAGNOSIS — E039 Hypothyroidism, unspecified: Secondary | ICD-10-CM | POA: Diagnosis not present

## 2022-10-21 DIAGNOSIS — E871 Hypo-osmolality and hyponatremia: Secondary | ICD-10-CM | POA: Diagnosis not present

## 2022-10-21 DIAGNOSIS — I5022 Chronic systolic (congestive) heart failure: Secondary | ICD-10-CM | POA: Diagnosis not present

## 2022-10-21 DIAGNOSIS — I480 Paroxysmal atrial fibrillation: Secondary | ICD-10-CM | POA: Diagnosis not present

## 2022-10-26 ENCOUNTER — Other Ambulatory Visit (HOSPITAL_COMMUNITY): Payer: Self-pay | Admitting: Internal Medicine

## 2022-10-27 ENCOUNTER — Other Ambulatory Visit (HOSPITAL_COMMUNITY): Payer: Self-pay | Admitting: Internal Medicine

## 2022-11-10 DIAGNOSIS — E261 Secondary hyperaldosteronism: Secondary | ICD-10-CM | POA: Diagnosis not present

## 2022-11-10 DIAGNOSIS — I5022 Chronic systolic (congestive) heart failure: Secondary | ICD-10-CM | POA: Diagnosis not present

## 2022-11-10 DIAGNOSIS — E785 Hyperlipidemia, unspecified: Secondary | ICD-10-CM | POA: Diagnosis not present

## 2022-11-10 DIAGNOSIS — I482 Chronic atrial fibrillation, unspecified: Secondary | ICD-10-CM | POA: Diagnosis not present

## 2023-01-25 ENCOUNTER — Other Ambulatory Visit (HOSPITAL_COMMUNITY): Payer: Self-pay | Admitting: Internal Medicine

## 2023-02-15 ENCOUNTER — Ambulatory Visit (HOSPITAL_COMMUNITY)
Admission: RE | Admit: 2023-02-15 | Discharge: 2023-02-15 | Disposition: A | Discharge status: Home / Self Care | Payer: Medicare Other | Source: Ambulatory Visit | Attending: Family Medicine | Admitting: Family Medicine

## 2023-02-15 ENCOUNTER — Encounter (HOSPITAL_COMMUNITY): Payer: Self-pay | Admitting: Internal Medicine

## 2023-02-15 ENCOUNTER — Ambulatory Visit (HOSPITAL_BASED_OUTPATIENT_CLINIC_OR_DEPARTMENT_OTHER)
Admission: RE | Admit: 2023-02-15 | Discharge: 2023-02-15 | Disposition: A | Discharge status: Home / Self Care | Payer: Medicare Other | Source: Ambulatory Visit | Attending: Internal Medicine | Admitting: Internal Medicine

## 2023-02-15 VITALS — BP 132/72 | HR 88 | Wt 222.6 lb

## 2023-02-15 DIAGNOSIS — I5022 Chronic systolic (congestive) heart failure: Secondary | ICD-10-CM | POA: Diagnosis not present

## 2023-02-15 DIAGNOSIS — Z923 Personal history of irradiation: Secondary | ICD-10-CM | POA: Diagnosis not present

## 2023-02-15 DIAGNOSIS — Z853 Personal history of malignant neoplasm of breast: Secondary | ICD-10-CM | POA: Diagnosis not present

## 2023-02-15 DIAGNOSIS — I110 Hypertensive heart disease with heart failure: Secondary | ICD-10-CM | POA: Diagnosis not present

## 2023-02-15 DIAGNOSIS — I071 Rheumatic tricuspid insufficiency: Secondary | ICD-10-CM | POA: Insufficient documentation

## 2023-02-15 DIAGNOSIS — G4733 Obstructive sleep apnea (adult) (pediatric): Secondary | ICD-10-CM | POA: Diagnosis not present

## 2023-02-15 DIAGNOSIS — Z7901 Long term (current) use of anticoagulants: Secondary | ICD-10-CM | POA: Insufficient documentation

## 2023-02-15 DIAGNOSIS — I10 Essential (primary) hypertension: Secondary | ICD-10-CM

## 2023-02-15 DIAGNOSIS — Z79899 Other long term (current) drug therapy: Secondary | ICD-10-CM | POA: Insufficient documentation

## 2023-02-15 DIAGNOSIS — I482 Chronic atrial fibrillation, unspecified: Secondary | ICD-10-CM | POA: Diagnosis not present

## 2023-02-15 DIAGNOSIS — I428 Other cardiomyopathies: Secondary | ICD-10-CM | POA: Diagnosis not present

## 2023-02-15 DIAGNOSIS — I4821 Permanent atrial fibrillation: Secondary | ICD-10-CM | POA: Insufficient documentation

## 2023-02-15 DIAGNOSIS — I959 Hypotension, unspecified: Secondary | ICD-10-CM | POA: Diagnosis not present

## 2023-02-15 DIAGNOSIS — E785 Hyperlipidemia, unspecified: Secondary | ICD-10-CM | POA: Diagnosis not present

## 2023-02-15 DIAGNOSIS — R0683 Snoring: Secondary | ICD-10-CM

## 2023-02-15 LAB — ECHOCARDIOGRAM COMPLETE
Calc EF: 43.5 %
S' Lateral: 3.1 cm
Single Plane A2C EF: 48.3 %
Single Plane A4C EF: 42.2 %

## 2023-02-15 NOTE — Progress Notes (Signed)
Echocardiogram 2D Echocardiogram has been performed.  Sara Leach 02/15/2023, 9:00 AM

## 2023-02-15 NOTE — Addendum Note (Signed)
Encounter addended by: Kacelyn Hedges, RN on: 02/15/2023 10:17 AM  Actions taken: Clinical Note Signed

## 2023-02-15 NOTE — Patient Instructions (Signed)
There has been no changes to your medications.  Your physician recommends that you schedule a follow-up appointment in: 6 months ( January 2025) ** please call the office in November to arrange your follow up appointment. **  If you have any questions or concerns before your next appointment please send [place] a message through [place] or call our office at [phone].    TO LEAVE A MESSAGE FOR THE NURSE SELECT OPTION 2, PLEASE LEAVE A MESSAGE INCLUDING: YOUR NAME DATE OF BIRTH CALL BACK NUMBER REASON FOR CALL**this is important as we prioritize the call backs  YOU WILL RECEIVE A CALL BACK THE SAME DAY AS LONG AS YOU CALL BEFORE 4:00 PM  At the [facility], you and your health needs are our priority. As part of our continuing mission to provide you with exceptional heart care, we have created designated Provider Care Teams. These Care Teams include your primary Cardiologist (physician) and Advanced Practice Providers (APPs- Physician Assistants and Nurse Practitioners) who all work together to provide you with the care you need, when you need it.   You may see any of the following providers on your designated Care Team at your next follow up: Dr Arvilla Meres Dr Marca Ancona Dr. Marcos Eke, NP Robbie Lis, [place] [facility] [place], [place] Brynda Peon, NP Karle Plumber, PharmD   Please be sure to bring in all your medications bottles to every appointment.    Thank you for choosing [facility]

## 2023-02-15 NOTE — Progress Notes (Signed)
ADVANCED HF CLINIC  NOTE  Referring Physician: Brudine Primary Care: Dr. Margot Ables Primary Cardiologist: Arvilla Meres  HPI:  Ms. Zellmer is a 82 y/o woman with h/o obesity, breast CA (2009) - s/p XRT and partial mastectomy (no chemo), HTN , HL who is referred by Dr. Albertha Ghee for further evaluation of recent onset AF and systolic HF.   First found to be in AF during regular physical exam in 4/20. In June got echo at [facility] showed EF 40% with anterior and AS HK, mild MR/TR. + marked biatrial enlargement. Started on lisinopril and Toprol and Eliquis. Referred for further eval.    We saw her for the first time in 7/20 and repeat echo .with EF with anterior and anteroseptal WMA. Underwent cath as below with normal coronary arteries. EF 30-35%. Normal RHC  Saw Dr. Graciela Husbands. Decided against ICD.   F/u echo 5/21 EF 45% (I felt 35-40%)   Echo  05/19/20 EF 45% RV ok.   Echo  12/08/21 EF 55-60%   Echo today 02/14/23: EF 40-45%  Here today for f/u. Feels great. Does ADLs without problem. Chronic LE edema. Takes lasix 40 MWF. Doesn't take extra. No bleeding on Eliquis. No CP or undue SOB.   Cardiac studies:  Zio in 1/21 1. Continuous atrial fibrillation (100% burden) - ventricular rates ranging from 54- 165 bpm (avg of 97 bpm).  2. Rare PVCs (< 1.0%)   Zio placed to assess adequacy of rate control with chronic AF (10/20) 1. Continuous atrial fibrillation rates ranging from 58-181 bpm (avg of 100 bpm).  2. Rare PVCs (< 1.0%)  Sleep study  AHI 8.5/hr   cMRI 07/20/19 1. Normal left ventricular size, thickness and severely decreased systolic function (LVEF = 33%). There is diffuse hypokinesis and no late gadolinium enhancement in the left ventricular myocardium. ECV is elevated at 32%. (T1 pre contrast: 1012 ms, T1 post contrast 1762 Ms). 2. RV is normal   Echo 10/20 30-35%  Cath 03/07/19  Ao = 135/79 (103) LV =  140/7 RA =  3  RV =  26/6 PA =  27/9 (19) PCW =  9 Fick cardiac output/index = 5.0/2.4 PVR = 2.0 WU SVR = 1587 Ao sat = 100% PA sat = 68%, 71%   Assessment: 1. Normal coronaries 2. NICM EF 30-35% 3. Well-compensated hemodynamics. No evidence of PAH   Past Medical History:  Diagnosis Date   Atrial fibrillation (HCC) 01/2019   Breast cancer (HCC)    Cataracts, bilateral    GERD (gastroesophageal reflux disease)    Hyperlipidemia    Hypertension    Hyperthyroidism    Obesity    Osteoarthritis    Systolic heart failure (HCC) 01/2019   EF 40% by echo done 01/17/2019    Current Outpatient Medications  Medication Sig Dispense Refill   acetaminophen (TYLENOL) 500 MG tablet Take 500 mg by mouth 2 (two) times a day.     apixaban (ELIQUIS) 5 MG TABS tablet Take 5 mg by mouth 2 (two) times daily.      cephALEXin (KEFLEX) 500 MG capsule TAKE FOUR CAPSULES BY MOUTH ONE HOUR PRIOR TO ALL DENTAL APPOINTMENTS     dapagliflozin propanediol (FARXIGA) 10 MG TABS tablet Farxiga 10 mg tablet     empagliflozin (JARDIANCE) 10 MG TABS tablet TAKE 1 TABLET BY MOUTH DAILY BEFORE BREAKFAST. 30 tablet 11   Famotidine (PEPCID AC MAXIMUM STRENGTH) 20 MG CHEW Chew 1 tablet by mouth at bedtime.     furosemide (  LASIX) 40 MG tablet TAKE (1) TABLET BY MOUTH EVERY MONDAY, WEDNESDAY AND FRIDAY. 30 tablet 0   hydrochlorothiazide (HYDRODIURIL) 25 MG tablet Take 25 mg by mouth daily.     levothyroxine (SYNTHROID) 75 MCG tablet Take 75 mcg by mouth daily before breakfast.     Liniments (PAIN RELIEF EX) Apply 1 application topically 3 (three) times daily as needed (back pain.). [place] Gel - White Active Ingredients  Camphor 3.00% Menthol 5.00%     metoprolol succinate (TOPROL-XL) 50 MG 24 hr tablet TAKE 1 TABLET BY MOUTH AT BEDTIME 90 tablet 3   Multiple Vitamins-Minerals (EMERGEN-C VITAMIN C PO) Take 1 packet by mouth daily in the afternoon.     Potassium Chloride ER 20 MEQ TBCR Take 20 mEq by mouth daily.     rosuvastatin (CRESTOR) 5 MG tablet Take 5 mg by  mouth daily.     sacubitril-valsartan (ENTRESTO) 24-26 MG Take 1 tablet by mouth 2 (two) times daily. 60 tablet 11   VITAMIN D PO Vitamin D3- Take one tablet by mouth daily     No current facility-administered medications for this encounter.    No Known Allergies    Social History   Socioeconomic History   Marital status: Married    Spouse name: Not on file   Number of children: Not on file   Years of education: Not on file   Highest education level: Not on file  Occupational History   Not on file  Tobacco Use   Smoking status: Never   Smokeless tobacco: Never  Substance and Sexual Activity   Alcohol use: Yes    Comment: occasionally has wine   Drug use: Never   Sexual activity: Not on file  Other Topics Concern   Not on file  Social History Narrative   Not on file   Social Determinants of Health   Financial Resource Strain: Not on file  Food Insecurity: Not on file  Transportation Needs: Not on file  Physical Activity: Not on file  Stress: Not on file  Social Connections: Not on file  Intimate Partner Violence: Not on file      Family History  Problem Relation Age of Onset   Ovarian cancer Mother    Coronary artery disease Father    AAA (abdominal aortic aneurysm) Father 19       cause of death   Alzheimer's disease Sister    Alzheimer's disease Brother    Heart defect Son        VSD, casued death at 52 m old   Alzheimer's disease Brother    Alzheimer's disease Brother     Vitals:   02/15/23 0915  BP: 132/72  Pulse: 88  SpO2: 94%  Weight: 101 kg (222 lb 9.6 oz)   Wt Readings from Last 3 Encounters:  02/15/23 101 kg (222 lb 9.6 oz)  07/14/22 98.2 kg (216 lb 6.4 oz)  12/08/21 98 kg (216 lb)    PHYSICAL EXAM: General:  Elderly obese HEENT: normal Neck: supple. no JVD. Carotids 2+ bilat; no bruits. No lymphadenopathy or thryomegaly appreciated. Cor: PMI nondisplaced. Irregular rate & rhythm. No rubs, gallops or murmurs. Lungs: clear Abdomen:  obese soft, nontender, nondistended. No hepatosplenomegaly. No bruits or masses. Good bowel sounds. Extremities: no cyanosis, clubbing, rash, trace edema + compression socks Neuro: alert & orientedx3, cranial nerves grossly intact. moves all 4 extremities w/o difficulty. Affect pleasant  ECG: AF 83  LBBB ( ) Personally reviewed   ASSESSMENT & PLAN:  1.  Chronic systolic HF due to NICM - Echo 6/20 [facility] EF 40% with anterior and AS HK, mild MR/TR. + marked biatrial enlargement.  - Echo 7/20 EF 30% with anterior and AS HK, mild MR/TR + marked biatrial enlargement. - Cath 7/20 No CAD. EF 30-35% - Echo 10/20  EF 30-35% - cMRI 12/20. LVEF 33%. No LGE - Echo 5/21 EF 45% (I felt 35-40%) - Echo 05/19/20 EF 45-50% RV ok.  - Echo 12/08/21 EF 55-60%  - Echo today 02/14/23: EF 40-45%  - Stable NYHA I-II. Not very active - Volume status looks good - Continue Entresto 24/26 bid (failed 49/51 due to low BP) - Continue Toprol 50 mg daily (was decreased due to fatigue) - Continue lasix to 40 mg M/W/F.  - Continue Jardiance 10 - Off spiro due to low BP  - Recent bloodwork OK with PCP - Repeat echo in 1 year to ensure stability   2. Permanent AF - Appears to have restrictive CM with severe biatrial enlargement. Suspect this may be cause of AF - Continue Apixaban. No bleeding - Good rate control.  3. HTN - BP improved after reducing GDMT meds  4. Fatigue/snoring - Sleep study complete. Mild OSA 8.5.  - Working on weight loss -> Consider GLP1RA - No change  5. Physical deconditioning - Encouraged to be more active  Arvilla Meres, MD  9:26 AM

## 2023-02-22 ENCOUNTER — Other Ambulatory Visit (HOSPITAL_COMMUNITY): Payer: Self-pay | Admitting: Internal Medicine

## 2023-04-14 DIAGNOSIS — E039 Hypothyroidism, unspecified: Secondary | ICD-10-CM | POA: Diagnosis not present

## 2023-04-14 DIAGNOSIS — Z13220 Encounter for screening for lipoid disorders: Secondary | ICD-10-CM | POA: Diagnosis not present

## 2023-04-14 DIAGNOSIS — E559 Vitamin D deficiency, unspecified: Secondary | ICD-10-CM | POA: Diagnosis not present

## 2023-04-14 DIAGNOSIS — E871 Hypo-osmolality and hyponatremia: Secondary | ICD-10-CM | POA: Diagnosis not present

## 2023-04-14 DIAGNOSIS — N1831 Chronic kidney disease, stage 3a: Secondary | ICD-10-CM | POA: Diagnosis not present

## 2023-04-15 DIAGNOSIS — I482 Chronic atrial fibrillation, unspecified: Secondary | ICD-10-CM | POA: Diagnosis not present

## 2023-04-15 DIAGNOSIS — I5022 Chronic systolic (congestive) heart failure: Secondary | ICD-10-CM | POA: Diagnosis not present

## 2023-04-15 DIAGNOSIS — Z853 Personal history of malignant neoplasm of breast: Secondary | ICD-10-CM | POA: Diagnosis not present

## 2023-04-21 DIAGNOSIS — E039 Hypothyroidism, unspecified: Secondary | ICD-10-CM | POA: Diagnosis not present

## 2023-04-21 DIAGNOSIS — I480 Paroxysmal atrial fibrillation: Secondary | ICD-10-CM | POA: Diagnosis not present

## 2023-04-21 DIAGNOSIS — I5022 Chronic systolic (congestive) heart failure: Secondary | ICD-10-CM | POA: Diagnosis not present

## 2023-04-21 DIAGNOSIS — E871 Hypo-osmolality and hyponatremia: Secondary | ICD-10-CM | POA: Diagnosis not present

## 2023-04-26 DIAGNOSIS — Z0001 Encounter for general adult medical examination with abnormal findings: Secondary | ICD-10-CM | POA: Diagnosis not present

## 2023-05-16 DIAGNOSIS — Z1231 Encounter for screening mammogram for malignant neoplasm of breast: Secondary | ICD-10-CM | POA: Diagnosis not present

## 2023-06-13 ENCOUNTER — Other Ambulatory Visit (HOSPITAL_COMMUNITY): Payer: Self-pay | Admitting: Internal Medicine

## 2023-07-13 ENCOUNTER — Other Ambulatory Visit (HOSPITAL_COMMUNITY): Payer: Self-pay | Admitting: Internal Medicine

## 2023-07-15 DIAGNOSIS — E876 Hypokalemia: Secondary | ICD-10-CM | POA: Diagnosis not present

## 2023-07-15 DIAGNOSIS — D6869 Other thrombophilia: Secondary | ICD-10-CM | POA: Diagnosis not present

## 2023-07-22 ENCOUNTER — Telehealth: Payer: Self-pay | Admitting: *Deleted

## 2023-07-22 NOTE — Telephone Encounter (Signed)
Pharmacy please advise on holding Eliquis prior to Lumbar ESI  scheduled for 07/26/2023. Thank you.

## 2023-07-22 NOTE — Telephone Encounter (Signed)
   Pre-operative Risk Assessment    Patient Name: Sara Leach  DOB: 28-Feb-1941 MRN: [id number]   Last OV: Dr. Gala Romney; Pt is followed by CHF.  This pre op was also routed to Dr. Gala Romney.   Upcoming OV: None  Request for Surgical Clearance    Procedure:   Lumbar ESI  Date of Surgery:  Clearance 07/26/23                                 Surgeon:  Not indicated.  Surgeon's Group or Practice Name:  [facility] Phone number:  [phone] Fax number:  [phone]   Type of Clearance Requested:   - Medical  - Pharmacy:  Hold Apixaban (Eliquis) 3 days prior   Type of Anesthesia:  Not Indicated   Additional requests/questions:    Signed, Emmit Pomfret   07/22/2023, 11:03 AM

## 2023-07-22 NOTE — Telephone Encounter (Signed)
   Name: Sara Leach  DOB: 04-Apr-1941  MRN: [id number]  Primary Cardiologist: Armanda Magic, MD   Preoperative team, please contact this patient and set up a phone call appointment for further preoperative risk assessment. Please obtain consent and complete medication review. Thank you for your help.Needs appt ASAP as the procedure is scheduled for 07/26/2023 and Eliquis needs to held for 3 days prior to [facility]  I confirm that guidance regarding antiplatelet and oral anticoagulation therapy has been completed and, if necessary, noted below  Per office protocol, patient can hold Eliquis for 3 days prior to procedure.    I also confirmed the patient resides in the state of [place]. As per [facility] Medical Board telemedicine laws, the patient must reside in the state in which the provider is licensed.   Joni Reining, NP 07/22/2023, 11:34 AM [facility]

## 2023-07-22 NOTE — Telephone Encounter (Signed)
Tried calling patient no answer left a vm to call back to schedule telephone appointment.

## 2023-07-22 NOTE — Telephone Encounter (Signed)
Patient with diagnosis of afib on Eliquis for anticoagulation.    Procedure:  Lumbar ESI  Date of procedure: 07/26/23   CHA2DS2-VASc Score = 5   This indicates a 7.2% annual risk of stroke. The patient's score is based upon: CHF History: 1 HTN History: 1 Diabetes History: 0 Stroke History: 0 Vascular Disease History: 0 Age Score: 2 Gender Score: 1      CrCl 55 ml/min  Per office protocol, patient can hold Eliquis for 3 days prior to procedure.    **This guidance is not considered finalized until pre-operative APP has relayed final recommendations.**

## 2023-07-25 NOTE — Telephone Encounter (Signed)
   Patient Name: Sara Leach  DOB: [birth year] MRN: [id number]  Primary Cardiologist: Armanda Magic, MD  Chart reviewed as part of pre-operative protocol coverage. Given past medical history and time since last visit, based on ACC/AHA guidelines, [name] is at acceptable risk for the planned procedure without further cardiovascular testing. Per Dr. Gala Romney, "I spoke with patient's granddaughter and cleared her."  Per office protocol, she may hold Eliquis for 3 days prior to procedure. Please resume Eliquis as soon as possible postprocedure, at the discretion of the surgeon.   I will route this recommendation to the requesting party via Epic fax function and remove from pre-op pool.  Please call with questions.  Joylene Grapes, NP 07/25/2023, 9:37 AM

## 2023-07-25 NOTE — Telephone Encounter (Signed)
I will forward back to preop if any further notes are needed. See notes from Dr. Gala Romney.

## 2023-07-26 DIAGNOSIS — M5416 Radiculopathy, lumbar region: Secondary | ICD-10-CM | POA: Diagnosis not present

## 2023-07-28 DIAGNOSIS — I5022 Chronic systolic (congestive) heart failure: Secondary | ICD-10-CM | POA: Diagnosis not present

## 2023-09-01 DIAGNOSIS — H26493 Other secondary cataract, bilateral: Secondary | ICD-10-CM | POA: Diagnosis not present

## 2023-09-01 DIAGNOSIS — H43823 Vitreomacular adhesion, bilateral: Secondary | ICD-10-CM | POA: Diagnosis not present

## 2023-09-01 DIAGNOSIS — D492 Neoplasm of unspecified behavior of bone, soft tissue, and skin: Secondary | ICD-10-CM | POA: Diagnosis not present

## 2023-09-01 DIAGNOSIS — D3132 Benign neoplasm of left choroid: Secondary | ICD-10-CM | POA: Diagnosis not present

## 2023-10-10 ENCOUNTER — Other Ambulatory Visit: Payer: Self-pay | Admitting: Internal Medicine

## 2023-10-13 DIAGNOSIS — M5416 Radiculopathy, lumbar region: Secondary | ICD-10-CM | POA: Diagnosis not present

## 2023-10-19 ENCOUNTER — Telehealth: Payer: Self-pay | Admitting: Internal Medicine

## 2023-10-19 NOTE — Telephone Encounter (Signed)
 Lvm to confirm appt on 10/20/23

## 2023-10-20 ENCOUNTER — Encounter: Payer: Medicare Other | Admitting: Internal Medicine

## 2023-11-01 DIAGNOSIS — Z853 Personal history of malignant neoplasm of breast: Secondary | ICD-10-CM | POA: Diagnosis not present

## 2023-11-01 DIAGNOSIS — I482 Chronic atrial fibrillation, unspecified: Secondary | ICD-10-CM | POA: Diagnosis not present

## 2023-11-01 DIAGNOSIS — I5022 Chronic systolic (congestive) heart failure: Secondary | ICD-10-CM | POA: Diagnosis not present

## 2023-11-20 ENCOUNTER — Other Ambulatory Visit (HOSPITAL_COMMUNITY): Payer: Self-pay | Admitting: Internal Medicine

## 2023-12-01 DIAGNOSIS — Z7901 Long term (current) use of anticoagulants: Secondary | ICD-10-CM | POA: Diagnosis not present

## 2023-12-01 DIAGNOSIS — D6869 Other thrombophilia: Secondary | ICD-10-CM | POA: Diagnosis not present

## 2024-01-05 ENCOUNTER — Encounter (HOSPITAL_COMMUNITY): Payer: Self-pay | Admitting: Internal Medicine

## 2024-01-05 ENCOUNTER — Ambulatory Visit (HOSPITAL_COMMUNITY)
Admission: RE | Admit: 2024-01-05 | Discharge: 2024-01-05 | Disposition: A | Discharge status: Home / Self Care | Source: Ambulatory Visit | Attending: Internal Medicine | Admitting: Internal Medicine

## 2024-01-05 VITALS — BP 118/54 | HR 91 | Ht 67.0 in | Wt 228.8 lb

## 2024-01-05 DIAGNOSIS — I110 Hypertensive heart disease with heart failure: Secondary | ICD-10-CM | POA: Diagnosis not present

## 2024-01-05 DIAGNOSIS — R5381 Other malaise: Secondary | ICD-10-CM

## 2024-01-05 DIAGNOSIS — E669 Obesity, unspecified: Secondary | ICD-10-CM | POA: Diagnosis not present

## 2024-01-05 DIAGNOSIS — Z6835 Body mass index (BMI) 35.0-35.9, adult: Secondary | ICD-10-CM | POA: Diagnosis not present

## 2024-01-05 DIAGNOSIS — E66812 Obesity, class 2: Secondary | ICD-10-CM

## 2024-01-05 DIAGNOSIS — Z853 Personal history of malignant neoplasm of breast: Secondary | ICD-10-CM | POA: Insufficient documentation

## 2024-01-05 DIAGNOSIS — G4733 Obstructive sleep apnea (adult) (pediatric): Secondary | ICD-10-CM | POA: Diagnosis not present

## 2024-01-05 DIAGNOSIS — I10 Essential (primary) hypertension: Secondary | ICD-10-CM

## 2024-01-05 DIAGNOSIS — Z7984 Long term (current) use of oral hypoglycemic drugs: Secondary | ICD-10-CM | POA: Insufficient documentation

## 2024-01-05 DIAGNOSIS — Z79899 Other long term (current) drug therapy: Secondary | ICD-10-CM | POA: Insufficient documentation

## 2024-01-05 DIAGNOSIS — Z7901 Long term (current) use of anticoagulants: Secondary | ICD-10-CM | POA: Diagnosis not present

## 2024-01-05 DIAGNOSIS — I5022 Chronic systolic (congestive) heart failure: Secondary | ICD-10-CM | POA: Diagnosis not present

## 2024-01-05 DIAGNOSIS — I959 Hypotension, unspecified: Secondary | ICD-10-CM | POA: Insufficient documentation

## 2024-01-05 DIAGNOSIS — R5383 Other fatigue: Secondary | ICD-10-CM | POA: Insufficient documentation

## 2024-01-05 DIAGNOSIS — I4821 Permanent atrial fibrillation: Secondary | ICD-10-CM | POA: Insufficient documentation

## 2024-01-05 DIAGNOSIS — Z923 Personal history of irradiation: Secondary | ICD-10-CM | POA: Insufficient documentation

## 2024-01-05 DIAGNOSIS — I482 Chronic atrial fibrillation, unspecified: Secondary | ICD-10-CM

## 2024-01-05 LAB — BASIC METABOLIC PANEL WITH GFR
Anion gap: 11 (ref 5–15)
BUN: 23 mg/dL (ref 8–23)
CO2: 27 mmol/L (ref 22–32)
Calcium: 9.6 mg/dL (ref 8.9–10.3)
Chloride: 91 mmol/L — ABNORMAL LOW (ref 98–111)
Creatinine, Ser: 1.17 mg/dL — ABNORMAL HIGH (ref 0.44–1.00)
GFR, Estimated: 46 mL/min — ABNORMAL LOW (ref >60)
Glucose, Bld: 102 mg/dL — ABNORMAL HIGH (ref 70–99)
Potassium: 3.7 mmol/L (ref 3.5–5.1)
Sodium: 129 mmol/L — ABNORMAL LOW (ref 135–145)

## 2024-01-05 LAB — BRAIN NATRIURETIC PEPTIDE: B Natriuretic Peptide: 119.8 pg/mL — ABNORMAL HIGH (ref 0.0–100.0)

## 2024-01-05 NOTE — Patient Instructions (Signed)
 Labs done today, we will call you for abnormal results  You have been referred to [facility], they will call you  You have been referred to [facility] in [place], they will call you to schedule  Your physician recommends that you schedule a follow-up appointment in: 6 months with echocardiogram (November), **PLEASE CALL OUR OFFICE IN SEPTEMBER TO SCHEDULE THIS APPOINTMENT  If you have any questions or concerns before your next appointment please send us  a message through [facility] or call our office at [phone].    TO LEAVE A MESSAGE FOR THE NURSE SELECT OPTION 2, PLEASE LEAVE A MESSAGE INCLUDING: YOUR NAME DATE OF BIRTH CALL BACK NUMBER REASON FOR CALL**this is important as we prioritize the call backs  YOU WILL RECEIVE A CALL BACK THE SAME DAY AS LONG AS YOU CALL BEFORE 4:00 PM  At the [facility], you and your health needs are our priority. As part of our continuing mission to provide you with exceptional heart care, we have created designated Provider Care Teams. These Care Teams include your primary Cardiologist (physician) and Advanced Practice Providers (APPs- Physician Assistants and Nurse Practitioners) who all work together to provide you with the care you need, when you need it.   You may see any of the following providers on your designated Care Team at your next follow up: Dr Jules Oar Dr Peder Bourdon Dr. Alwin Baars Dr. Arta Lark Amy Marijane Shoulders, NP Ruddy Corral, [place] [facility] [place], [place] Dennise Fitz, NP [place] Lee, NP Shawnee Dellen, NP Luster Salters, PharmD Bevely Brush, PharmD   Please be sure to bring in all your medications bottles to every appointment.    Thank you for choosing [facility]

## 2024-01-05 NOTE — Progress Notes (Signed)
 ADVANCED HF CLINIC  NOTE  Referring Physician: Brudine Primary Care: Dr. Dara Ear Primary Cardiologist: Jules Oar  HPI:  [name] is a 83 y/o woman with h/o obesity, breast CA (2009) - s/p XRT and partial mastectomy (no chemo), HTN , HL who is referred by Dr. Brudine for further evaluation of recent onset AF and systolic HF.   First found to be in AF during regular physical exam in 4/20. In June got echo at [facility] showed EF 40% with anterior and AS HK, mild MR/TR. + marked biatrial enlargement. Started on lisinopril and Toprol  and Eliquis . Referred for further eval.    We saw her for the first time in 7/20 and repeat echo .with EF with anterior and anteroseptal WMA. Underwent cath as below with normal coronary arteries. EF 30-35%. Normal RHC  Saw Dr. Rodolfo Clan. Decided against ICD.   F/u echo 5/21 EF 45% (I felt 35-40%)   Echo  05/19/20 EF 45% RV ok.   Echo  12/08/21 EF 55-60%   Echo 02/14/23: EF 40-45%  Here today for f/u with her husband and granddaughter. Feels ok but family is concerned that she isn't moving at all. Very sedentary. Very poor stamina. Denies significant SOB,. Husband waits on her. No edema, CP, orthopnea or PND. Takes lasix  MWF with good response  Cardiac studies:  Zio in 1/21 1. Continuous atrial fibrillation (100% burden) - ventricular rates ranging from 54- 165 bpm (avg of 97 bpm).  2. Rare PVCs (< 1.0%)   Zio placed to assess adequacy of rate control with chronic AF (10/20) 1. Continuous atrial fibrillation rates ranging from 58-181 bpm (avg of 100 bpm).  2. Rare PVCs (< 1.0%)  Sleep study  AHI 8.5/hr   cMRI 07/20/19 1. Normal left ventricular size, thickness and severely decreased systolic function (LVEF = 33%). There is diffuse hypokinesis and no late gadolinium enhancement in the left ventricular myocardium. ECV is elevated at 32%. (T1 pre contrast: 1012 ms, T1 post contrast 1762 Ms). 2. RV is normal   Echo 10/20  30-35%  Cath 03/07/19  Ao = 135/79 (103) LV =  140/7 RA =  3  RV =  26/6 PA =  27/9 (19) PCW = 9 Fick cardiac output/index = 5.0/2.4 PVR = 2.0 WU SVR = 1587 Ao sat = 100% PA sat = 68%, 71%   Assessment: 1. Normal coronaries 2. NICM EF 30-35% 3. Well-compensated hemodynamics. No evidence of PAH   Past Medical History:  Diagnosis Date   Atrial fibrillation (HCC) 01/2019   Breast cancer (HCC)    Cataracts, bilateral    GERD (gastroesophageal reflux disease)    Hyperlipidemia    Hypertension    Hyperthyroidism    Obesity    Osteoarthritis    Systolic heart failure (HCC) 01/2019   EF 40% by echo done 01/17/2019    Current Outpatient Medications  Medication Sig Dispense Refill   acetaminophen  (TYLENOL ) 500 MG tablet Take 500 mg by mouth 2 (two) times a day.     apixaban  (ELIQUIS ) 5 MG TABS tablet Take 5 mg by mouth 2 (two) times daily.      cephALEXin (KEFLEX) 500 MG capsule TAKE FOUR CAPSULES BY MOUTH ONE HOUR PRIOR TO ALL DENTAL APPOINTMENTS     ENTRESTO  24-26 MG TAKE 1 TABLET BY MOUTH TWICE DAILY. 60 tablet 11   Famotidine  (PEPCID  AC MAXIMUM STRENGTH) 20 MG CHEW Chew 1 tablet by mouth at bedtime.     furosemide  (LASIX ) 40 MG tablet TAKE (  1) TABLET BY MOUTH EVERY MONDAY, WEDNESDAY AND FRIDAY. 30 tablet 3   hydrochlorothiazide (HYDRODIURIL) 25 MG tablet Take 25 mg by mouth daily.     JARDIANCE  10 MG TABS tablet TAKE 1 TABLET BY MOUTH DAILY BEFORE BREAKFAST. 30 tablet 11   levothyroxine (SYNTHROID) 75 MCG tablet Take 75 mcg by mouth daily before breakfast.     Liniments (PAIN RELIEF EX) Apply 1 application topically 3 (three) times daily as needed (back pain.). [place] Gel - White Active Ingredients  Camphor 3.00% Menthol 5.00%     metoprolol  succinate (TOPROL -XL) 50 MG 24 hr tablet TAKE 1 TABLET BY MOUTH AT BEDTIME 30 tablet 3   Multiple Vitamins-Minerals (EMERGEN-C VITAMIN C PO) Take 1 packet by mouth daily in the afternoon.     Potassium Chloride  ER 20 MEQ TBCR Take 20  mEq by mouth daily.     potassium chloride  SA (KLOR-CON  M) 20 MEQ tablet TAKE 2 TABLETS ON DAY 1; THEN 1 TABLET DAILY. 90 tablet 3   rosuvastatin (CRESTOR) 5 MG tablet Take 5 mg by mouth daily.     VITAMIN D PO Vitamin D3- Take one tablet by mouth daily     No current facility-administered medications for this encounter.    No Known Allergies    Social History   Socioeconomic History   Marital status: Married    Spouse name: Not on file   Number of children: Not on file   Years of education: Not on file   Highest education level: Not on file  Occupational History   Not on file  Tobacco Use   Smoking status: Never   Smokeless tobacco: Never  Substance and Sexual Activity   Alcohol use: Yes    Comment: occasionally has wine   Drug use: Never   Sexual activity: Not on file  Other Topics Concern   Not on file  Social History Narrative   Not on file   Social Drivers of Health   Financial Resource Strain: Not on file  Food Insecurity: Not on file  Transportation Needs: Not on file  Physical Activity: Not on file  Stress: Not on file  Social Connections: Not on file  Intimate Partner Violence: Not on file      Family History  Problem Relation Age of Onset   Ovarian cancer Mother    Coronary artery disease Father    AAA (abdominal aortic aneurysm) Father 8       cause of death   Alzheimer's disease Sister    Alzheimer's disease Brother    Heart defect Son        VSD, casued death at 68 m old   Alzheimer's disease Brother    Alzheimer's disease Brother     Vitals:   01/05/24 0912  BP: (!) 118/54  Pulse: 91  SpO2: 97%  Weight: 103.8 kg (228 lb 12.8 oz)  Height: 5\' 7"  (1.702 m)   Wt Readings from Last 3 Encounters:  01/05/24 103.8 kg (228 lb 12.8 oz)  02/15/23 101 kg (222 lb 9.6 oz)  07/14/22 98.2 kg (216 lb 6.4 oz)    PHYSICAL EXAM: General:  Well appearing. No resp difficulty HEENT: normal Neck: supple. no JVD. Carotids 2+ bilat; no bruits. No  lymphadenopathy or thryomegaly appreciated. Cor: PMI nondisplaced. Irregular rate & rhythm. No rubs, gallops or murmurs. Lungs: clear Abdomen: obese soft, nontender, nondistended. No hepatosplenomegaly. No bruits or masses. Good bowel sounds. Extremities: no cyanosis, clubbing, rash, edema Neuro: alert & orientedx3, cranial nerves  grossly intact. moves all 4 extremities w/o difficulty. Affect pleasant   ECG:  AF 63 IVCD ( ) Personally reviewed   ASSESSMENT & PLAN:  1. Chronic systolic HF due to NICM - Echo 6/20 [facility] EF 40% with anterior and AS HK, mild MR/TR. + marked biatrial enlargement.  - Echo 7/20 EF 30% with anterior and AS HK, mild MR/TR + marked biatrial enlargement. - Cath 7/20 No CAD. EF 30-35% - Echo 10/20  EF 30-35% - cMRI 12/20. LVEF 33%. No LGE - Echo 5/21 EF 45% (I felt 35-40%) - Echo 05/19/20 EF 45-50% RV ok.  - Echo 12/08/21 EF 55-60%  - Echo  02/14/23: EF 40-45%  - Stable NYHA II Main issue is deconditioning and obesity - Volume ok - Continue Entresto  24/26 bid (failed 49/51 due to low BP) - Continue Toprol  50 mg daily (was decreased due to fatigue) - Continue lasix  to 40 mg M/W/F.  - Continue Jardiance  10 - Off spiro due to low BP  - Refer to PharmD for GLP1RA  2. Permanent AF - Appears to have restrictive CM with severe biatrial enlargement. Suspect this may be cause of AF - Rate controlled - Continue Eliquis . No bleeding  3. HTN - BP ok. GDMT as above  4. Fatigue/snoring - Sleep study complete. Mild OSA 8.5.  - Working on weight loss - Refer to PharmD for [employer]  5. Physical deconditioning - Encouraged to be more active - Refer to PT/OT  6. Obesity - Body mass index is 35.84 kg/m. - As above refer to PharmD for [facility]   Jules Oar, MD  10:21 AM

## 2024-02-07 ENCOUNTER — Other Ambulatory Visit: Payer: Self-pay | Admitting: Internal Medicine

## 2024-02-13 DIAGNOSIS — M545 Low back pain, unspecified: Secondary | ICD-10-CM | POA: Diagnosis not present

## 2024-02-13 DIAGNOSIS — M6281 Muscle weakness (generalized): Secondary | ICD-10-CM | POA: Diagnosis not present

## 2024-02-13 DIAGNOSIS — I5022 Chronic systolic (congestive) heart failure: Secondary | ICD-10-CM | POA: Diagnosis not present

## 2024-02-13 DIAGNOSIS — R5381 Other malaise: Secondary | ICD-10-CM | POA: Diagnosis not present

## 2024-02-17 DIAGNOSIS — M545 Low back pain, unspecified: Secondary | ICD-10-CM | POA: Diagnosis not present

## 2024-02-17 DIAGNOSIS — M6281 Muscle weakness (generalized): Secondary | ICD-10-CM | POA: Diagnosis not present

## 2024-02-17 DIAGNOSIS — I5022 Chronic systolic (congestive) heart failure: Secondary | ICD-10-CM | POA: Diagnosis not present

## 2024-02-17 DIAGNOSIS — R5381 Other malaise: Secondary | ICD-10-CM | POA: Diagnosis not present

## 2024-02-21 DIAGNOSIS — R5381 Other malaise: Secondary | ICD-10-CM | POA: Diagnosis not present

## 2024-02-21 DIAGNOSIS — M545 Low back pain, unspecified: Secondary | ICD-10-CM | POA: Diagnosis not present

## 2024-02-21 DIAGNOSIS — I5022 Chronic systolic (congestive) heart failure: Secondary | ICD-10-CM | POA: Diagnosis not present

## 2024-02-21 DIAGNOSIS — M6281 Muscle weakness (generalized): Secondary | ICD-10-CM | POA: Diagnosis not present

## 2024-02-27 ENCOUNTER — Telehealth: Payer: Self-pay | Admitting: Pharmacist

## 2024-02-27 NOTE — Telephone Encounter (Signed)
 Pt's granddaughter calling wanting to know if pt's appt can be changed to a virtual due to her being pt's transportation and she just had a baby. Requesting cb

## 2024-02-27 NOTE — Telephone Encounter (Deleted)
[  T's granddaughter calling wanting to know if pt's appt can be

## 2024-02-28 DIAGNOSIS — I5022 Chronic systolic (congestive) heart failure: Secondary | ICD-10-CM | POA: Diagnosis not present

## 2024-02-28 DIAGNOSIS — M6281 Muscle weakness (generalized): Secondary | ICD-10-CM | POA: Diagnosis not present

## 2024-02-28 DIAGNOSIS — R5381 Other malaise: Secondary | ICD-10-CM | POA: Diagnosis not present

## 2024-02-28 DIAGNOSIS — M545 Low back pain, unspecified: Secondary | ICD-10-CM | POA: Diagnosis not present

## 2024-02-28 NOTE — Telephone Encounter (Signed)
 I called and spoke with patient's grandaughter. Patient is not going to qualify for GLP-1. He AHI is not >15, no MI, CVA or PAD, no DM. I did offer to do apt over the phone but stated that would not be able to get med approved through insurance. Cash price options too expensive. Granddaughter opted to cancel apt.

## 2024-03-01 DIAGNOSIS — R5381 Other malaise: Secondary | ICD-10-CM | POA: Diagnosis not present

## 2024-03-01 DIAGNOSIS — M6281 Muscle weakness (generalized): Secondary | ICD-10-CM | POA: Diagnosis not present

## 2024-03-01 DIAGNOSIS — M545 Low back pain, unspecified: Secondary | ICD-10-CM | POA: Diagnosis not present

## 2024-03-01 DIAGNOSIS — I5022 Chronic systolic (congestive) heart failure: Secondary | ICD-10-CM | POA: Diagnosis not present

## 2024-03-02 ENCOUNTER — Ambulatory Visit: Admitting: Pharmacist

## 2024-03-05 DIAGNOSIS — I5022 Chronic systolic (congestive) heart failure: Secondary | ICD-10-CM | POA: Diagnosis not present

## 2024-03-05 DIAGNOSIS — R5381 Other malaise: Secondary | ICD-10-CM | POA: Diagnosis not present

## 2024-03-05 DIAGNOSIS — M6281 Muscle weakness (generalized): Secondary | ICD-10-CM | POA: Diagnosis not present

## 2024-03-05 DIAGNOSIS — M545 Low back pain, unspecified: Secondary | ICD-10-CM | POA: Diagnosis not present

## 2024-03-07 DIAGNOSIS — M545 Low back pain, unspecified: Secondary | ICD-10-CM | POA: Diagnosis not present

## 2024-03-07 DIAGNOSIS — I5022 Chronic systolic (congestive) heart failure: Secondary | ICD-10-CM | POA: Diagnosis not present

## 2024-03-07 DIAGNOSIS — R5381 Other malaise: Secondary | ICD-10-CM | POA: Diagnosis not present

## 2024-03-07 DIAGNOSIS — M6281 Muscle weakness (generalized): Secondary | ICD-10-CM | POA: Diagnosis not present

## 2024-03-08 DIAGNOSIS — I959 Hypotension, unspecified: Secondary | ICD-10-CM | POA: Diagnosis not present

## 2024-03-08 DIAGNOSIS — Z13220 Encounter for screening for lipoid disorders: Secondary | ICD-10-CM | POA: Diagnosis not present

## 2024-03-08 DIAGNOSIS — Z0001 Encounter for general adult medical examination with abnormal findings: Secondary | ICD-10-CM | POA: Diagnosis not present

## 2024-03-08 DIAGNOSIS — E039 Hypothyroidism, unspecified: Secondary | ICD-10-CM | POA: Diagnosis not present

## 2024-03-08 DIAGNOSIS — N1831 Chronic kidney disease, stage 3a: Secondary | ICD-10-CM | POA: Diagnosis not present

## 2024-03-13 DIAGNOSIS — I5022 Chronic systolic (congestive) heart failure: Secondary | ICD-10-CM | POA: Diagnosis not present

## 2024-03-13 DIAGNOSIS — M545 Low back pain, unspecified: Secondary | ICD-10-CM | POA: Diagnosis not present

## 2024-03-13 DIAGNOSIS — R5381 Other malaise: Secondary | ICD-10-CM | POA: Diagnosis not present

## 2024-03-13 DIAGNOSIS — M6281 Muscle weakness (generalized): Secondary | ICD-10-CM | POA: Diagnosis not present

## 2024-03-16 DIAGNOSIS — R5381 Other malaise: Secondary | ICD-10-CM | POA: Diagnosis not present

## 2024-03-16 DIAGNOSIS — M6281 Muscle weakness (generalized): Secondary | ICD-10-CM | POA: Diagnosis not present

## 2024-03-16 DIAGNOSIS — I5022 Chronic systolic (congestive) heart failure: Secondary | ICD-10-CM | POA: Diagnosis not present

## 2024-03-16 DIAGNOSIS — M545 Low back pain, unspecified: Secondary | ICD-10-CM | POA: Diagnosis not present

## 2024-03-21 DIAGNOSIS — R5381 Other malaise: Secondary | ICD-10-CM | POA: Diagnosis not present

## 2024-03-21 DIAGNOSIS — M6281 Muscle weakness (generalized): Secondary | ICD-10-CM | POA: Diagnosis not present

## 2024-03-21 DIAGNOSIS — I5022 Chronic systolic (congestive) heart failure: Secondary | ICD-10-CM | POA: Diagnosis not present

## 2024-03-21 DIAGNOSIS — M545 Low back pain, unspecified: Secondary | ICD-10-CM | POA: Diagnosis not present

## 2024-03-23 DIAGNOSIS — I5022 Chronic systolic (congestive) heart failure: Secondary | ICD-10-CM | POA: Diagnosis not present

## 2024-03-23 DIAGNOSIS — R5381 Other malaise: Secondary | ICD-10-CM | POA: Diagnosis not present

## 2024-03-23 DIAGNOSIS — M545 Low back pain, unspecified: Secondary | ICD-10-CM | POA: Diagnosis not present

## 2024-03-23 DIAGNOSIS — M6281 Muscle weakness (generalized): Secondary | ICD-10-CM | POA: Diagnosis not present

## 2024-03-28 DIAGNOSIS — M545 Low back pain, unspecified: Secondary | ICD-10-CM | POA: Diagnosis not present

## 2024-03-28 DIAGNOSIS — M6281 Muscle weakness (generalized): Secondary | ICD-10-CM | POA: Diagnosis not present

## 2024-03-28 DIAGNOSIS — R5381 Other malaise: Secondary | ICD-10-CM | POA: Diagnosis not present

## 2024-03-28 DIAGNOSIS — I5022 Chronic systolic (congestive) heart failure: Secondary | ICD-10-CM | POA: Diagnosis not present

## 2024-03-30 DIAGNOSIS — M545 Low back pain, unspecified: Secondary | ICD-10-CM | POA: Diagnosis not present

## 2024-03-30 DIAGNOSIS — I5022 Chronic systolic (congestive) heart failure: Secondary | ICD-10-CM | POA: Diagnosis not present

## 2024-03-30 DIAGNOSIS — M6281 Muscle weakness (generalized): Secondary | ICD-10-CM | POA: Diagnosis not present

## 2024-03-30 DIAGNOSIS — R5381 Other malaise: Secondary | ICD-10-CM | POA: Diagnosis not present

## 2024-04-04 DIAGNOSIS — M6281 Muscle weakness (generalized): Secondary | ICD-10-CM | POA: Diagnosis not present

## 2024-04-04 DIAGNOSIS — R5381 Other malaise: Secondary | ICD-10-CM | POA: Diagnosis not present

## 2024-04-04 DIAGNOSIS — M545 Low back pain, unspecified: Secondary | ICD-10-CM | POA: Diagnosis not present

## 2024-04-04 DIAGNOSIS — I5022 Chronic systolic (congestive) heart failure: Secondary | ICD-10-CM | POA: Diagnosis not present

## 2024-04-06 DIAGNOSIS — R5381 Other malaise: Secondary | ICD-10-CM | POA: Diagnosis not present

## 2024-04-06 DIAGNOSIS — M545 Low back pain, unspecified: Secondary | ICD-10-CM | POA: Diagnosis not present

## 2024-04-06 DIAGNOSIS — E039 Hypothyroidism, unspecified: Secondary | ICD-10-CM | POA: Diagnosis not present

## 2024-04-06 DIAGNOSIS — Z0001 Encounter for general adult medical examination with abnormal findings: Secondary | ICD-10-CM | POA: Diagnosis not present

## 2024-04-06 DIAGNOSIS — M6281 Muscle weakness (generalized): Secondary | ICD-10-CM | POA: Diagnosis not present

## 2024-04-06 DIAGNOSIS — I480 Paroxysmal atrial fibrillation: Secondary | ICD-10-CM | POA: Diagnosis not present

## 2024-04-06 DIAGNOSIS — Z6841 Body Mass Index (BMI) 40.0 and over, adult: Secondary | ICD-10-CM | POA: Diagnosis not present

## 2024-04-06 DIAGNOSIS — I5022 Chronic systolic (congestive) heart failure: Secondary | ICD-10-CM | POA: Diagnosis not present

## 2024-04-11 DIAGNOSIS — I5022 Chronic systolic (congestive) heart failure: Secondary | ICD-10-CM | POA: Diagnosis not present

## 2024-04-11 DIAGNOSIS — M6281 Muscle weakness (generalized): Secondary | ICD-10-CM | POA: Diagnosis not present

## 2024-04-11 DIAGNOSIS — R5381 Other malaise: Secondary | ICD-10-CM | POA: Diagnosis not present

## 2024-04-11 DIAGNOSIS — M545 Low back pain, unspecified: Secondary | ICD-10-CM | POA: Diagnosis not present

## 2024-04-18 DIAGNOSIS — M545 Low back pain, unspecified: Secondary | ICD-10-CM | POA: Diagnosis not present

## 2024-04-18 DIAGNOSIS — R5381 Other malaise: Secondary | ICD-10-CM | POA: Diagnosis not present

## 2024-04-18 DIAGNOSIS — M6281 Muscle weakness (generalized): Secondary | ICD-10-CM | POA: Diagnosis not present

## 2024-04-18 DIAGNOSIS — I5022 Chronic systolic (congestive) heart failure: Secondary | ICD-10-CM | POA: Diagnosis not present

## 2024-04-25 DIAGNOSIS — R5381 Other malaise: Secondary | ICD-10-CM | POA: Diagnosis not present

## 2024-04-25 DIAGNOSIS — M545 Low back pain, unspecified: Secondary | ICD-10-CM | POA: Diagnosis not present

## 2024-04-25 DIAGNOSIS — M6281 Muscle weakness (generalized): Secondary | ICD-10-CM | POA: Diagnosis not present

## 2024-04-25 DIAGNOSIS — I5022 Chronic systolic (congestive) heart failure: Secondary | ICD-10-CM | POA: Diagnosis not present

## 2024-05-02 DIAGNOSIS — I5022 Chronic systolic (congestive) heart failure: Secondary | ICD-10-CM | POA: Diagnosis not present

## 2024-05-02 DIAGNOSIS — R5381 Other malaise: Secondary | ICD-10-CM | POA: Diagnosis not present

## 2024-05-02 DIAGNOSIS — M545 Low back pain, unspecified: Secondary | ICD-10-CM | POA: Diagnosis not present

## 2024-05-02 DIAGNOSIS — M6281 Muscle weakness (generalized): Secondary | ICD-10-CM | POA: Diagnosis not present

## 2024-05-17 DIAGNOSIS — Z1231 Encounter for screening mammogram for malignant neoplasm of breast: Secondary | ICD-10-CM | POA: Diagnosis not present

## 2024-05-28 DIAGNOSIS — Z853 Personal history of malignant neoplasm of breast: Secondary | ICD-10-CM | POA: Diagnosis not present

## 2024-05-28 DIAGNOSIS — I110 Hypertensive heart disease with heart failure: Secondary | ICD-10-CM | POA: Diagnosis not present

## 2024-05-28 DIAGNOSIS — I5022 Chronic systolic (congestive) heart failure: Secondary | ICD-10-CM | POA: Diagnosis not present

## 2024-05-28 DIAGNOSIS — I482 Chronic atrial fibrillation, unspecified: Secondary | ICD-10-CM | POA: Diagnosis not present

## 2024-06-06 ENCOUNTER — Other Ambulatory Visit (HOSPITAL_COMMUNITY): Payer: Self-pay | Admitting: Internal Medicine

## 2024-07-02 ENCOUNTER — Other Ambulatory Visit (HOSPITAL_COMMUNITY): Payer: Self-pay | Admitting: Internal Medicine

## 2024-07-24 ENCOUNTER — Other Ambulatory Visit (HOSPITAL_COMMUNITY): Payer: Self-pay | Admitting: *Deleted

## 2024-07-24 DIAGNOSIS — I5022 Chronic systolic (congestive) heart failure: Secondary | ICD-10-CM

## 2024-09-06 ENCOUNTER — Ambulatory Visit (HOSPITAL_COMMUNITY): Admission: RE | Admit: 2024-09-06 | Source: Ambulatory Visit

## 2024-09-06 ENCOUNTER — Ambulatory Visit (HOSPITAL_COMMUNITY): Admitting: Internal Medicine

## 2024-12-13 ENCOUNTER — Other Ambulatory Visit (HOSPITAL_COMMUNITY)

## 2024-12-13 ENCOUNTER — Ambulatory Visit (HOSPITAL_COMMUNITY): Admitting: Internal Medicine
